# Patient Record
Sex: Female | Born: 1955
Health system: Southern US, Community
[De-identification: ages and names within clinical notes are randomized; demographics above are authoritative.]

## PROBLEM LIST (undated history)

## (undated) DIAGNOSIS — F32A Depression, unspecified: Secondary | ICD-10-CM

## (undated) DIAGNOSIS — I10 Essential (primary) hypertension: Secondary | ICD-10-CM

## (undated) DIAGNOSIS — E785 Hyperlipidemia, unspecified: Secondary | ICD-10-CM

## (undated) DIAGNOSIS — G8929 Other chronic pain: Secondary | ICD-10-CM

## (undated) DIAGNOSIS — M549 Dorsalgia, unspecified: Secondary | ICD-10-CM

## (undated) DIAGNOSIS — F329 Major depressive disorder, single episode, unspecified: Secondary | ICD-10-CM

## (undated) DIAGNOSIS — M542 Cervicalgia: Secondary | ICD-10-CM

## (undated) HISTORY — PX: BREAST CYST EXCISION: SHX579

## (undated) HISTORY — DX: Hyperlipidemia, unspecified: E78.5

## (undated) HISTORY — PX: TUBAL LIGATION: SHX77

## (undated) HISTORY — PX: COLONOSCOPY: SHX174

## (undated) HISTORY — DX: Major depressive disorder, single episode, unspecified: F32.9

## (undated) HISTORY — DX: Essential (primary) hypertension: I10

## (undated) HISTORY — DX: Depression, unspecified: F32.A

---

## 1998-12-04 ENCOUNTER — Emergency Department (HOSPITAL_COMMUNITY): Admission: EM | Admit: 1998-12-04 | Discharge: 1998-12-05 | Payer: Self-pay | Admitting: Emergency Medicine

## 2001-02-12 ENCOUNTER — Other Ambulatory Visit: Admission: RE | Admit: 2001-02-12 | Discharge: 2001-02-12 | Payer: Self-pay | Admitting: Family Medicine

## 2001-12-16 ENCOUNTER — Encounter: Payer: Self-pay | Admitting: Family Medicine

## 2001-12-16 ENCOUNTER — Ambulatory Visit (HOSPITAL_COMMUNITY): Admission: RE | Admit: 2001-12-16 | Discharge: 2001-12-16 | Payer: Self-pay | Admitting: Family Medicine

## 2002-02-04 ENCOUNTER — Emergency Department (HOSPITAL_COMMUNITY): Admission: EM | Admit: 2002-02-04 | Discharge: 2002-02-04 | Payer: Self-pay | Admitting: Emergency Medicine

## 2002-03-05 ENCOUNTER — Other Ambulatory Visit: Admission: RE | Admit: 2002-03-05 | Discharge: 2002-03-05 | Payer: Self-pay | Admitting: Obstetrics and Gynecology

## 2008-05-26 ENCOUNTER — Emergency Department (HOSPITAL_COMMUNITY): Admission: EM | Admit: 2008-05-26 | Discharge: 2008-05-26 | Payer: Self-pay | Admitting: Emergency Medicine

## 2008-07-26 ENCOUNTER — Other Ambulatory Visit: Admission: RE | Admit: 2008-07-26 | Discharge: 2008-07-26 | Payer: Self-pay | Admitting: Obstetrics and Gynecology

## 2008-08-08 ENCOUNTER — Ambulatory Visit (HOSPITAL_COMMUNITY): Admission: RE | Admit: 2008-08-08 | Discharge: 2008-08-08 | Payer: Self-pay | Admitting: Obstetrics and Gynecology

## 2009-07-11 ENCOUNTER — Emergency Department (HOSPITAL_COMMUNITY): Admission: EM | Admit: 2009-07-11 | Discharge: 2009-07-11 | Payer: Self-pay | Admitting: Emergency Medicine

## 2011-01-25 LAB — URINE MICROSCOPIC-ADD ON

## 2011-01-25 LAB — URINALYSIS, ROUTINE W REFLEX MICROSCOPIC
Bilirubin Urine: NEGATIVE
Glucose, UA: NEGATIVE mg/dL
Ketones, ur: NEGATIVE mg/dL
Nitrite: NEGATIVE
Protein, ur: NEGATIVE mg/dL
Specific Gravity, Urine: 1.01 (ref 1.005–1.030)
Urobilinogen, UA: 0.2 mg/dL (ref 0.0–1.0)
pH: 6 (ref 5.0–8.0)

## 2011-01-25 LAB — WET PREP, GENITAL: Trich, Wet Prep: NONE SEEN

## 2011-01-25 LAB — GC/CHLAMYDIA PROBE AMP, GENITAL: Chlamydia, DNA Probe: NEGATIVE

## 2011-01-25 LAB — URINE CULTURE

## 2011-03-05 NOTE — Op Note (Signed)
[name], [name]             ACCOUNT NO.:  [id number]   MEDICAL RECORD NO.:  [id number]          PATIENT TYPE:  AMB   LOCATION:  DAY                           FACILITY:  [facility]   PHYSICIAN:  Tilda Burrow, M.D. DATE OF BIRTH:  1956/05/08   DATE OF PROCEDURE:  DATE OF DISCHARGE:                               OPERATIVE REPORT   PREOPERATIVE DIAGNOSES:  1. Menorrhagia.  2. Anemia.   POSTOPERATIVE DIAGNOSES:  1. Menorrhagia.  2. Anemia.   PROCEDURES:  Hysteroscopy and endometrial ablation (No dilation and  curettage required)   SURGEON:  Tilda Burrow, MD   ASSISTANT:  None.   ANESTHESIA:  General with endotracheal intubation, Dillard Cannon.   COMPLICATIONS:  None.   FINDINGS:  Smooth atrophic endometrial cavity with no tissue  abnormalities suspected, prior endometrial biopsy benign through the  office details.   PROCEDURE IN DETAIL:  The patient was taken to the operating room,  prepped and draped for vaginal procedure.  Time-out was conducted in  standard fashion.  Cervix was grasped with single-tooth tenaculum.  Uterus sounded to 12 cm with the cervix dilated to 23-French allowing  introduction of the rigid 30-degree hysteroscope for endometrial  evaluation.  The patient had easy identification of both tubal ostia,  and the endometrial cavity was quite smooth.  The patient was on  progesterone to suppress the endometrium.   The Gynecare ThermaChoice III endometrial ablation device was inserted  and insufflated with 25 mL of D5W, and the 8-minute thermal the  coagulation sequence was activated with good satisfactory return of all  25 mL of fluid.  The 8-minute sequence was completed without difficulty  with good intrauterine pressures throughout.  We have  then placed paracervical block consisting of 17 mL of Marcaine with  epinephrine placed in the paracervical tissues and the patient then was  allowed to go to the recovery room in good condition.  Sponge  and needle  counts were correct.      Tilda Burrow, M.D.  Electronically Signed     JVF/MEDQ  D:  08/08/2008  T:  08/09/2008  Job:  [id number]   cc:   [facility]

## 2011-07-19 LAB — HEMOGLOBIN AND HEMATOCRIT, BLOOD: Hemoglobin: 11.2 — ABNORMAL LOW

## 2011-07-22 LAB — BASIC METABOLIC PANEL
BUN: 6
CO2: 23
Chloride: 110
Creatinine, Ser: 0.96
Glucose, Bld: 102 — ABNORMAL HIGH

## 2011-07-22 LAB — CBC
MCHC: 32.2
MCV: 78
Platelets: 355
RBC: 3.81 — ABNORMAL LOW
RDW: 16.7 — ABNORMAL HIGH

## 2011-07-22 LAB — HCG, QUANTITATIVE, PREGNANCY: hCG, Beta Chain, Quant, S: <2

## 2011-10-22 DIAGNOSIS — G8929 Other chronic pain: Secondary | ICD-10-CM

## 2011-10-22 HISTORY — DX: Other chronic pain: G89.29

## 2012-04-01 ENCOUNTER — Emergency Department (HOSPITAL_COMMUNITY): Payer: No Typology Code available for payment source

## 2012-04-01 ENCOUNTER — Encounter (HOSPITAL_COMMUNITY): Payer: Self-pay | Admitting: *Deleted

## 2012-04-01 ENCOUNTER — Emergency Department (HOSPITAL_COMMUNITY)
Admission: EM | Admit: 2012-04-01 | Discharge: 2012-04-01 | Disposition: A | Discharge status: Home / Self Care | Payer: No Typology Code available for payment source | Attending: Emergency Medicine | Admitting: Emergency Medicine

## 2012-04-01 DIAGNOSIS — M542 Cervicalgia: Secondary | ICD-10-CM | POA: Diagnosis not present

## 2012-04-01 DIAGNOSIS — Y92410 Unspecified street and highway as the place of occurrence of the external cause: Secondary | ICD-10-CM | POA: Insufficient documentation

## 2012-04-01 DIAGNOSIS — M549 Dorsalgia, unspecified: Secondary | ICD-10-CM | POA: Insufficient documentation

## 2012-04-01 DIAGNOSIS — Z043 Encounter for examination and observation following other accident: Secondary | ICD-10-CM | POA: Diagnosis not present

## 2012-04-01 MED ORDER — KETOROLAC TROMETHAMINE 60 MG/2ML IM SOLN
60.0000 mg | Freq: Once | INTRAMUSCULAR | Status: AC
  Administered 2012-04-01: 60 mg via INTRAMUSCULAR
  Filled 2012-04-01: qty 2

## 2012-04-01 MED ORDER — IBUPROFEN 800 MG PO TABS
800.0000 mg | ORAL_TABLET | Freq: Once | ORAL | Status: AC
  Administered 2012-04-01: 800 mg via ORAL
  Filled 2012-04-01: qty 1

## 2012-04-01 MED ORDER — NAPROXEN 375 MG PO TABS: 375.0000 mg | ORAL_TABLET | Freq: Two times a day (BID) | ORAL | Status: DC

## 2012-04-01 MED ORDER — ACETAMINOPHEN 325 MG PO TABS
650.0000 mg | ORAL_TABLET | Freq: Once | ORAL | Status: AC
  Administered 2012-04-01: 650 mg via ORAL
  Filled 2012-04-01: qty 2

## 2012-04-01 MED ORDER — CYCLOBENZAPRINE HCL 10 MG PO TABS: 10.0000 mg | ORAL_TABLET | Freq: Two times a day (BID) | ORAL | Status: AC | PRN
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
## 2012-04-01 NOTE — ED Notes (Signed)
C/o back of neck pain at this time, pt did not rate when asked

## 2012-04-01 NOTE — Discharge Instructions (Signed)
X-rays were normal.  You'll be sore for several days.  Medication for pain and muscle spasm. °
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
## 2012-04-16 ENCOUNTER — Ambulatory Visit (INDEPENDENT_AMBULATORY_CARE_PROVIDER_SITE_OTHER): Payer: Medicare Other | Admitting: Family Medicine

## 2012-04-16 ENCOUNTER — Encounter: Payer: Self-pay | Admitting: Family Medicine

## 2012-04-16 VITALS — BP 150/96 | HR 76 | Resp 16 | Ht 61.5 in | Wt 253.4 lb

## 2012-04-16 DIAGNOSIS — F329 Major depressive disorder, single episode, unspecified: Secondary | ICD-10-CM | POA: Insufficient documentation

## 2012-04-16 DIAGNOSIS — R030 Elevated blood-pressure reading, without diagnosis of hypertension: Secondary | ICD-10-CM | POA: Diagnosis not present

## 2012-04-16 DIAGNOSIS — E669 Obesity, unspecified: Secondary | ICD-10-CM

## 2012-04-16 DIAGNOSIS — F3289 Other specified depressive episodes: Secondary | ICD-10-CM

## 2012-04-16 DIAGNOSIS — S161XXA Strain of muscle, fascia and tendon at neck level, initial encounter: Secondary | ICD-10-CM | POA: Insufficient documentation

## 2012-04-16 DIAGNOSIS — M549 Dorsalgia, unspecified: Secondary | ICD-10-CM | POA: Insufficient documentation

## 2012-04-16 DIAGNOSIS — F32A Depression, unspecified: Secondary | ICD-10-CM

## 2012-04-16 DIAGNOSIS — I10 Essential (primary) hypertension: Secondary | ICD-10-CM | POA: Insufficient documentation

## 2012-04-16 MED ORDER — CYCLOBENZAPRINE HCL 10 MG PO TABS: 10.0000 mg | ORAL_TABLET | Freq: Two times a day (BID) | ORAL | Status: DC | PRN

## 2012-04-16 MED ORDER — CITALOPRAM HYDROBROMIDE 20 MG PO TABS: 20.0000 mg | ORAL_TABLET | Freq: Every day | ORAL | Status: DC

## 2012-04-16 MED ORDER — NAPROXEN 375 MG PO TABS: 375.0000 mg | ORAL_TABLET | Freq: Two times a day (BID) | ORAL | Status: DC
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
## 2012-04-16 NOTE — Assessment & Plan Note (Signed)
Cervical strain she has a lot of spasm and tightness in her neck. Per above with muscle relaxants. CT scan reviewed no acute findings. She may benefit from physical therapy

## 2012-04-16 NOTE — Assessment & Plan Note (Signed)
This is her first visit but it appears that her depression has deteriorated off her medication. We'll restart her on her Celexa 20 mg her records will be obtained.

## 2012-04-16 NOTE — Assessment & Plan Note (Signed)
Given handout on low calorie diet encouraged her to walk as much as possible.
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
## 2012-04-27 ENCOUNTER — Telehealth: Payer: Self-pay | Admitting: Family Medicine

## 2012-04-27 DIAGNOSIS — S161XXA Strain of muscle, fascia and tendon at neck level, initial encounter: Secondary | ICD-10-CM

## 2012-04-27 DIAGNOSIS — M549 Dorsalgia, unspecified: Secondary | ICD-10-CM

## 2012-04-27 NOTE — Telephone Encounter (Signed)
Referral placed.

## 2012-05-06 ENCOUNTER — Ambulatory Visit (HOSPITAL_COMMUNITY)
Admission: RE | Admit: 2012-05-06 | Discharge: 2012-05-06 | Disposition: A | Discharge status: Home / Self Care | Payer: Medicare Other | Source: Ambulatory Visit | Attending: Family Medicine | Admitting: Family Medicine

## 2012-05-06 DIAGNOSIS — R531 Weakness: Secondary | ICD-10-CM | POA: Insufficient documentation

## 2012-05-06 DIAGNOSIS — M545 Low back pain, unspecified: Secondary | ICD-10-CM | POA: Diagnosis not present

## 2012-05-06 DIAGNOSIS — R262 Difficulty in walking, not elsewhere classified: Secondary | ICD-10-CM | POA: Diagnosis not present

## 2012-05-06 DIAGNOSIS — M2560 Stiffness of unspecified joint, not elsewhere classified: Secondary | ICD-10-CM | POA: Insufficient documentation

## 2012-05-06 DIAGNOSIS — IMO0001 Reserved for inherently not codable concepts without codable children: Secondary | ICD-10-CM | POA: Insufficient documentation

## 2012-05-06 DIAGNOSIS — M25519 Pain in unspecified shoulder: Secondary | ICD-10-CM | POA: Insufficient documentation

## 2012-05-06 DIAGNOSIS — M542 Cervicalgia: Secondary | ICD-10-CM | POA: Insufficient documentation

## 2012-05-06 DIAGNOSIS — M6281 Muscle weakness (generalized): Secondary | ICD-10-CM | POA: Insufficient documentation
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
## 2012-05-12 ENCOUNTER — Ambulatory Visit (HOSPITAL_COMMUNITY)
Admission: RE | Admit: 2012-05-12 | Discharge: 2012-05-12 | Disposition: A | Discharge status: Home / Self Care | Payer: Medicare Other | Source: Ambulatory Visit | Attending: Family Medicine | Admitting: Family Medicine

## 2012-05-12 DIAGNOSIS — R531 Weakness: Secondary | ICD-10-CM

## 2012-05-12 DIAGNOSIS — M2560 Stiffness of unspecified joint, not elsewhere classified: Secondary | ICD-10-CM

## 2012-05-12 DIAGNOSIS — R262 Difficulty in walking, not elsewhere classified: Secondary | ICD-10-CM

## 2012-05-12 DIAGNOSIS — IMO0001 Reserved for inherently not codable concepts without codable children: Secondary | ICD-10-CM | POA: Diagnosis not present

## 2012-05-12 NOTE — Progress Notes (Signed)
Physical Therapy Treatment Patient Details  Name: Sheryl [name] MRN: [id number] Date of Birth: [birth year]  Today's Date: 05/12/2012 Time: 0800-0846 PT Time Calculation (min): 46 min  Visit#: 2  of 8   Re-eval: 06/05/12    Authorization: medicare  Authorization Time Period:    Authorization Visit#:   of     Subjective: Symptoms/Limitations Symptoms: Ms. [place] states she has been doing her exercises but not as often as she was suppose to. Pain Assessment Pain Score:   5 Pain Location: Neck Pain Orientation: Right;Left  Precautions/Restrictions     Exercise/Treatments     Aerobic Tread Mill: 1.0 x 5' UBE (Upper Arm Bike): 1.0x 3'    Standing Other Standing Lumbar Exercises: T-band posture green x 110for all postural ex  Seated Other Seated Lumbar Exercises: c-SB/Rot AROM Other Seated Lumbar Exercises: isometric sb/ext x 10; w-back x 10 Supine Ab Set: 10 reps Bent Knee Raise: 10 reps Bridge: 10 reps      Physical Therapy Assessment and Plan PT Assessment and Plan Clinical Impression Statement: Pt needed verbal and tactile cuing to properly stabilize both cervical and lumbar areas.  Added ex per doc flow sheet.  Rehab Potential: Good PT Plan: Begin heel raise, minisquat, hip isometric and x to v next treatment; 4th treatment add wall push up and floating SLR, 5th heelsqueeze and prone slr     Goals    Problem List Patient Active Problem List  Diagnosis  . Obesity, unspecified  . Depression  . Elevated blood pressure reading without diagnosis of hypertension  . Back pain  . Cervical strain, acute  . Difficulty in walking  . Stiffness of vertebral column  . Decreased strength    PT - End of Session Activity Tolerance: Patient tolerated treatment well General Behavior During Session: [facility] for tasks performed Cognition: [facility] for tasks performed PT Plan of Care PT Home Exercise Plan: given new exercises as well as ex to add for next  treatment Consulted and Agree with Plan of Care: Patient  GP    Rickardo Brinegar,CINDY 05/12/2012, 8:54 AM

## 2012-05-14 ENCOUNTER — Ambulatory Visit (HOSPITAL_COMMUNITY)
Admission: RE | Admit: 2012-05-14 | Discharge: 2012-05-14 | Disposition: A | Discharge status: Home / Self Care | Payer: Medicare Other | Source: Ambulatory Visit | Attending: Family Medicine | Admitting: Family Medicine

## 2012-05-14 DIAGNOSIS — IMO0001 Reserved for inherently not codable concepts without codable children: Secondary | ICD-10-CM | POA: Diagnosis not present

## 2012-05-14 NOTE — Progress Notes (Addendum)
Physical Therapy Treatment Patient Details  Name: Sheryl [name] MRN: [id number] Date of Birth: 10-04-1956  Today's Date: 05/14/2012 Time: 0805-0844 PT Time Calculation (min): 39 min  Visit#: 3  of 8   Re-eval: 06/05/12    Authorization:    Authorization Time Period:    Authorization Visit#: 2  of 8    Subjective: Symptoms/Limitations Symptoms: Ms. Casimir c/o of tingling at base of cervical area-superior to shoulders and LBP 6/10 pain "constant aching"  Precautions/Restrictions     Exercise/Treatments Mobility/Balance             Aerobic Tread Mill: 1.0 x 5' UBE (Upper Arm Bike): 1.0x 3' Machines for Strengthening   Standing Heel Raises: 10 reps Other Standing Lumbar Exercises: T-band posture green x 10 Other Standing Lumbar Exercises: MS x10 Seated Other Seated Lumbar Exercises: c-SB/Rot AROM Other Seated Lumbar Exercises: isometric sb/ext x 10; w-back x 10; X to V x10 Supine Ab Set: 10 reps Bent Knee Raise: 10 reps Bridge: 10 reps Isometric Hip Flexion: 10 reps     Physical Therapy Assessment and Plan PT Assessment and Plan Clinical Impression Statement: Pt cautious and wincing with pain frequently with LBP during treatment. Patient had reported she was having difficulty with bent knee raises at home; educated patient on importance of Ab set before  performing and this alleviated pain today with these exercises...  Added exercises tolerated well with verbal cueing needed to set Ab before all exercises. add wall push up and floating SLR, 5th heelsqueeze and prone slr    Goals    Problem List Patient Active Problem List  Diagnosis  . Obesity, unspecified  . Depression  . Elevated blood pressure reading without diagnosis of hypertension  . Back pain  . Cervical strain, acute  . Difficulty in walking  . Stiffness of vertebral column  . Decreased strength    PT - End of Session Activity Tolerance: Patient limited by pain General Behavior  During Session: [facility] for tasks performed Cognition: [facility] for tasks performed  GP    Martavia Tye ATKINSO 05/14/2012, 9:26 AM

## 2012-05-19 ENCOUNTER — Inpatient Hospital Stay (HOSPITAL_COMMUNITY): Admission: RE | Admit: 2012-05-19 | Payer: Medicare Other | Source: Ambulatory Visit | Admitting: Physical Therapy

## 2012-05-20 ENCOUNTER — Ambulatory Visit (HOSPITAL_COMMUNITY)
Admission: RE | Admit: 2012-05-20 | Discharge: 2012-05-20 | Disposition: A | Discharge status: Home / Self Care | Payer: Medicare Other | Source: Ambulatory Visit | Attending: Family Medicine | Admitting: Family Medicine

## 2012-05-20 DIAGNOSIS — IMO0001 Reserved for inherently not codable concepts without codable children: Secondary | ICD-10-CM | POA: Diagnosis not present

## 2012-05-20 NOTE — Progress Notes (Signed)
Physical Therapy Treatment Patient Details  Name: Sheryl [name] MRN: [id number] Date of Birth: 20-May-1956  Today's Date: 05/20/2012 Time: 7829-5621 PT Time Calculation (min): 42 min Charges:  therex 40' Visit#: 4  of 8   Re-eval: 06/05/12    Authorization: medicare  Authorization Time Period:    Authorization Visit#: 3  of 8    Subjective: Symptoms/Limitations Symptoms: Pt. states "it's not that bad", yet rated her cervical pain 8/10 today. Pain Assessment Currently in Pain?: Yes Pain Score:   8 Pain Location: Neck Pain Orientation: Right;Left Pain Radiating Towards: L shoulder   Exercise/Treatments Stretches Active Hamstring Stretch: 2 reps;30 seconds Aerobic Tread Mill: 6' @ 1.2 mph UBE (Upper Arm Bike): 4'@ 1.0 Standing Heel Raises: 15 reps;Limitations Heel Raises Limitations: Toeraises 15 reps Other Standing Lumbar Exercises: T-band postural 3;  green x 10 Seated Other Seated Lumbar Exercises: 10 reps Cervical-SB/Rot AROM Other Seated Lumbar Exercises: isometric sb/ext x 10; w-back x 10; X to V x10 Supine Ab Set: 10 reps Bent Knee Raise: 10 reps Bridge: 10 reps Straight Leg Raise: 5 reps Isometric Hip Flexion: 10 reps    Physical Therapy Assessment and Plan PT Assessment and Plan Clinical Impression Statement: Added hamstring stretch to POC with difficulty due to UE strength; Pt. requires cues to perform exercises slowly and controlled.  Pt. able to demonstrate logroll when transitioning supine to sit. PT Plan: Add wall push ups and  minisquats next visit.    Problem List Patient Active Problem List  Diagnosis  . Obesity, unspecified  . Depression  . Elevated blood pressure reading without diagnosis of hypertension  . Back pain  . Cervical strain, acute  . Difficulty in walking  . Stiffness of vertebral column  . Decreased strength    PT - End of Session Activity Tolerance: Patient tolerated treatment well General Behavior During Session:  [facility] for tasks performed Cognition: [facility] for tasks performed   [name], PTA/CLT 05/20/2012, 4:44 PM

## 2012-05-21 ENCOUNTER — Ambulatory Visit (HOSPITAL_COMMUNITY): Payer: Medicare Other | Admitting: *Deleted

## 2012-05-26 ENCOUNTER — Ambulatory Visit (HOSPITAL_COMMUNITY)
Admission: RE | Admit: 2012-05-26 | Discharge: 2012-05-26 | Disposition: A | Discharge status: Home / Self Care | Payer: Medicare Other | Source: Ambulatory Visit | Attending: Family Medicine | Admitting: Family Medicine

## 2012-05-26 DIAGNOSIS — IMO0001 Reserved for inherently not codable concepts without codable children: Secondary | ICD-10-CM | POA: Insufficient documentation

## 2012-05-26 DIAGNOSIS — M545 Low back pain, unspecified: Secondary | ICD-10-CM | POA: Insufficient documentation

## 2012-05-26 DIAGNOSIS — M542 Cervicalgia: Secondary | ICD-10-CM | POA: Diagnosis not present

## 2012-05-26 DIAGNOSIS — R262 Difficulty in walking, not elsewhere classified: Secondary | ICD-10-CM | POA: Diagnosis not present

## 2012-05-26 DIAGNOSIS — M25519 Pain in unspecified shoulder: Secondary | ICD-10-CM | POA: Insufficient documentation

## 2012-05-26 DIAGNOSIS — M6281 Muscle weakness (generalized): Secondary | ICD-10-CM | POA: Diagnosis not present

## 2012-05-26 NOTE — Progress Notes (Signed)
Physical Therapy Treatment Patient Details  Name: Sheryl [name] MRN: [id number] Date of Birth: [birth year]  Today's Date: 05/26/2012 Time: 0805-0845 PT Time Calculation (min): 40 min Visit#: 5  of 8   Re-eval: 06/05/12 Charges:  therex 38'    Authorization: medicare  Authorization Visit#: 5  of 8    Subjective: Symptoms/Limitations Symptoms: Pt. reports she is having no real pain today; neck and back doing great. Pain Assessment Currently in Pain?: No/denies   Exercise/Treatments Aerobic Tread Mill: 6' @ 1.2 mph UBE (Upper Arm Bike): 4'@ 1.0 Standing Heel Raises: 15 reps;Limitations Heel Raises Limitations: Toeraises 15 reps Wall Slides: Limitations Wall Slides Limitations: wall push ups 5 reps Other Standing Lumbar Exercises: T-band postural 3;  green x 12 Other Standing Lumbar Exercises: squats 10 reps Seated Other Seated Lumbar Exercises: 10 reps Cervical-SB/Rot AROM Other Seated Lumbar Exercises: isometric sb/ext x 10; w-back x 10; X to V x10 Supine Ab Set: 15 reps;Limitations AB Set Limitations: 5" holds Bent Knee Raise: 10 reps Bridge: 10 reps Straight Leg Raise: 5 reps Isometric Hip Flexion: 10 reps    Physical Therapy Assessment and Plan PT Assessment and Plan Clinical Impression Statement: Added wall push ups and minisquats; pt c/o of early fatigue at 5 reps with goal of 10.   continues to require verbal cues to perform exercises more slowly/controlled. PT Plan: Continue X 3 more visits; reassess end of next week.  Progress reps as able.     Problem List Patient Active Problem List  Diagnosis  . Obesity, unspecified  . Depression  . Elevated blood pressure reading without diagnosis of hypertension  . Back pain  . Cervical strain, acute  . Difficulty in walking  . Stiffness of vertebral column  . Decreased strength    PT - End of Session Activity Tolerance: Patient tolerated treatment well General Behavior During Session: [facility] for tasks  performed Cognition: [name] for tasks performed   [name], PTA/CLT 05/26/2012, 8:49 AM

## 2012-05-28 ENCOUNTER — Encounter: Payer: Self-pay | Admitting: Family Medicine

## 2012-05-28 ENCOUNTER — Ambulatory Visit (INDEPENDENT_AMBULATORY_CARE_PROVIDER_SITE_OTHER): Payer: Medicare Other | Admitting: Family Medicine

## 2012-05-28 VITALS — BP 140/96 | HR 69 | Resp 16 | Ht 61.5 in | Wt 252.4 lb

## 2012-05-28 DIAGNOSIS — F329 Major depressive disorder, single episode, unspecified: Secondary | ICD-10-CM | POA: Diagnosis not present

## 2012-05-28 DIAGNOSIS — S139XXA Sprain of joints and ligaments of unspecified parts of neck, initial encounter: Secondary | ICD-10-CM | POA: Diagnosis not present

## 2012-05-28 DIAGNOSIS — S161XXA Strain of muscle, fascia and tendon at neck level, initial encounter: Secondary | ICD-10-CM

## 2012-05-28 DIAGNOSIS — F3289 Other specified depressive episodes: Secondary | ICD-10-CM

## 2012-05-28 DIAGNOSIS — F32A Depression, unspecified: Secondary | ICD-10-CM

## 2012-05-28 DIAGNOSIS — I10 Essential (primary) hypertension: Secondary | ICD-10-CM | POA: Diagnosis not present

## 2012-05-28 LAB — COMPREHENSIVE METABOLIC PANEL
ALT: 17 U/L (ref 0–35)
Alkaline Phosphatase: 91 U/L (ref 39–117)
CO2: 29 mEq/L (ref 19–32)
Creat: 1.04 mg/dL (ref 0.50–1.10)
Total Bilirubin: 0.5 mg/dL (ref 0.3–1.2)

## 2012-05-28 LAB — CBC
HCT: 40.8 % (ref 36.0–46.0)
MCV: 84.6 fL (ref 78.0–100.0)
RDW: 13 % (ref 11.5–15.5)
WBC: 6 10*3/uL (ref 4.0–10.5)

## 2012-05-28 LAB — LIPID PANEL
HDL: 41 mg/dL (ref >39)
LDL Cholesterol: 125 mg/dL — ABNORMAL HIGH (ref 0–99)
Total CHOL/HDL Ratio: 4.4 Ratio

## 2012-05-28 LAB — TSH: TSH: 0.904 u[IU]/mL (ref 0.350–4.500)

## 2012-05-28 MED ORDER — HYDROCHLOROTHIAZIDE 12.5 MG PO TABS: 12.5000 mg | ORAL_TABLET | Freq: Every day | ORAL | Status: DC

## 2012-05-28 MED ORDER — NAPROXEN 375 MG PO TABS: 375.0000 mg | ORAL_TABLET | Freq: Two times a day (BID) | ORAL | Status: AC

## 2012-05-28 MED ORDER — CYCLOBENZAPRINE HCL 10 MG PO TABS: 10.0000 mg | ORAL_TABLET | Freq: Two times a day (BID) | ORAL | Status: DC | PRN

## 2012-05-28 NOTE — Patient Instructions (Signed)
Start blood pressure medication once a  Day in morning Continue other medications Take Celexa every day Labs to be done today F/U 4 weeks

## 2012-05-28 NOTE — Progress Notes (Signed)
  Subjective:    Patient ID: Sheryl [name], female    DOB: 23-Apr-1956, 56 y.o.   MRN: [id number]  HPI Pt here to f/u blood pressure and depression. She has not been taking celexa as prescribed, her mood is low and she is worried because son is leaving for college and there are forms to fill out and she does not understand them, she will be meeting with his college counselor to fill these out. No SI, no hallucinations.    Review of Systems   GEN- denies fatigue, fever, weight loss,weakness, recent illness HEENT- denies eye drainage, change in vision, nasal discharge, CVS- denies chest pain, palpitations RESP- denies SOB, cough, wheeze ABD- denies N/V, change in stools, abd pain GU- denies dysuria, hematuria, dribbling, incontinence MSK- + joint pain, muscle aches, injury Neuro- denies headache, dizziness, syncope, seizure activity      Objective:   Physical Exam GEN- NAD, alert and oriented x3 CVS- RRR, no murmur RESP-CTAB EXT- No edema Pulses- Radial, DP- 2+ Psych- depressed affect, crying during exam, not overly anxious, no hallucinations, no apparent SI       Assessment & Plan:

## 2012-05-29 ENCOUNTER — Ambulatory Visit (HOSPITAL_COMMUNITY): Payer: Medicare Other | Admitting: Physical Therapy

## 2012-05-31 NOTE — Assessment & Plan Note (Addendum)
Start HCTZ daily, recheck in 4 weeks, labs to be obtained today

## 2012-05-31 NOTE — Assessment & Plan Note (Signed)
Given reassurance about the school forms, she is to take celexa daily to help mood, f/u 4 weeks

## 2012-05-31 NOTE — Assessment & Plan Note (Signed)
Improved, continue PT

## 2012-06-02 ENCOUNTER — Inpatient Hospital Stay (HOSPITAL_COMMUNITY): Admission: RE | Admit: 2012-06-02 | Payer: Medicare Other | Source: Ambulatory Visit

## 2012-06-04 ENCOUNTER — Ambulatory Visit (HOSPITAL_COMMUNITY): Payer: Medicare Other | Admitting: *Deleted

## 2012-06-09 ENCOUNTER — Ambulatory Visit (HOSPITAL_COMMUNITY)
Admission: RE | Admit: 2012-06-09 | Payer: Medicare Other | Source: Ambulatory Visit | Attending: Family Medicine | Admitting: Family Medicine

## 2012-07-02 ENCOUNTER — Ambulatory Visit: Payer: Medicare Other | Admitting: Family Medicine

## 2012-07-02 ENCOUNTER — Encounter: Payer: Self-pay | Admitting: Family Medicine

## 2012-07-09 ENCOUNTER — Other Ambulatory Visit: Payer: Self-pay | Admitting: Family Medicine

## 2012-08-26 ENCOUNTER — Telehealth: Payer: Self-pay | Admitting: Family Medicine

## 2012-08-26 NOTE — Telephone Encounter (Signed)
Phone out of service.

## 2012-08-27 NOTE — Telephone Encounter (Signed)
Called number again today and its still out of service

## 2012-08-27 NOTE — Telephone Encounter (Signed)
Phone still out of service

## 2013-02-12 ENCOUNTER — Telehealth: Payer: Self-pay | Admitting: Family Medicine

## 2013-02-12 NOTE — Telephone Encounter (Signed)
Are you saying she wants her whole medical record. Has to sign release

## 2013-06-11 ENCOUNTER — Emergency Department (HOSPITAL_COMMUNITY)
Admission: EM | Admit: 2013-06-11 | Discharge: 2013-06-11 | Disposition: A | Discharge status: Home / Self Care | Payer: Medicare Other | Attending: Emergency Medicine | Admitting: Emergency Medicine

## 2013-06-11 ENCOUNTER — Encounter (HOSPITAL_COMMUNITY): Payer: Self-pay

## 2013-06-11 DIAGNOSIS — R300 Dysuria: Secondary | ICD-10-CM | POA: Diagnosis not present

## 2013-06-11 DIAGNOSIS — Y929 Unspecified place or not applicable: Secondary | ICD-10-CM | POA: Insufficient documentation

## 2013-06-11 DIAGNOSIS — R350 Frequency of micturition: Secondary | ICD-10-CM | POA: Insufficient documentation

## 2013-06-11 DIAGNOSIS — F3289 Other specified depressive episodes: Secondary | ICD-10-CM | POA: Insufficient documentation

## 2013-06-11 DIAGNOSIS — F329 Major depressive disorder, single episode, unspecified: Secondary | ICD-10-CM | POA: Insufficient documentation

## 2013-06-11 DIAGNOSIS — R197 Diarrhea, unspecified: Secondary | ICD-10-CM | POA: Insufficient documentation

## 2013-06-11 DIAGNOSIS — Y939 Activity, unspecified: Secondary | ICD-10-CM | POA: Insufficient documentation

## 2013-06-11 DIAGNOSIS — T6391XA Toxic effect of contact with unspecified venomous animal, accidental (unintentional), initial encounter: Secondary | ICD-10-CM | POA: Insufficient documentation

## 2013-06-11 DIAGNOSIS — N390 Urinary tract infection, site not specified: Secondary | ICD-10-CM | POA: Diagnosis not present

## 2013-06-11 DIAGNOSIS — T63461A Toxic effect of venom of wasps, accidental (unintentional), initial encounter: Secondary | ICD-10-CM | POA: Insufficient documentation

## 2013-06-11 LAB — URINALYSIS, ROUTINE W REFLEX MICROSCOPIC
Bilirubin Urine: NEGATIVE
Ketones, ur: NEGATIVE mg/dL
Nitrite: NEGATIVE
Specific Gravity, Urine: >1.03 — ABNORMAL HIGH (ref 1.005–1.030)
Urobilinogen, UA: 0.2 mg/dL (ref 0.0–1.0)

## 2013-06-11 MED ORDER — DIPHENHYDRAMINE HCL 25 MG PO CAPS
25.0000 mg | ORAL_CAPSULE | Freq: Once | ORAL | Status: AC
  Administered 2013-06-11: 25 mg via ORAL
  Filled 2013-06-11: qty 1

## 2013-06-11 MED ORDER — CEPHALEXIN 500 MG PO CAPS: 500.0000 mg | ORAL_CAPSULE | Freq: Four times a day (QID) | ORAL | Status: DC

## 2013-06-11 NOTE — ED Notes (Signed)
Pt states she was mowing last night and got stung by bees

## 2013-06-11 NOTE — ED Provider Notes (Signed)
CSN: [id number]     Arrival date & time 06/11/13  4098 History    This chart was scribed for Joya Gaskins, MD by Blanchard Kelch, ED Scribe. The patient was seen in room APA01/APA01. Patient's care was started at 9:39 AM.    Chief Complaint  Patient presents with  . Insect Bite    The history is provided by the patient. No language interpreter was used.    HPI Comments: Sheryl [name] is a 57 y.o. female who presents to the Emergency Department complaining of a bee sting that occurred yesterday on right knee, left eye, left upper abdomen. Patient applied vinegar to bee stings without relief. She denies associated lip or tongue swelling, syncope or vomiting. Patient denies taking any medication for stings. Patient also complains of dysuria, urinary frequency and diarrhea recently.   Past Medical History  Diagnosis Date  . Depression    Past Surgical History  Procedure Laterality Date  . Breast cyst excision      right  . Tubal ligation     Family History  Problem Relation Age of Onset  . Hypertension Mother   . Depression Mother   . Hypertension Sister   . Hypertension Sister    History  Substance Use Topics  . Smoking status: Never Smoker   . Smokeless tobacco: Not on file  . Alcohol Use: No   OB History   Grav Para Term Preterm Abortions TAB SAB Ect Mult Living                 Review of Systems  Constitutional: Negative for fever.  Respiratory: Negative for shortness of breath.     Allergies  Review of patient's allergies indicates no known allergies.  Home Medications   Current Outpatient Rx  Name  Route  Sig  Dispense  Refill  . citalopram (CELEXA) 20 MG tablet   Oral   Take 1 tablet (20 mg total) by mouth daily.   30 tablet   3   . cyclobenzaprine (FLEXERIL) 10 MG tablet   Oral   Take 1 tablet (10 mg total) by mouth 2 (two) times daily as needed.   45 tablet   1   . hydrochlorothiazide (HYDRODIURIL) 12.5 MG tablet      TAKE 1 TABLET BY  MOUTH EVERY DAY   30 tablet   6    Triage Vitals: BP 146/73  Pulse 86  Temp(Src) 97.8 F (36.6 C) (Oral)  Resp 20  Ht 5\' 2"  (1.575 m)  Wt 278 lb (126.1 kg)  BMI 50.83 kg/m2  SpO2 98%  Physical Exam  CONSTITUTIONAL: Well developed/well nourished HEAD: Normocephalic/atraumatic EYES: EOMI/PERRL ENMT: Mucous membranes moist. No angiodema NECK: supple no meningeal signs CV: S1/S2 noted, no murmurs/rubs/gallops noted LUNGS: Lungs are clear to auscultation bilaterally, no apparent distress ABDOMEN: soft, nontender, no rebound or guarding GU:no cva tenderness NEURO: Pt is awake/alert, moves all extremitiesx4 EXTREMITIES: pulses normal, full ROM SKIN: warm, color normal. Small area of erythema to abdomen.  PSYCH: no abnormalities of mood noted   ED Course   DIAGNOSTIC STUDIES:  Oxygen Saturation is 98% on room air, normal by my interpretation.    COORDINATION OF CARE:  9:42 AM - Patient verbalizes understanding and agrees with treatment plan. uti noted will start keflex No signs of systemic allergic reaction.  She requested a dose of benadryl here   Procedures     MDM  Nursing notes including past medical history and social history reviewed and  considered in documentation Labs/vital reviewed and considered    I personally performed the services described in this documentation, which was scribed in my presence. The recorded information has been reviewed and is accurate.      Joya Gaskins, MD 06/11/13 [phone]

## 2013-07-05 ENCOUNTER — Encounter (HOSPITAL_COMMUNITY): Payer: Self-pay

## 2013-07-05 ENCOUNTER — Emergency Department (HOSPITAL_COMMUNITY)
Admission: EM | Admit: 2013-07-05 | Discharge: 2013-07-05 | Disposition: A | Discharge status: Home / Self Care | Payer: Medicare Other | Attending: Emergency Medicine | Admitting: Emergency Medicine

## 2013-07-05 ENCOUNTER — Emergency Department (HOSPITAL_COMMUNITY): Payer: Medicare Other

## 2013-07-05 DIAGNOSIS — J069 Acute upper respiratory infection, unspecified: Secondary | ICD-10-CM | POA: Diagnosis not present

## 2013-07-05 DIAGNOSIS — J029 Acute pharyngitis, unspecified: Secondary | ICD-10-CM | POA: Insufficient documentation

## 2013-07-05 DIAGNOSIS — M549 Dorsalgia, unspecified: Secondary | ICD-10-CM | POA: Diagnosis not present

## 2013-07-05 DIAGNOSIS — Z8659 Personal history of other mental and behavioral disorders: Secondary | ICD-10-CM | POA: Diagnosis not present

## 2013-07-05 DIAGNOSIS — R509 Fever, unspecified: Secondary | ICD-10-CM | POA: Insufficient documentation

## 2013-07-05 DIAGNOSIS — J209 Acute bronchitis, unspecified: Secondary | ICD-10-CM | POA: Insufficient documentation

## 2013-07-05 DIAGNOSIS — Z792 Long term (current) use of antibiotics: Secondary | ICD-10-CM | POA: Insufficient documentation

## 2013-07-05 DIAGNOSIS — R05 Cough: Secondary | ICD-10-CM | POA: Diagnosis not present

## 2013-07-05 DIAGNOSIS — R062 Wheezing: Secondary | ICD-10-CM | POA: Diagnosis not present

## 2013-07-05 DIAGNOSIS — IMO0001 Reserved for inherently not codable concepts without codable children: Secondary | ICD-10-CM | POA: Insufficient documentation

## 2013-07-05 DIAGNOSIS — J40 Bronchitis, not specified as acute or chronic: Secondary | ICD-10-CM

## 2013-07-05 MED ORDER — IPRATROPIUM BROMIDE 0.02 % IN SOLN
0.5000 mg | Freq: Once | RESPIRATORY_TRACT | Status: AC
  Administered 2013-07-05: 0.5 mg via RESPIRATORY_TRACT
  Filled 2013-07-05: qty 2.5

## 2013-07-05 MED ORDER — HYDROCOD POLST-CHLORPHEN POLST 10-8 MG/5ML PO LQCR: 5.0000 mL | Freq: Two times a day (BID) | ORAL | Status: DC | PRN

## 2013-07-05 MED ORDER — ALBUTEROL SULFATE (5 MG/ML) 0.5% IN NEBU
5.0000 mg | INHALATION_SOLUTION | Freq: Once | RESPIRATORY_TRACT | Status: AC
  Administered 2013-07-05: 5 mg via RESPIRATORY_TRACT
  Filled 2013-07-05: qty 1

## 2013-07-05 MED ORDER — ALBUTEROL SULFATE HFA 108 (90 BASE) MCG/ACT IN AERS
2.0000 | INHALATION_SPRAY | Freq: Once | RESPIRATORY_TRACT | Status: AC
  Administered 2013-07-05: 2 via RESPIRATORY_TRACT
  Filled 2013-07-05: qty 6.7

## 2013-07-05 MED ORDER — HYDROCOD POLST-CHLORPHEN POLST 10-8 MG/5ML PO LQCR
5.0000 mL | Freq: Once | ORAL | Status: AC
  Administered 2013-07-05: 5 mL via ORAL
  Filled 2013-07-05: qty 5

## 2013-07-05 MED ORDER — AZITHROMYCIN 250 MG PO TABS: ORAL_TABLET | ORAL | Status: DC

## 2013-07-05 NOTE — ED Provider Notes (Signed)
CSN: [id number]     Arrival date & time 07/05/13  7829 History   First MD Initiated Contact with Patient 07/05/13 [phone]     Chief Complaint  Patient presents with  . URI   (Consider location/radiation/quality/duration/timing/severity/associated sxs/prior Treatment) Patient is a 57 y.o. female presenting with URI. The history is provided by the patient.  URI Presenting symptoms: congestion, cough, fever, rhinorrhea and sore throat   Presenting symptoms: no ear pain, no facial pain and no fatigue   Congestion:    Location:  Nasal and chest   Interferes with sleep: no     Interferes with eating/drinking: no   Cough:    Cough characteristics:  Productive   Sputum characteristics:  White   Severity:  Mild   Onset quality:  Gradual   Duration:  2 days   Timing:  Constant   Progression:  Waxing and waning   Chronicity:  New Fever:    Duration:  12 hours   Timing:  Intermittent   Temp source:  Subjective   Progression:  Improving Rhinorrhea:    Quality:  Clear   Severity:  Mild   Timing:  Intermittent   Progression:  Unchanged Severity:  Mild Onset quality:  Gradual Timing:  Constant Progression:  Unchanged Chronicity:  New Relieved by:  Nothing Worsened by:  Nothing tried Ineffective treatments:  OTC medications Associated symptoms: myalgias and wheezing   Associated symptoms: no arthralgias, no headaches, no neck pain, no sinus pain, no sneezing and no swollen glands   Associated symptoms comment:  Mid to lower back pain when coughing, resolves with rest Myalgias:    Location:  Back   Quality:  Aching   Severity:  Mild   Onset quality:  Gradual   Duration:  1 day Risk factors: no chronic cardiac disease, no chronic kidney disease, no chronic respiratory disease and no diabetes mellitus     Past Medical History  Diagnosis Date  . Depression    Past Surgical History  Procedure Laterality Date  . Breast cyst excision      right  . Tubal ligation     Family  History  Problem Relation Age of Onset  . Hypertension Mother   . Depression Mother   . Hypertension Sister   . Hypertension Sister    History  Substance Use Topics  . Smoking status: Never Smoker   . Smokeless tobacco: Not on file  . Alcohol Use: No   OB History   Grav Para Term Preterm Abortions TAB SAB Ect Mult Living                 Review of Systems  Constitutional: Positive for fever. Negative for chills, activity change, appetite change and fatigue.  HENT: Positive for congestion, sore throat and rhinorrhea. Negative for ear pain, facial swelling, sneezing, trouble swallowing, neck pain and neck stiffness.   Eyes: Negative for visual disturbance.  Respiratory: Positive for cough and wheezing. Negative for chest tightness, shortness of breath and stridor.   Cardiovascular: Negative for chest pain, palpitations and leg swelling.  Gastrointestinal: Negative for nausea and vomiting.  Genitourinary: Negative for dysuria, hematuria and flank pain.  Musculoskeletal: Positive for myalgias and back pain. Negative for arthralgias.  Skin: Negative.  Negative for rash.  Neurological: Negative for dizziness, weakness, numbness and headaches.  Hematological: Negative for adenopathy.  Psychiatric/Behavioral: Negative for confusion.  All other systems reviewed and are negative.    Allergies  Review of patient's allergies indicates no known allergies.  Home Medications   Current Outpatient Rx  Name  Route  Sig  Dispense  Refill  . Aspirin-Salicylamide-Caffeine (BC HEADACHE POWDER PO)   Oral   Take 1 packet by mouth daily as needed. Patient takes for headaches         . cephALEXin (KEFLEX) 500 MG capsule   Oral   Take 1 capsule (500 mg total) by mouth 4 (four) times daily.   28 capsule   0    BP 139/85  Pulse 95  Temp(Src) 98.7 F (37.1 C) (Oral)  Resp 22  Ht 5\' 2"  (1.575 m)  Wt 270 lb (122.471 kg)  BMI 49.37 kg/m2  SpO2 98%  LMP 12/20/2011 Physical Exam   Nursing note and vitals reviewed. Constitutional: She is oriented to person, place, and time. She appears well-developed and well-nourished. No distress.  HENT:  Head: Normocephalic and atraumatic.  Right Ear: Tympanic membrane and ear canal normal.  Left Ear: Tympanic membrane and ear canal normal.  Nose: Mucosal edema and rhinorrhea present.  Mouth/Throat: Uvula is midline and mucous membranes are normal. Posterior oropharyngeal erythema present. No oropharyngeal exudate, posterior oropharyngeal edema or tonsillar abscesses.  Eyes: EOM are normal. Pupils are equal, round, and reactive to light.  Neck: Normal range of motion, full passive range of motion without pain and phonation normal. Neck supple.  Cardiovascular: Normal rate, regular rhythm, normal heart sounds and intact distal pulses.   No murmur heard. Pulmonary/Chest: Effort normal. No stridor. No respiratory distress. She has no rales. She exhibits no tenderness.  Coarse lungs sounds bilaterally, with scattered inspiratory wheezes bilaterally. No rales or rhonchi  Abdominal: Soft. She exhibits no distension. There is no tenderness. There is no rebound.  Musculoskeletal: Normal range of motion. She exhibits no edema.  Lymphadenopathy:    She has no cervical adenopathy.  Neurological: She is alert and oriented to person, place, and time. She exhibits normal muscle tone. Coordination normal.  Skin: Skin is warm and dry.    ED Course  Procedures (including critical care time) Labs Review Labs Reviewed - No data to display Imaging Review Dg Chest 2 View  07/05/2013   *RADIOLOGY REPORT*  Clinical Data: Fever.  Cough.  Nonsmoker.  CHEST - 2 VIEW  Comparison: 01/21/2004.  Findings: Nonspecific small radiopaque structure peripheral aspect right mid to lower lung zone unchanged from prior exam.  No infiltrate, congestive heart failure or pneumothorax.  Heart slightly enlarged.  Mildly tortuous aorta.  IMPRESSION: Nonspecific small  radiopaque structure peripheral aspect right mid to lower lung zone unchanged from prior exam.  No infiltrate.  Heart slightly enlarged.  Mildly tortuous aorta.   Original Report Authenticated By: Lacy Duverney, M.D.    MDM   Patient is feeling better after albuterol/Atrovent neb.  No tachycardia, tachypnea, hypoxia , chest pain or dyspnea. Scattered inspiratory wheezing has resolved since neb .  Patient has cough with URI symptoms as well. Clinical suspicion for PE is low.  Patient agrees to close followup this week with her primary care physician and advised to return here if her symptoms worsen.  Vital signs are stable, she appears stable for discharge at this time.   Estera Ozier L. Trisha Mangle, PA-C 07/05/13 1610

## 2013-07-05 NOTE — ED Notes (Signed)
Pt calling her daughter to pick her up  

## 2013-07-05 NOTE — ED Notes (Signed)
Pt c/o productive cough and congestion x 2 days.  Reports fever yesterday.  Pt also c/o pain in back with coughing.  Has been taking robitussin, none today.

## 2013-07-06 NOTE — ED Provider Notes (Signed)
Medical screening examination/treatment/procedure(s) were performed by non-physician practitioner and as supervising physician I was immediately available for consultation/collaboration.    Vida Roller, MD 07/06/13 [phone]

## 2013-07-21 ENCOUNTER — Emergency Department (HOSPITAL_COMMUNITY)
Admission: EM | Admit: 2013-07-21 | Discharge: 2013-07-21 | Disposition: A | Discharge status: Home / Self Care | Payer: Medicare Other | Attending: Emergency Medicine | Admitting: Emergency Medicine

## 2013-07-21 ENCOUNTER — Emergency Department (HOSPITAL_COMMUNITY): Payer: Medicare Other

## 2013-07-21 ENCOUNTER — Encounter (HOSPITAL_COMMUNITY): Payer: Self-pay

## 2013-07-21 ENCOUNTER — Other Ambulatory Visit: Payer: Self-pay

## 2013-07-21 DIAGNOSIS — Z792 Long term (current) use of antibiotics: Secondary | ICD-10-CM | POA: Insufficient documentation

## 2013-07-21 DIAGNOSIS — Z0389 Encounter for observation for other suspected diseases and conditions ruled out: Secondary | ICD-10-CM | POA: Diagnosis not present

## 2013-07-21 DIAGNOSIS — R209 Unspecified disturbances of skin sensation: Secondary | ICD-10-CM | POA: Insufficient documentation

## 2013-07-21 DIAGNOSIS — M546 Pain in thoracic spine: Secondary | ICD-10-CM | POA: Insufficient documentation

## 2013-07-21 DIAGNOSIS — M549 Dorsalgia, unspecified: Secondary | ICD-10-CM

## 2013-07-21 DIAGNOSIS — R079 Chest pain, unspecified: Secondary | ICD-10-CM | POA: Diagnosis not present

## 2013-07-21 DIAGNOSIS — F329 Major depressive disorder, single episode, unspecified: Secondary | ICD-10-CM | POA: Insufficient documentation

## 2013-07-21 DIAGNOSIS — F3289 Other specified depressive episodes: Secondary | ICD-10-CM | POA: Insufficient documentation

## 2013-07-21 LAB — COMPREHENSIVE METABOLIC PANEL
Albumin: 3.5 g/dL (ref 3.5–5.2)
Alkaline Phosphatase: 108 U/L (ref 39–117)
BUN: 12 mg/dL (ref 6–23)
CO2: 27 mEq/L (ref 19–32)
Calcium: 9.7 mg/dL (ref 8.4–10.5)
Chloride: 101 mEq/L (ref 96–112)
Creatinine, Ser: 1.09 mg/dL (ref 0.50–1.10)
GFR calc Af Amer: 65 mL/min — ABNORMAL LOW (ref >90)
GFR calc non Af Amer: 56 mL/min — ABNORMAL LOW (ref >90)
Glucose, Bld: 131 mg/dL — ABNORMAL HIGH (ref 70–99)
Total Bilirubin: 0.2 mg/dL — ABNORMAL LOW (ref 0.3–1.2)

## 2013-07-21 LAB — CBC WITH DIFFERENTIAL/PLATELET
Basophils Absolute: 0 10*3/uL (ref 0.0–0.1)
Basophils Relative: 0 % (ref 0–1)
Eosinophils Relative: 1 % (ref 0–5)
HCT: 40.1 % (ref 36.0–46.0)
MCHC: 32.9 g/dL (ref 30.0–36.0)
MCV: 88.1 fL (ref 78.0–100.0)
Monocytes Absolute: 0.5 10*3/uL (ref 0.1–1.0)
RDW: 13.2 % (ref 11.5–15.5)

## 2013-07-21 LAB — TROPONIN I: Troponin I: <0.3 ng/mL (ref <0.30)

## 2013-07-21 MED ORDER — HYDROMORPHONE HCL PF 1 MG/ML IJ SOLN
1.0000 mg | Freq: Once | INTRAMUSCULAR | Status: AC
  Administered 2013-07-21: 1 mg via INTRAVENOUS
  Filled 2013-07-21: qty 1

## 2013-07-21 MED ORDER — IBUPROFEN 800 MG PO TABS: 800.0000 mg | ORAL_TABLET | Freq: Three times a day (TID) | ORAL | Status: DC

## 2013-07-21 MED ORDER — TRAMADOL HCL 50 MG PO TABS: 50.0000 mg | ORAL_TABLET | Freq: Four times a day (QID) | ORAL | Status: DC | PRN

## 2013-07-21 MED ORDER — ONDANSETRON HCL 4 MG/2ML IJ SOLN
4.0000 mg | Freq: Once | INTRAMUSCULAR | Status: AC
  Administered 2013-07-21: 4 mg via INTRAVENOUS
  Filled 2013-07-21: qty 2

## 2013-07-21 NOTE — ED Notes (Signed)
Pt reports back pain for over a week, tonight is having tingling in left arm and also in left leg.  Pain and tingling are worse with any movement.

## 2013-07-21 NOTE — ED Provider Notes (Signed)
CSN: [id number]     Arrival date & time 07/21/13  0138 History   First MD Initiated Contact with Patient 07/21/13 0202     Chief Complaint  Patient presents with  . Back Pain  . arm tingling    (Consider location/radiation/quality/duration/timing/severity/associated sxs/prior Treatment) Patient is a 57 y.o. female presenting with back pain. The history is provided by the patient (the pt complains of pain in the left upper back).  Back Pain Location:  Generalized Quality:  Aching Radiates to:  L shoulder Pain severity:  Moderate Pain is:  Same all the time Onset quality:  Gradual Timing:  Constant Associated symptoms: no abdominal pain, no chest pain and no headaches     Past Medical History  Diagnosis Date  . Depression    Past Surgical History  Procedure Laterality Date  . Breast cyst excision      right  . Tubal ligation     Family History  Problem Relation Age of Onset  . Hypertension Mother   . Depression Mother   . Hypertension Sister   . Hypertension Sister    History  Substance Use Topics  . Smoking status: Never Smoker   . Smokeless tobacco: Not on file  . Alcohol Use: No   OB History   Grav Para Term Preterm Abortions TAB SAB Ect Mult Living                 Review of Systems  Constitutional: Negative for appetite change and fatigue.  HENT: Negative for congestion, sinus pressure and ear discharge.   Eyes: Negative for discharge.  Respiratory: Negative for cough.   Cardiovascular: Negative for chest pain.  Gastrointestinal: Negative for abdominal pain and diarrhea.  Genitourinary: Negative for frequency and hematuria.  Musculoskeletal: Positive for back pain.  Skin: Negative for rash.  Neurological: Negative for seizures and headaches.  Psychiatric/Behavioral: Negative for hallucinations.    Allergies  Review of patient's allergies indicates no known allergies.  Home Medications   Current Outpatient Rx  Name  Route  Sig  Dispense  Refill   . azithromycin (ZITHROMAX Z-PAK) 250 MG tablet      Take two tablets on day one, then one tab qd days 2-5   6 tablet   0   . chlorpheniramine-HYDROcodone (TUSSIONEX PENNKINETIC ER) 10-8 MG/5ML LQCR   Oral   Take 5 mLs by mouth every 12 (twelve) hours as needed.   60 mL   0   . ibuprofen (ADVIL,MOTRIN) 800 MG tablet   Oral   Take 1 tablet (800 mg total) by mouth 3 (three) times daily.   21 tablet   0   . traMADol (ULTRAM) 50 MG tablet   Oral   Take 1 tablet (50 mg total) by mouth every 6 (six) hours as needed for pain.   30 tablet   0    BP 125/67  Resp 19  SpO2 98%  LMP 12/20/2011 Physical Exam  Constitutional: She is oriented to person, place, and time. She appears well-developed.  HENT:  Head: Normocephalic.  Eyes: Conjunctivae and EOM are normal. No scleral icterus.  Neck: Neck supple. No thyromegaly present.  Cardiovascular: Normal rate and regular rhythm.  Exam reveals no gallop and no friction rub.   No murmur heard. Pulmonary/Chest: No stridor. She has no wheezes. She has no rales. She exhibits no tenderness.  Abdominal: She exhibits no distension. There is no tenderness. There is no rebound.  Musculoskeletal: Normal range of motion. She exhibits no edema.  Tender left trapezius muscle  Lymphadenopathy:    She has no cervical adenopathy.  Neurological: She is oriented to person, place, and time. She exhibits normal muscle tone. Coordination normal.  Skin: No rash noted. No erythema.  Psychiatric: She has a normal mood and affect. Her behavior is normal.    ED Course  Procedures (including critical care time) Labs Review Labs Reviewed  COMPREHENSIVE METABOLIC PANEL - Abnormal; Notable for the following:    Glucose, Bld 131 (*)    Total Bilirubin 0.2 (*)    GFR calc non Af Amer 56 (*)    GFR calc Af Amer 65 (*)    All other components within normal limits  CBC WITH DIFFERENTIAL  TROPONIN I   Imaging Review Dg Chest 2 View  07/21/2013   *RADIOLOGY  REPORT*  Clinical Data: Back pain, chest pain.  CHEST - 2 VIEW  Comparison: Chest radiograph July 05, 2013.  Findings: The cardiac silhouette is upper limits of normal, the mediastinal silhouette is unremarkable.  The lungs are clear without pleural effusions or focal consolidations.  The pulmonary vasculature is unremarkable.   Trachea projects midline and there is no pneumothorax.  8 mm ovoid calcific density and right midlung zone periphery may reflect a bone island and is not well localized on the lateral radiograph.  The included soft tissue planes and osseous structures are non suspicious, large body habitus.  IMPRESSION: Borderline cardiomegaly, no acute pulmonary process.  Stable appearance of chest from July 05, 2013.   Original Report Authenticated By: Awilda Metro   Date: 07/21/2013  Rate: 89  Rhythm: normal sinus rhythm  QRS Axis: normal  Intervals: normal  ST/T Wave abnormalities: normal  Conduction Disutrbances:none  Narrative Interpretation:   Old EKG Reviewed: none available    MDM   1. Back pain        Benny Lennert, MD 07/21/13 [phone]

## 2013-07-21 NOTE — ED Notes (Signed)
Patient states that she started having back pain yesterday, denies any injury, states that tonight she started having tingling in her left arm and down into her left leg. Patient states that it hurts when she moves her left shoulder around, tender when you push on her back and shoulder. Denies hurting arm at this time. Family is at bedside. Patient medicated, will reassess to see if that helped the pain. Also started c/o some pressure to chest, ekg has already been completed. States that she was treated last week for bronchitis, was given a zpak which she has already completed.

## 2013-07-21 NOTE — ED Notes (Signed)
Patient denies pain and is resting comfortably.  

## 2014-03-02 ENCOUNTER — Emergency Department (HOSPITAL_COMMUNITY): Payer: Medicare Other

## 2014-03-02 ENCOUNTER — Emergency Department (HOSPITAL_COMMUNITY)
Admission: EM | Admit: 2014-03-02 | Discharge: 2014-03-02 | Disposition: A | Discharge status: Home / Self Care | Payer: Medicare Other | Attending: Emergency Medicine | Admitting: Emergency Medicine

## 2014-03-02 ENCOUNTER — Encounter (HOSPITAL_COMMUNITY): Payer: Self-pay | Admitting: Emergency Medicine

## 2014-03-02 DIAGNOSIS — R109 Unspecified abdominal pain: Secondary | ICD-10-CM | POA: Diagnosis not present

## 2014-03-02 DIAGNOSIS — G8929 Other chronic pain: Secondary | ICD-10-CM | POA: Insufficient documentation

## 2014-03-02 DIAGNOSIS — M542 Cervicalgia: Secondary | ICD-10-CM | POA: Diagnosis not present

## 2014-03-02 DIAGNOSIS — Z79899 Other long term (current) drug therapy: Secondary | ICD-10-CM | POA: Diagnosis not present

## 2014-03-02 DIAGNOSIS — Z8659 Personal history of other mental and behavioral disorders: Secondary | ICD-10-CM | POA: Insufficient documentation

## 2014-03-02 DIAGNOSIS — N390 Urinary tract infection, site not specified: Secondary | ICD-10-CM | POA: Insufficient documentation

## 2014-03-02 DIAGNOSIS — M545 Low back pain, unspecified: Secondary | ICD-10-CM | POA: Insufficient documentation

## 2014-03-02 DIAGNOSIS — R1013 Epigastric pain: Secondary | ICD-10-CM | POA: Diagnosis not present

## 2014-03-02 DIAGNOSIS — R197 Diarrhea, unspecified: Secondary | ICD-10-CM | POA: Insufficient documentation

## 2014-03-02 DIAGNOSIS — K7689 Other specified diseases of liver: Secondary | ICD-10-CM | POA: Diagnosis not present

## 2014-03-02 DIAGNOSIS — M549 Dorsalgia, unspecified: Secondary | ICD-10-CM

## 2014-03-02 HISTORY — DX: Cervicalgia: M54.2

## 2014-03-02 HISTORY — DX: Dorsalgia, unspecified: M54.9

## 2014-03-02 HISTORY — DX: Other chronic pain: G89.29

## 2014-03-02 LAB — URINE MICROSCOPIC-ADD ON

## 2014-03-02 LAB — COMPREHENSIVE METABOLIC PANEL
ALK PHOS: 102 U/L (ref 39–117)
ALT: 18 U/L (ref 0–35)
AST: 21 U/L (ref 0–37)
Albumin: 3.4 g/dL — ABNORMAL LOW (ref 3.5–5.2)
BUN: 10 mg/dL (ref 6–23)
CO2: 27 mEq/L (ref 19–32)
Calcium: 9.3 mg/dL (ref 8.4–10.5)
Chloride: 107 mEq/L (ref 96–112)
Creatinine, Ser: 0.95 mg/dL (ref 0.50–1.10)
GFR calc non Af Amer: 66 mL/min — ABNORMAL LOW (ref >90)
GFR, EST AFRICAN AMERICAN: 76 mL/min — AB (ref >90)
GLUCOSE: 108 mg/dL — AB (ref 70–99)
Potassium: 3.6 mEq/L — ABNORMAL LOW (ref 3.7–5.3)
Sodium: 143 mEq/L (ref 137–147)
TOTAL PROTEIN: 7 g/dL (ref 6.0–8.3)
Total Bilirubin: 0.3 mg/dL (ref 0.3–1.2)

## 2014-03-02 LAB — CBC WITH DIFFERENTIAL/PLATELET
Basophils Absolute: 0 10*3/uL (ref 0.0–0.1)
Basophils Relative: 0 % (ref 0–1)
EOS ABS: 0 10*3/uL (ref 0.0–0.7)
EOS PCT: 1 % (ref 0–5)
HCT: 39.9 % (ref 36.0–46.0)
HEMOGLOBIN: 13.1 g/dL (ref 12.0–15.0)
LYMPHS ABS: 1.7 10*3/uL (ref 0.7–4.0)
Lymphocytes Relative: 34 % (ref 12–46)
MCH: 28.7 pg (ref 26.0–34.0)
MCHC: 32.8 g/dL (ref 30.0–36.0)
MCV: 87.3 fL (ref 78.0–100.0)
MONO ABS: 0.3 10*3/uL (ref 0.1–1.0)
MONOS PCT: 5 % (ref 3–12)
NEUTROS PCT: 60 % (ref 43–77)
Neutro Abs: 3.1 10*3/uL (ref 1.7–7.7)
Platelets: 246 10*3/uL (ref 150–400)
RBC: 4.57 MIL/uL (ref 3.87–5.11)
RDW: 13.2 % (ref 11.5–15.5)
WBC: 5.1 10*3/uL (ref 4.0–10.5)

## 2014-03-02 LAB — URINALYSIS, ROUTINE W REFLEX MICROSCOPIC
BILIRUBIN URINE: NEGATIVE
Glucose, UA: NEGATIVE mg/dL
Ketones, ur: NEGATIVE mg/dL
Leukocytes, UA: NEGATIVE
Nitrite: NEGATIVE
Protein, ur: NEGATIVE mg/dL
Specific Gravity, Urine: >1.03 — ABNORMAL HIGH (ref 1.005–1.030)
UROBILINOGEN UA: 0.2 mg/dL (ref 0.0–1.0)
pH: 6 (ref 5.0–8.0)

## 2014-03-02 LAB — LIPASE, BLOOD: LIPASE: 22 U/L (ref 11–59)

## 2014-03-02 MED ORDER — CYCLOBENZAPRINE HCL 10 MG PO TABS: 10.0000 mg | ORAL_TABLET | Freq: Two times a day (BID) | ORAL | Status: DC | PRN

## 2014-03-02 MED ORDER — KETOROLAC TROMETHAMINE 30 MG/ML IJ SOLN
30.0000 mg | Freq: Once | INTRAMUSCULAR | Status: AC
  Administered 2014-03-02: 30 mg via INTRAVENOUS
  Filled 2014-03-02: qty 1

## 2014-03-02 MED ORDER — SULFAMETHOXAZOLE-TRIMETHOPRIM 800-160 MG PO TABS: 1.0000 | ORAL_TABLET | Freq: Two times a day (BID) | ORAL | Status: DC

## 2014-03-02 MED ORDER — HYDROCODONE-ACETAMINOPHEN 5-325 MG PO TABS
1.0000 | ORAL_TABLET | ORAL | Status: DC | PRN
Start: 2014-03-02 — End: 2014-08-03

## 2014-03-02 NOTE — ED Notes (Signed)
Multiple complaints. States she has been having difficulty urinating x 1-2 weeks. States abdominal pain, tingling and pain to legs, and states bowel movements aren't normal. States BMs have changed since having colonoscopy 2 years ago.

## 2014-03-02 NOTE — ED Provider Notes (Signed)
CSN: [id number]     Arrival date & time 03/02/14  1914 History   First MD Initiated Contact with Patient 03/02/14 0901     Chief Complaint  Patient presents with  . Abdominal Pain     (Consider location/radiation/quality/duration/timing/severity/associated sxs/prior Treatment) HPI Comments: Patient is a 17 morbidly obese female with PMHx significant for chronic neck and back pain who presents to the ED with complains of lower back pain with radiation around to her epigastric area and difficulty with urinating for the past several weeks.  She states the pain is worse with movement, is able to ambulate but reports tingling to bilateral thighs without numbness.  She denies loss of control of bowels or bladder but states she has some dribbling of urine.  She denies hematuria.  She has not seen her PCP for this.    Patient is a 58 y.o. female presenting with abdominal pain. The history is provided by the patient. No language interpreter was used.  Abdominal Pain Pain location:  L flank and R flank Pain quality: aching   Pain radiates to:  Epigastric region Pain severity:  Mild Onset quality:  Gradual Duration:  2 weeks Timing:  Constant Progression:  Worsening Chronicity:  New Context: not alcohol use, not diet changes, not eating, not recent illness, not sick contacts and not suspicious food intake   Relieved by:  Nothing Worsened by:  Movement Ineffective treatments:  None tried Associated symptoms: diarrhea and dysuria   Associated symptoms: no anorexia, no chest pain, no constipation, no fever, no hematuria, no nausea and no vomiting     Past Medical History  Diagnosis Date  . Depression   . Chronic neck pain 2013  . Chronic back pain 2013   Past Surgical History  Procedure Laterality Date  . Breast cyst excision      right  . Tubal ligation     Family History  Problem Relation Age of Onset  . Hypertension Mother   . Depression Mother   . Hypertension Sister   .  Hypertension Sister    History  Substance Use Topics  . Smoking status: Never Smoker   . Smokeless tobacco: Not on file  . Alcohol Use: No   OB History   Grav Para Term Preterm Abortions TAB SAB Ect Mult Living                 Review of Systems  Constitutional: Negative for fever.  Cardiovascular: Negative for chest pain.  Gastrointestinal: Positive for abdominal pain and diarrhea. Negative for nausea, vomiting, constipation and anorexia.  Genitourinary: Positive for dysuria. Negative for hematuria.  All other systems reviewed and are negative.     Allergies  Review of patient's allergies indicates no known allergies.  Home Medications   Prior to Admission medications   Medication Sig Start Date End Date Taking? Authorizing Provider  azithromycin (ZITHROMAX Z-PAK) 250 MG tablet Take two tablets on day one, then one tab qd days 2-5 07/05/13   Tammy L. Triplett, PA-C  chlorpheniramine-HYDROcodone (TUSSIONEX PENNKINETIC ER) 10-8 MG/5ML LQCR Take 5 mLs by mouth every 12 (twelve) hours as needed. 07/05/13   Tammy L. Triplett, PA-C  ibuprofen (ADVIL,MOTRIN) 800 MG tablet Take 1 tablet (800 mg total) by mouth 3 (three) times daily. 07/21/13   Maudry Diego, MD  traMADol (ULTRAM) 50 MG tablet Take 1 tablet (50 mg total) by mouth every 6 (six) hours as needed for pain. 07/21/13   Maudry Diego, MD   BP  151/81  Pulse 72  Temp(Src) 97.9 F (36.6 C) (Oral)  Resp 16  Ht 5\' 2"  (1.575 m)  Wt 270 lb (122.471 kg)  BMI 49.37 kg/m2  SpO2 100%  LMP 12/20/2011 Physical Exam  Nursing note and vitals reviewed. Constitutional: She is oriented to person, place, and time. She appears well-developed and well-nourished. No distress.  HENT:  Head: Normocephalic and atraumatic.  Right Ear: External ear normal.  Left Ear: External ear normal.  Nose: Nose normal.  Mouth/Throat: Oropharynx is clear and moist. No oropharyngeal exudate.  Eyes: Conjunctivae are normal. Pupils are equal, round, and  reactive to light. No scleral icterus.  Neck: Normal range of motion. Neck supple. No spinous process tenderness and no muscular tenderness present.  Cardiovascular: Normal rate, regular rhythm and normal heart sounds.  Exam reveals no gallop and no friction rub.   No murmur heard. Pulmonary/Chest: Effort normal and breath sounds normal. No respiratory distress. She has no wheezes. She has no rales. She exhibits no tenderness.  Abdominal: Soft. Bowel sounds are normal. She exhibits no distension. There is no tenderness. There is no rebound and no guarding.  Morbidly obese - no CVA tenderness  Musculoskeletal:       Lumbar back: She exhibits tenderness and bony tenderness. She exhibits normal range of motion.       Back:  Lymphadenopathy:    She has no cervical adenopathy.  Neurological: She is alert and oriented to person, place, and time. No sensory deficit. She exhibits normal muscle tone. Coordination normal.  Reflex Scores:      Patellar reflexes are 2+ on the right side and 2+ on the left side.      Achilles reflexes are 2+ on the right side and 2+ on the left side. 5/5 strength in bilateral hip flexors, quads, hamstrings, dorsiflexion and plantar flexion of foot.  Skin: Skin is warm and dry. No rash noted. No erythema. No pallor.  Psychiatric: She has a normal mood and affect. Her behavior is normal. Judgment and thought content normal.    ED Course  Procedures (including critical care time) Labs Review Labs Reviewed  CBC WITH DIFFERENTIAL  COMPREHENSIVE METABOLIC PANEL  LIPASE, BLOOD  URINALYSIS, ROUTINE W REFLEX MICROSCOPIC    Imaging Review No results found.   EKG Interpretation None      Results for orders placed during the hospital encounter of 03/02/14  CBC WITH DIFFERENTIAL      Result Value Ref Range   WBC 5.1  4.0 - 10.5 K/uL   RBC 4.57  3.87 - 5.11 MIL/uL   Hemoglobin 13.1  12.0 - 15.0 g/dL   HCT 39.9  36.0 - 46.0 %   MCV 87.3  78.0 - 100.0 fL   MCH 28.7   26.0 - 34.0 pg   MCHC 32.8  30.0 - 36.0 g/dL   RDW 13.2  11.5 - 15.5 %   Platelets 246  150 - 400 K/uL   Neutrophils Relative % 60  43 - 77 %   Neutro Abs 3.1  1.7 - 7.7 K/uL   Lymphocytes Relative 34  12 - 46 %   Lymphs Abs 1.7  0.7 - 4.0 K/uL   Monocytes Relative 5  3 - 12 %   Monocytes Absolute 0.3  0.1 - 1.0 K/uL   Eosinophils Relative 1  0 - 5 %   Eosinophils Absolute 0.0  0.0 - 0.7 K/uL   Basophils Relative 0  0 - 1 %   Basophils Absolute  0.0  0.0 - 0.1 K/uL  COMPREHENSIVE METABOLIC PANEL      Result Value Ref Range   Sodium 143  137 - 147 mEq/L   Potassium 3.6 (*) 3.7 - 5.3 mEq/L   Chloride 107  96 - 112 mEq/L   CO2 27  19 - 32 mEq/L   Glucose, Bld 108 (*) 70 - 99 mg/dL   BUN 10  6 - 23 mg/dL   Creatinine, Ser 1.61  0.50 - 1.10 mg/dL   Calcium 9.3  8.4 - 09.6 mg/dL   Total Protein 7.0  6.0 - 8.3 g/dL   Albumin 3.4 (*) 3.5 - 5.2 g/dL   AST 21  0 - 37 U/L   ALT 18  0 - 35 U/L   Alkaline Phosphatase 102  39 - 117 U/L   Total Bilirubin 0.3  0.3 - 1.2 mg/dL   GFR calc non Af Amer 66 (*) >90 mL/min   GFR calc Af Amer 76 (*) >90 mL/min  LIPASE, BLOOD      Result Value Ref Range   Lipase 22  11 - 59 U/L  URINALYSIS, ROUTINE W REFLEX MICROSCOPIC      Result Value Ref Range   Color, Urine YELLOW  YELLOW   APPearance CLEAR  CLEAR   Specific Gravity, Urine >1.030 (*) 1.005 - 1.030   pH 6.0  5.0 - 8.0   Glucose, UA NEGATIVE  NEGATIVE mg/dL   Hgb urine dipstick SMALL (*) NEGATIVE   Bilirubin Urine NEGATIVE  NEGATIVE   Ketones, ur NEGATIVE  NEGATIVE mg/dL   Protein, ur NEGATIVE  NEGATIVE mg/dL   Urobilinogen, UA 0.2  0.0 - 1.0 mg/dL   Nitrite NEGATIVE  NEGATIVE   Leukocytes, UA NEGATIVE  NEGATIVE  URINE MICROSCOPIC-ADD ON      Result Value Ref Range   Squamous Epithelial / LPF MANY (*) RARE   RBC / HPF 7-10  <3 RBC/hpf   Bacteria, UA MANY (*) RARE   Dg Abd 1 View  03/02/2014   CLINICAL DATA:  ABDOMINAL PAIN  EXAM: ABDOMEN - 1 VIEW  COMPARISON:  None.  FINDINGS: The  bowel gas pattern is normal. A small 4 mm calcific density just lateral to the transverse process of L3 on the left in the expected course of left ureter. Osseous structures unremarkable.  IMPRESSION: Nonobstructive bowel gas pattern.  Findings which may reflect a mid to distal left ureteral calculus versus density within stool or nonureteral structure.   Electronically Signed   By: Salome Holmes M.D.   On: 03/02/2014 10:11    Results for orders placed during the hospital encounter of 03/02/14  CBC WITH DIFFERENTIAL      Result Value Ref Range   WBC 5.1  4.0 - 10.5 K/uL   RBC 4.57  3.87 - 5.11 MIL/uL   Hemoglobin 13.1  12.0 - 15.0 g/dL   HCT 04.5  40.9 - 81.1 %   MCV 87.3  78.0 - 100.0 fL   MCH 28.7  26.0 - 34.0 pg   MCHC 32.8  30.0 - 36.0 g/dL   RDW 91.4  78.2 - 95.6 %   Platelets 246  150 - 400 K/uL   Neutrophils Relative % 60  43 - 77 %   Neutro Abs 3.1  1.7 - 7.7 K/uL   Lymphocytes Relative 34  12 - 46 %   Lymphs Abs 1.7  0.7 - 4.0 K/uL   Monocytes Relative 5  3 - 12 %  Monocytes Absolute 0.3  0.1 - 1.0 K/uL   Eosinophils Relative 1  0 - 5 %   Eosinophils Absolute 0.0  0.0 - 0.7 K/uL   Basophils Relative 0  0 - 1 %   Basophils Absolute 0.0  0.0 - 0.1 K/uL  COMPREHENSIVE METABOLIC PANEL      Result Value Ref Range   Sodium 143  137 - 147 mEq/L   Potassium 3.6 (*) 3.7 - 5.3 mEq/L   Chloride 107  96 - 112 mEq/L   CO2 27  19 - 32 mEq/L   Glucose, Bld 108 (*) 70 - 99 mg/dL   BUN 10  6 - 23 mg/dL   Creatinine, Ser 0.95  0.50 - 1.10 mg/dL   Calcium 9.3  8.4 - 10.5 mg/dL   Total Protein 7.0  6.0 - 8.3 g/dL   Albumin 3.4 (*) 3.5 - 5.2 g/dL   AST 21  0 - 37 U/L   ALT 18  0 - 35 U/L   Alkaline Phosphatase 102  39 - 117 U/L   Total Bilirubin 0.3  0.3 - 1.2 mg/dL   GFR calc non Af Amer 66 (*) >90 mL/min   GFR calc Af Amer 76 (*) >90 mL/min  LIPASE, BLOOD      Result Value Ref Range   Lipase 22  11 - 59 U/L  URINALYSIS, ROUTINE W REFLEX MICROSCOPIC      Result Value Ref Range     Color, Urine YELLOW  YELLOW   APPearance CLEAR  CLEAR   Specific Gravity, Urine >1.030 (*) 1.005 - 1.030   pH 6.0  5.0 - 8.0   Glucose, UA NEGATIVE  NEGATIVE mg/dL   Hgb urine dipstick SMALL (*) NEGATIVE   Bilirubin Urine NEGATIVE  NEGATIVE   Ketones, ur NEGATIVE  NEGATIVE mg/dL   Protein, ur NEGATIVE  NEGATIVE mg/dL   Urobilinogen, UA 0.2  0.0 - 1.0 mg/dL   Nitrite NEGATIVE  NEGATIVE   Leukocytes, UA NEGATIVE  NEGATIVE  URINE MICROSCOPIC-ADD ON      Result Value Ref Range   Squamous Epithelial / LPF MANY (*) RARE   RBC / HPF 7-10  <3 RBC/hpf   Bacteria, UA MANY (*) RARE   Ct Abdomen Pelvis Wo Contrast  03/02/2014   CLINICAL DATA:  Abdominal pain, difficulty urinating  EXAM: CT ABDOMEN AND PELVIS WITHOUT CONTRAST  TECHNIQUE: Multidetector CT imaging of the abdomen and pelvis was performed following the standard protocol without IV contrast.  COMPARISON:  None.  FINDINGS: Lung bases are clear.  Mild hepatic steatosis.  Spleen, pancreas, and adrenal glands are within normal limits.  Gallbladder is unremarkable. No intrahepatic or extrahepatic ductal dilatation.  Kidneys are within normal limits. No renal calculi or hydronephrosis.  No evidence of bowel obstruction.  Normal appendix.  No evidence of abdominal aortic aneurysm.  No abdominopelvic ascites.  No suspicious abdominopelvic lymphadenopathy.  Suspected degenerating/necrotic 5.3 x 5.2 x 6.3 cm intramural fundal fibroid. Bilateral ovaries are unremarkable.  No ureteral or bladder calculi.  Bladder is within normal limits.  Mild degenerative changes of the visualized thoracolumbar spine.  IMPRESSION: No renal, ureteral, or bladder calculi.  No hydronephrosis.  Mild hepatic steatosis.  Suspected 6.3 cm degenerating intramural fundal fibroid.   Electronically Signed   By: Julian Hy M.D.   On: 03/02/2014 11:57   Dg Abd 1 View  03/02/2014   CLINICAL DATA:  ABDOMINAL PAIN  EXAM: ABDOMEN - 1 VIEW  COMPARISON:  None.  FINDINGS:  The bowel  gas pattern is normal. A small 4 mm calcific density just lateral to the transverse process of L3 on the left in the expected course of left ureter. Osseous structures unremarkable.  IMPRESSION: Nonobstructive bowel gas pattern.  Findings which may reflect a mid to distal left ureteral calculus versus density within stool or nonureteral structure.   Electronically Signed   By: Margaree Mackintosh M.D.   On: 03/02/2014 10:11       MDM   UTI Chronic back pain  Patient presents with worsening of chronic lower back pain and radiation to bilateral flank with dysuria - no clinical suspicion for pyelonephritis - no kidney stone noted on CT scan - patient to follow up with PCP.    Idalia Needle Joelyn Oms, PA-C 03/02/14 1309

## 2014-03-02 NOTE — Discharge Instructions (Signed)
Back Pain, Adult Low back pain is very common. About 1 in 5 people have back pain.The cause of low back pain is rarely dangerous. The pain often gets better over time.About half of people with a sudden onset of back pain feel better in just 2 weeks. About 8 in 10 people feel better by 6 weeks.  CAUSES Some common causes of back pain include:  Strain of the muscles or ligaments supporting the spine.  Wear and tear (degeneration) of the spinal discs.  Arthritis.  Direct injury to the back. DIAGNOSIS Most of the time, the direct cause of low back pain is not known.However, back pain can be treated effectively even when the exact cause of the pain is unknown.Answering your caregiver's questions about your overall health and symptoms is one of the most accurate ways to make sure the cause of your pain is not dangerous. If your caregiver needs more information, he or she may order lab work or imaging tests (X-rays or MRIs).However, even if imaging tests show changes in your back, this usually does not require surgery. HOME CARE INSTRUCTIONS For many people, back pain returns.Since low back pain is rarely dangerous, it is often a condition that people can learn to [facility] their own.   Remain active. It is stressful on the back to sit or stand in one place. Do not sit, drive, or stand in one place for more than 30 minutes at a time. Take short walks on level surfaces as soon as pain allows.Try to increase the length of time you walk each day.  Do not stay in bed.Resting more than 1 or 2 days can delay your recovery.  Do not avoid exercise or work.Your body is made to move.It is not dangerous to be active, even though your back may hurt.Your back will likely heal faster if you return to being active before your pain is gone.  Pay attention to your body when you bend and lift. Many people have less discomfortwhen lifting if they bend their knees, keep the load close to their bodies,and  avoid twisting. Often, the most comfortable positions are those that put less stress on your recovering back.  Find a comfortable position to sleep. Use a firm mattress and lie on your side with your knees slightly bent. If you lie on your back, put a pillow under your knees.  Only take over-the-counter or prescription medicines as directed by your caregiver. Over-the-counter medicines to reduce pain and inflammation are often the most helpful.Your caregiver may prescribe muscle relaxant drugs.These medicines help dull your pain so you can more quickly return to your normal activities and healthy exercise.  Put ice on the injured area.  Put ice in a plastic bag.  Place a towel between your skin and the bag.  Leave the ice on for 15-20 minutes, 03-04 times a day for the first 2 to 3 days. After that, ice and heat may be alternated to reduce pain and spasms.  Ask your caregiver about trying back exercises and gentle massage. This may be of some benefit.  Avoid feeling anxious or stressed.Stress increases muscle tension and can worsen back pain.It is important to recognize when you are anxious or stressed and learn ways to manage it.Exercise is a great option. SEEK MEDICAL CARE IF:  You have pain that is not relieved with rest or medicine.  You have pain that does not improve in 1 week.  You have new symptoms.  You are generally not feeling well. SEEK  medicine.   You have pain that does not improve in 1 week.   You have new symptoms.   You are generally not feeling well.  SEEK IMMEDIATE MEDICAL CARE IF:    You have pain that radiates from your back into your legs.   You develop new bowel or bladder control problems.   You have unusual weakness or numbness in your arms or legs.   You develop nausea or vomiting.   You develop abdominal pain.   You feel faint.  Document Released: 10/07/2005 Document Revised: 04/07/2012 Document Reviewed: 02/25/2011  [employer] Patient Information 2014 [employer]  Urinary Tract Infection  Urinary tract infections (UTIs) can develop anywhere along your urinary tract. Your  urinary tract is your body's drainage system for removing wastes and extra water. Your urinary tract includes two kidneys, two ureters, a bladder, and a urethra. Your kidneys are a pair of bean-shaped organs. Each kidney is about the size of your fist. They are located below your ribs, one on each side of your spine.  CAUSES  Infections are caused by microbes, which are microscopic organisms, including fungi, viruses, and bacteria. These organisms are so small that they can only be seen through a microscope. Bacteria are the microbes that most commonly cause UTIs.  SYMPTOMS   Symptoms of UTIs may vary by age and gender of the patient and by the location of the infection. Symptoms in young women typically include a frequent and intense urge to urinate and a painful, burning feeling in the bladder or urethra during urination. Older women and men are more likely to be tired, shaky, and weak and have muscle aches and abdominal pain. A fever may mean the infection is in your kidneys. Other symptoms of a kidney infection include pain in your back or sides below the ribs, nausea, and vomiting.  DIAGNOSIS  To diagnose a UTI, your caregiver will ask you about your symptoms. Your caregiver also will ask to provide a urine sample. The urine sample will be tested for bacteria and white blood cells. White blood cells are made by your body to help fight infection.  TREATMENT   Typically, UTIs can be treated with medication. Because most UTIs are caused by a bacterial infection, they usually can be treated with the use of antibiotics. The choice of antibiotic and length of treatment depend on your symptoms and the type of bacteria causing your infection.  HOME CARE INSTRUCTIONS   If you were prescribed antibiotics, take them exactly as your caregiver instructs you. Finish the medication even if you feel better after you have only taken some of the medication.   Drink enough water and fluids to keep your urine clear or pale  yellow.   Avoid caffeine, tea, and carbonated beverages. They tend to irritate your bladder.   Empty your bladder often. Avoid holding urine for long periods of time.   Empty your bladder before and after sexual intercourse.   After a bowel movement, women should cleanse from front to back. Use each tissue only once.  SEEK MEDICAL CARE IF:    You have back pain.   You develop a fever.   Your symptoms do not begin to resolve within 3 days.  SEEK IMMEDIATE MEDICAL CARE IF:    You have severe back pain or lower abdominal pain.   You develop chills.   You have nausea or vomiting.   You have continued burning or discomfort with urination.  MAKE SURE YOU:   

## 2014-03-03 NOTE — ED Provider Notes (Signed)
Medical screening examination/treatment/procedure(s) were performed by non-physician practitioner and as supervising physician I was immediately available for consultation/collaboration.   EKG Interpretation None        Alfonzo Feller, DO 03/03/14 1022

## 2014-05-10 ENCOUNTER — Ambulatory Visit: Payer: Self-pay | Admitting: Family Medicine

## 2014-08-03 ENCOUNTER — Encounter (HOSPITAL_COMMUNITY): Payer: Self-pay | Admitting: Emergency Medicine

## 2014-08-03 ENCOUNTER — Emergency Department (HOSPITAL_COMMUNITY)
Admission: EM | Admit: 2014-08-03 | Discharge: 2014-08-04 | Disposition: A | Discharge status: Home / Self Care | Payer: Medicare Other | Attending: Emergency Medicine | Admitting: Emergency Medicine

## 2014-08-03 DIAGNOSIS — G8929 Other chronic pain: Secondary | ICD-10-CM | POA: Diagnosis not present

## 2014-08-03 DIAGNOSIS — R110 Nausea: Secondary | ICD-10-CM | POA: Diagnosis not present

## 2014-08-03 DIAGNOSIS — Z8659 Personal history of other mental and behavioral disorders: Secondary | ICD-10-CM | POA: Diagnosis not present

## 2014-08-03 DIAGNOSIS — E669 Obesity, unspecified: Secondary | ICD-10-CM | POA: Diagnosis not present

## 2014-08-03 DIAGNOSIS — N390 Urinary tract infection, site not specified: Secondary | ICD-10-CM | POA: Insufficient documentation

## 2014-08-03 DIAGNOSIS — M549 Dorsalgia, unspecified: Secondary | ICD-10-CM | POA: Diagnosis present

## 2014-08-03 DIAGNOSIS — R319 Hematuria, unspecified: Secondary | ICD-10-CM | POA: Diagnosis not present

## 2014-08-03 LAB — URINE MICROSCOPIC-ADD ON

## 2014-08-03 LAB — URINALYSIS, ROUTINE W REFLEX MICROSCOPIC
Bilirubin Urine: NEGATIVE
GLUCOSE, UA: NEGATIVE mg/dL
Ketones, ur: NEGATIVE mg/dL
Nitrite: NEGATIVE
Protein, ur: NEGATIVE mg/dL
SPECIFIC GRAVITY, URINE: 1.02 (ref 1.005–1.030)
Urobilinogen, UA: 0.2 mg/dL (ref 0.0–1.0)
pH: 6 (ref 5.0–8.0)

## 2014-08-03 NOTE — ED Notes (Signed)
Pt c/o lower back pain with tingling all over per pt. Pt denies any injury.

## 2014-08-03 NOTE — ED Notes (Signed)
Pt c/o difficulty with urination and states that she "thinks there might have been some blood in her urine." Pt c/o intermittent back pain for the last two days.

## 2014-08-03 NOTE — ED Provider Notes (Signed)
CSN: [id number]     Arrival date & time 08/03/14  2107   This chart was scribed for Janice Norrie, MD by Hilda Lias, ED Scribe. This patient was seen in room APA15/APA15 and the patient's care was started at 11:45 PM.    Chief Complaint  Patient presents with  . Back Pain     (Consider location/radiation/quality/duration/timing/severity/associated sxs/prior Treatment) The history is provided by the patient. No language interpreter was used.   HPI Comments: Sheryl [name] is a 58 y.o. female who presents to the Emergency Department complaining of lower back pain with associated tingling in legs and arms that has been constant since yesterday. She describes painful area as "around her kidneys". Pt notes that she has experienced similar symptoms in the past, but it has not happened in a long time. She notes for the past week  she has had dysuria, frequency, urgency, and a burning sensation upon voiding, and today saw blood when she wipes. She denies fever, vomiting, light-headedness, and vaginal discharge. She does have nausea.  Pt does  have history of UTI, and does not smoke or drink. She denies neck pain and states the back pain is different from her chronic back pain.    PCP: Dr. Buelah Manis   Past Medical History  Diagnosis Date  . Depression   . Chronic neck pain 2013  . Chronic back pain 2013   Past Surgical History  Procedure Laterality Date  . Breast cyst excision      right  . Tubal ligation     Family History  Problem Relation Age of Onset  . Hypertension Mother   . Depression Mother   . Hypertension Sister   . Hypertension Sister    History  Substance Use Topics  . Smoking status: Never Smoker   . Smokeless tobacco: Not on file  . Alcohol Use: No   On disability for depression  OB History   Grav Para Term Preterm Abortions TAB SAB Ect Mult Living                 Review of Systems  Constitutional: Negative for fever.  Gastrointestinal: Positive for  nausea. Negative for vomiting.  Genitourinary: Positive for dysuria, urgency and frequency. Negative for vaginal discharge.  Musculoskeletal: Positive for back pain.  Neurological: Negative for dizziness and light-headedness.  All other systems reviewed and are negative.     Allergies  Review of patient's allergies indicates no known allergies.  Home Medications   Prior to Admission medications   Medication Sig Start Date End Date Taking? Authorizing Provider   none  BP 164/83  Pulse 85  Temp(Src) 98.8 F (37.1 C) (Oral)  Resp 20  Ht 5\' 2"  (1.575 m)  Wt 264 lb 8 oz (119.976 kg)  BMI 48.37 kg/m2  SpO2 100%  LMP 12/20/2011  Vital signs normal    Physical Exam  Nursing note and vitals reviewed. Constitutional: She is oriented to person, place, and time. She appears well-developed and well-nourished.  Non-toxic appearance. She does not appear ill. No distress.  Obese   HENT:  Head: Normocephalic and atraumatic.  Right Ear: External ear normal.  Left Ear: External ear normal.  Nose: Nose normal. No mucosal edema or rhinorrhea.  Mouth/Throat: Oropharynx is clear and moist and mucous membranes are normal. No dental abscesses or uvula swelling.  Eyes: Conjunctivae and EOM are normal. Pupils are equal, round, and reactive to light.  Neck: Normal range of motion and full passive range of  motion without pain. Neck supple.  Cardiovascular: Normal rate, regular rhythm and normal heart sounds.  Exam reveals no gallop and no friction rub.   No murmur heard. Pulmonary/Chest: Effort normal and breath sounds normal. No respiratory distress. She has no wheezes. She has no rhonchi. She has no rales. She exhibits no tenderness and no crepitus.  Abdominal: Soft. Normal appearance and bowel sounds are normal. She exhibits no distension. There is no tenderness. There is no rebound and no guarding.  Genitourinary:  Bilateral CVA tenderness  Musculoskeletal: Normal range of motion. She  exhibits no edema and no tenderness.  No pain on ROM at waist, no pain with straight-leg raise bilaterally, Patellar reflexes 2 + and equal bilaterally  Neurological: She is alert and oriented to person, place, and time. She has normal strength. No cranial nerve deficit.  Skin: Skin is warm, dry and intact. No rash noted. No erythema. No pallor.  Psychiatric: She has a normal mood and affect. Her speech is normal and behavior is normal. Her mood appears not anxious.    ED Course  Procedures (including critical care time) Medications  cephALEXin (KEFLEX) capsule 500 mg (500 mg Oral Given 08/04/14 0023)     DIAGNOSTIC STUDIES: Oxygen Saturation is 100% on room air, normal by my interpretation.    COORDINATION OF CARE: 11:51 PM Discussed treatment plan with pt at bedside and pt agreed to plan.   Labs Review Results for orders placed during the hospital encounter of 08/03/14  URINALYSIS, ROUTINE W REFLEX MICROSCOPIC      Result Value Ref Range   Color, Urine YELLOW  YELLOW   APPearance CLEAR  CLEAR   Specific Gravity, Urine 1.020  1.005 - 1.030   pH 6.0  5.0 - 8.0   Glucose, UA NEGATIVE  NEGATIVE mg/dL   Hgb urine dipstick TRACE (*) NEGATIVE   Bilirubin Urine NEGATIVE  NEGATIVE   Ketones, ur NEGATIVE  NEGATIVE mg/dL   Protein, ur NEGATIVE  NEGATIVE mg/dL   Urobilinogen, UA 0.2  0.0 - 1.0 mg/dL   Nitrite NEGATIVE  NEGATIVE   Leukocytes, UA SMALL (*) NEGATIVE  URINE MICROSCOPIC-ADD ON      Result Value Ref Range   Squamous Epithelial / LPF MANY (*) RARE   WBC, UA 11-20  <3 WBC/hpf   RBC / HPF 3-6  <3 RBC/hpf   Bacteria, UA MANY (*) RARE   Laboratory interpretation all normal except contaminated urine but possible UTI   Imaging Review No results found.   EKG Interpretation None      MDM   Final diagnoses:  Urinary tract infection with hematuria, site unspecified     Discharge Medication List as of 08/04/2014 12:14 AM    START taking these medications    Details  cephALEXin (KEFLEX) 500 MG capsule Take 1 capsule (500 mg total) by mouth 3 (three) times daily., Starting 08/04/2014, Until Discontinued, Print    naproxen (NAPROSYN) 500 MG tablet Take 1 po BID with food prn pain, Print    ondansetron (ZOFRAN) 4 MG tablet Take 1 tablet (4 mg total) by mouth every 8 (eight) hours as needed., Starting 08/04/2014, Until Discontinued, Print        Plan discharge  Rolland Porter, MD, FACEP   I personally performed the services described in this documentation, which was scribed in my presence. The recorded information has been reviewed and considered.  Rolland Porter, MD, Abram Sander    Janice Norrie, MD 08/04/14 [phone]

## 2014-08-04 MED ORDER — CEPHALEXIN 500 MG PO CAPS: 500.0000 mg | ORAL_CAPSULE | Freq: Three times a day (TID) | ORAL | Status: DC

## 2014-08-04 MED ORDER — NAPROXEN 500 MG PO TABS: ORAL_TABLET | ORAL | Status: DC

## 2014-08-04 MED ORDER — CEPHALEXIN 500 MG PO CAPS
500.0000 mg | ORAL_CAPSULE | Freq: Once | ORAL | Status: AC
  Administered 2014-08-04: 500 mg via ORAL
  Filled 2014-08-04: qty 1

## 2014-08-04 MED ORDER — ONDANSETRON HCL 4 MG PO TABS: 4.0000 mg | ORAL_TABLET | Freq: Three times a day (TID) | ORAL | Status: DC | PRN

## 2014-08-04 NOTE — Discharge Instructions (Signed)
Drink plenty of fluids. Take the antibiotics until gone. Recheck if you get a fever, vomiting or worsening pain. Use the zofran for nausea or vomiting.

## 2014-12-05 ENCOUNTER — Ambulatory Visit: Payer: Medicare Other | Admitting: Family Medicine

## 2014-12-19 ENCOUNTER — Ambulatory Visit (INDEPENDENT_AMBULATORY_CARE_PROVIDER_SITE_OTHER): Payer: Medicare Other | Admitting: Family Medicine

## 2014-12-19 ENCOUNTER — Encounter: Payer: Self-pay | Admitting: Family Medicine

## 2014-12-19 VITALS — BP 160/82 | HR 78 | Temp 98.5°F | Resp 16 | Ht 62.0 in | Wt 263.0 lb

## 2014-12-19 DIAGNOSIS — R300 Dysuria: Secondary | ICD-10-CM

## 2014-12-19 DIAGNOSIS — N898 Other specified noninflammatory disorders of vagina: Secondary | ICD-10-CM

## 2014-12-19 DIAGNOSIS — I10 Essential (primary) hypertension: Secondary | ICD-10-CM | POA: Diagnosis not present

## 2014-12-19 DIAGNOSIS — F331 Major depressive disorder, recurrent, moderate: Secondary | ICD-10-CM | POA: Diagnosis not present

## 2014-12-19 DIAGNOSIS — M75102 Unspecified rotator cuff tear or rupture of left shoulder, not specified as traumatic: Secondary | ICD-10-CM

## 2014-12-19 LAB — CBC WITH DIFFERENTIAL/PLATELET
Basophils Absolute: 0 10*3/uL (ref 0.0–0.1)
Basophils Relative: 0 % (ref 0–1)
EOS ABS: 0 10*3/uL (ref 0.0–0.7)
Eosinophils Relative: 0 % (ref 0–5)
HCT: 41.4 % (ref 36.0–46.0)
HEMOGLOBIN: 14 g/dL (ref 12.0–15.0)
Lymphocytes Relative: 31 % (ref 12–46)
Lymphs Abs: 2.1 10*3/uL (ref 0.7–4.0)
MCH: 29 pg (ref 26.0–34.0)
MCHC: 33.8 g/dL (ref 30.0–36.0)
MCV: 85.9 fL (ref 78.0–100.0)
MPV: 9.2 fL (ref 8.6–12.4)
Monocytes Absolute: 0.3 10*3/uL (ref 0.1–1.0)
Monocytes Relative: 5 % (ref 3–12)
NEUTROS ABS: 4.3 10*3/uL (ref 1.7–7.7)
NEUTROS PCT: 64 % (ref 43–77)
Platelets: 252 10*3/uL (ref 150–400)
RBC: 4.82 MIL/uL (ref 3.87–5.11)
RDW: 14.1 % (ref 11.5–15.5)
WBC: 6.7 10*3/uL (ref 4.0–10.5)

## 2014-12-19 LAB — URINALYSIS, ROUTINE W REFLEX MICROSCOPIC
Bilirubin Urine: NEGATIVE
Glucose, UA: NEGATIVE mg/dL
Ketones, ur: NEGATIVE mg/dL
LEUKOCYTES UA: NEGATIVE
NITRITE: NEGATIVE
Protein, ur: NEGATIVE mg/dL
SPECIFIC GRAVITY, URINE: 1.015 (ref 1.005–1.030)
Urobilinogen, UA: 1 mg/dL (ref 0.0–1.0)
pH: 8.5 — ABNORMAL HIGH (ref 5.0–8.0)

## 2014-12-19 LAB — URINALYSIS, MICROSCOPIC ONLY: CRYSTALS: NONE SEEN

## 2014-12-19 LAB — COMPREHENSIVE METABOLIC PANEL
ALT: 20 U/L (ref 0–35)
AST: 24 U/L (ref 0–37)
Albumin: 4 g/dL (ref 3.5–5.2)
Alkaline Phosphatase: 109 U/L (ref 39–117)
BILIRUBIN TOTAL: 0.4 mg/dL (ref 0.2–1.2)
BUN: 11 mg/dL (ref 6–23)
CO2: 20 mEq/L (ref 19–32)
CREATININE: 1.1 mg/dL (ref 0.50–1.10)
Calcium: 9.7 mg/dL (ref 8.4–10.5)
Chloride: 104 mEq/L (ref 96–112)
Glucose, Bld: 79 mg/dL (ref 70–99)
Potassium: 4.1 mEq/L (ref 3.5–5.3)
Sodium: 139 mEq/L (ref 135–145)
Total Protein: 7.4 g/dL (ref 6.0–8.3)

## 2014-12-19 LAB — WET PREP FOR TRICH, YEAST, CLUE
CLUE CELLS WET PREP: NONE SEEN
Trich, Wet Prep: NONE SEEN
Yeast Wet Prep HPF POC: NONE SEEN

## 2014-12-19 MED ORDER — LISINOPRIL-HYDROCHLOROTHIAZIDE 10-12.5 MG PO TABS: 1.0000 | ORAL_TABLET | Freq: Every day | ORAL | Status: DC

## 2014-12-19 MED ORDER — ESCITALOPRAM OXALATE 10 MG PO TABS: 10.0000 mg | ORAL_TABLET | Freq: Every day | ORAL | Status: DC

## 2014-12-19 MED ORDER — MELOXICAM 7.5 MG PO TABS: 7.5000 mg | ORAL_TABLET | Freq: Every day | ORAL | Status: DC

## 2014-12-19 NOTE — Patient Instructions (Addendum)
Take the lexapro once a day for your mood Take the blood pressure pill once a day  Anti-inflammatory Mobic with food for your shoulder Take the antibiotics as prescribed for your bladder Referral to shoulder specialist- Dr. Aline Brochure F/U 4 weeks

## 2014-12-19 NOTE — Assessment & Plan Note (Signed)
Long standing problem for pt, difficult for her to give specifics about her stressors or examples Start lexapro 10mg  once a day

## 2014-12-19 NOTE — Assessment & Plan Note (Signed)
D/c BC powders, start Meloxicam once a day  Refer to orthopedics, some loss of ROM

## 2014-12-19 NOTE — Assessment & Plan Note (Signed)
Morbid obesity, no change in weight over past 2 years Will continue to work to provide info and resources for weight loss

## 2014-12-19 NOTE — Progress Notes (Signed)
Patient ID: Sheryl [name], female   DOB: [birth year], 59 y.o.   MRN: [id number]   Subjective:    Patient ID: Sheryl [name], female    DOB: 06-25-1956, 59 y.o.   MRN: [id number]  Patient presents for Dysuria; L Leg Pain; and L Arm Pain  patient here to reestablish care. She has multiple concerns. She has been seen in the ER twice over the past few months with urinary tract infection. She states that she has a bad odor to her urine and that she has burning sensation. She's also had some vaginal discharge. She denies any abdominal pain no fever or nausea or vomiting associated.  His history of hypertension she was on HCTZ a couple years ago she has not been on any medications for the past few years.  She also has history of major depression she states that things have been very stressful over the past 3 years she has to want disability greater than 10 years ago because of severe depression where she was almost in a catatonic state as she describes it. She finds herself crying randomly she also has little interest in doing anything and she was requesting something to help with her energy. Her son lives with her and she also has 2 daughters. She has a lot of joy out of taking care of her grandchild.  She complains of left arm pain which is been present for the past few months. She actually injured her arm last year. She feels like when she moves it is certain directions that he gets hung and gets stuck. Her pain is mostly in the upper arm on the left side she has pain when she tries to reach overhead at times. She is taking BC powders over-the-counter which have helped some. She also states that she had a knot on her left leg but that is now resolved.    Review Of Systems:  GEN- denies fatigue, fever, weight loss,weakness, recent illness HEENT- denies eye drainage, change in vision, nasal discharge, CVS- denies chest pain, palpitations RESP- denies SOB, cough, wheeze ABD- denies N/V, change in  stools, abd pain GU- denies dysuria, hematuria, dribbling, incontinence MSK- denies joint pain, muscle aches, injury Neuro- denies headache, dizziness, syncope, seizure activity       Objective:    BP 160/82 mmHg  Pulse 78  Temp(Src) 98.5 F (36.9 C) (Oral)  Resp 16  Ht 5\' 2"  (1.575 m)  Wt 263 lb (119.296 kg)  BMI 48.09 kg/m2  LMP 12/20/2011 GEN- NAD, alert and oriented x3 HEENT- PERRL, EOMI, non injected sclera, pink conjunctiva, MMM, oropharynx clear Neck- Supple, faiR ROM CVS- RRR, no murmur RESP-CTAB ABD-NABS,soft,NT,ND, no CVA tenderness MSK- Decreased ROM Left UE compared to right, + empty can on left side, biceps in tact,  GU- external, vaginal mucosa pink and moist, cervix visualized no growth, no blood form os, minimal thin clear discharge, no CMT, no ovarian masses, uterus normal size EXT- trace edema Pulses- Radial, DP- 2+        Assessment & Plan:      Problem List Items Addressed This Visit      Unprioritized   Rotator cuff syndrome of left shoulder    D/c BC powders, start Meloxicam once a day  Refer to orthopedics, some loss of ROM      Morbid obesity    Morbid obesity, no change in weight over past 2 years Will continue to work to provide info and resources for weight loss  MDD (major depressive disorder)    Long standing problem for pt, difficult for her to give specifics about her stressors or examples Start lexapro 10mg  once a day       Relevant Medications   escitalopram (LEXAPRO) tablet   Essential hypertension, benign    Uncontrolled BP, with obesity and family history will treat with lisinopril HCTZ Renal function to be checked today      Relevant Medications   LISINOPRIL-HCTZ 10-12.5 MG PO TABS   Other Relevant Orders   CBC with Differential/Platelet (Completed)   Comprehensive metabolic panel (Completed)    Other Visit Diagnoses    Dysuria        check urine culture and vaginal cultures before treatment    Relevant  Orders    Urinalysis, Routine w reflex microscopic (Completed)    Urine culture    Vaginal discharge        Relevant Orders    WET PREP FOR [place], YEAST, CLUE (Completed)       Note: This dictation was prepared with Dragon dictation along with smaller phrase technology. Any transcriptional errors that result from this process are unintentional.

## 2014-12-19 NOTE — Assessment & Plan Note (Signed)
Uncontrolled BP, with obesity and family history will treat with lisinopril HCTZ Renal function to be checked today

## 2014-12-21 LAB — URINE CULTURE

## 2014-12-26 ENCOUNTER — Other Ambulatory Visit: Payer: Self-pay | Admitting: *Deleted

## 2014-12-26 MED ORDER — CEPHALEXIN 500 MG PO CAPS: 500.0000 mg | ORAL_CAPSULE | Freq: Four times a day (QID) | ORAL | Status: DC

## 2014-12-27 ENCOUNTER — Ambulatory Visit (INDEPENDENT_AMBULATORY_CARE_PROVIDER_SITE_OTHER): Payer: Medicare Other | Admitting: Orthopedic Surgery

## 2014-12-27 ENCOUNTER — Ambulatory Visit (INDEPENDENT_AMBULATORY_CARE_PROVIDER_SITE_OTHER): Payer: Medicare Other

## 2014-12-27 ENCOUNTER — Encounter: Payer: Self-pay | Admitting: Orthopedic Surgery

## 2014-12-27 VITALS — Resp 18 | Ht 62.0 in | Wt 263.0 lb

## 2014-12-27 DIAGNOSIS — M25512 Pain in left shoulder: Secondary | ICD-10-CM

## 2014-12-27 DIAGNOSIS — M75102 Unspecified rotator cuff tear or rupture of left shoulder, not specified as traumatic: Secondary | ICD-10-CM

## 2014-12-27 NOTE — Progress Notes (Signed)
New patient new problem  Patient ID: Sheryl [name], female   DOB: 24-Dec-1955, 59 y.o.   MRN: [id number]  Chief Complaint  Patient presents with  . Shoulder Pain    left shoulder pain, no known injury, ref. [place]     Sheryl [name] is a 59 y.o. female.   HPI 59 year old female fell 3 times in the last few months complains of left shoulder pain pain with overhead activity locking and catching of the shoulder with 9 out of 10 pain. Previous treatment meloxicam 1 week can't tell if it's helped at this point. Review of Systems Her self-reported review of systems only complains of depression the remaining systems review which was complete was negative  Past Medical History  Diagnosis Date  . Depression   . Chronic neck pain 2013  . Chronic back pain 2013    Past Surgical History  Procedure Laterality Date  . Breast cyst excision      right  . Tubal ligation      Family History  Problem Relation Age of Onset  . Hypertension Mother   . Depression Mother   . Hypertension Sister   . Hypertension Sister     Social History History  Substance Use Topics  . Smoking status: Never Smoker   . Smokeless tobacco: Not on file  . Alcohol Use: No    No Known Allergies  Current Outpatient Prescriptions  Medication Sig Dispense Refill  . cephALEXin (KEFLEX) 500 MG capsule Take 1 capsule (500 mg total) by mouth 4 (four) times daily. 20 capsule 0  . escitalopram (LEXAPRO) 10 MG tablet Take 1 tablet (10 mg total) by mouth daily. 30 tablet 3  . lisinopril-hydrochlorothiazide (PRINZIDE,ZESTORETIC) 10-12.5 MG per tablet Take 1 tablet by mouth daily. 90 tablet 3  . meloxicam (MOBIC) 7.5 MG tablet Take 1 tablet (7.5 mg total) by mouth daily. 30 tablet 0   No current facility-administered medications for this visit.       Physical Exam Resp. rate 18, height 5\' 2"  (1.575 m), weight 263 lb (119.296 kg), last menstrual period 12/20/2011. Physical Exam The patient is well developed  well nourished and well groomed. Orientation to person place and time is normal  Mood is pleasant. Ambulatory status no assistive devices  The left shoulder has painful Forward elevation restricted range of motion. She is stable in abduction external rotation she has mild weakness with the empty kind resistance test for the supraspinatus the scans intact pulses are normal the sensations intact axillary lymph nodes are negative her impingement sign is positive drop test negative for cuff tear Right shoulder normal  Data Reviewed Independent interpretation of her plain films show no glenohumeral arthritis or fracture  Assessment Rotator cuff syndrome Plan Subacromial injection left shoulder. Physical therapy. Continue meloxicam. Follow-up 8 weeks.  Procedure note the subacromial injection shoulder left   Verbal consent was obtained to inject the  Left   Shoulder  Timeout was completed to confirm the injection site is a subacromial space of the  left  shoulder  Medication used Depo-Medrol 40 mg and lidocaine 1% 3 cc  Anesthesia was provided by ethyl chloride  The injection was performed in the left  posterior subacromial space. After pinning the skin with alcohol and anesthetized the skin with ethyl chloride the subacromial space was injected using a 20-gauge needle. There were no complications  Sterile dressing was applied.

## 2014-12-27 NOTE — Patient Instructions (Addendum)
Call to arrange therapy at [facility] THERAPY DEPT  Continue Meloxicam   Joint Injection Care After Refer to this sheet in the next few days. These instructions provide you with information on caring for yourself after you have had a joint injection. Your caregiver also may give you more specific instructions. Your treatment has been planned according to current medical practices, but problems sometimes occur. Call your caregiver if you have any problems or questions after your procedure. After any type of joint injection, it is not uncommon to experience:  Soreness, swelling, or bruising around the injection site.  Mild numbness, tingling, or weakness around the injection site caused by the numbing medicine used before or with the injection. It also is possible to experience the following effects associated with the specific agent after injection:  Iodine-based contrast agents:  Allergic reaction (itching, hives, widespread redness, and swelling beyond the injection site).  Corticosteroids (These effects are rare.):  Allergic reaction.  Increased blood sugar levels (If you have diabetes and you notice that your blood sugar levels have increased, notify your caregiver).  Increased blood pressure levels.  Mood swings.  Hyaluronic acid in the use of viscosupplementation.  Temporary heat or redness.  Temporary rash and itching.  Increased fluid accumulation in the injected joint. These effects all should resolve within a day after your procedure.  HOME CARE INSTRUCTIONS  Limit yourself to light activity the day of your procedure. Avoid lifting heavy objects, bending, stooping, or twisting.  Take prescription or over-the-counter pain medication as directed by your caregiver.  You may apply ice to your injection site to reduce pain and swelling the day of your procedure. Ice may be applied 03-04 times:  Put ice in a plastic bag.  Place a towel between your skin and the  bag.  Leave the ice on for no longer than 15-20 minutes each time. SEEK IMMEDIATE MEDICAL CARE IF:   Pain and swelling get worse rather than better or extend beyond the injection site.  Numbness does not go away.  Blood or fluid continues to leak from the injection site.  You have chest pain.  You have swelling of your face or tongue.  You have trouble breathing or you become dizzy.  You develop a fever, chills, or severe tenderness at the injection site that last longer than 1 day. MAKE SURE YOU:  Understand these instructions.  Watch your condition.  Get help right away if you are not doing well or if you get worse. Document Released: 06/20/2011 Document Revised: 12/30/2011 Document Reviewed: 06/20/2011 [facility] Patient Information 2015 [place], [place]. This information is not intended to replace advice given to you by your health care provider. Make sure you discuss any questions you have with your health care provider.

## 2015-01-03 ENCOUNTER — Ambulatory Visit (HOSPITAL_COMMUNITY): Payer: Medicare Other | Attending: Orthopedic Surgery

## 2015-01-03 ENCOUNTER — Encounter (HOSPITAL_COMMUNITY): Payer: Self-pay

## 2015-01-03 DIAGNOSIS — M629 Disorder of muscle, unspecified: Secondary | ICD-10-CM

## 2015-01-03 DIAGNOSIS — M25512 Pain in left shoulder: Secondary | ICD-10-CM | POA: Diagnosis not present

## 2015-01-03 DIAGNOSIS — M75102 Unspecified rotator cuff tear or rupture of left shoulder, not specified as traumatic: Secondary | ICD-10-CM

## 2015-01-03 DIAGNOSIS — M7582 Other shoulder lesions, left shoulder: Secondary | ICD-10-CM | POA: Diagnosis not present

## 2015-01-03 DIAGNOSIS — M25612 Stiffness of left shoulder, not elsewhere classified: Secondary | ICD-10-CM | POA: Diagnosis not present

## 2015-01-03 DIAGNOSIS — M6289 Other specified disorders of muscle: Secondary | ICD-10-CM

## 2015-01-03 NOTE — Therapy (Signed)
[place], [place], [address] Phone: [phone]   Fax:  [phone]  Occupational Therapy Evaluation  Patient Details  Name: Sheryl [name] MRN: [id number] Date of Birth: 1956-08-27 Referring Provider:  Carole Civil, MD  Encounter Date: 01/03/2015      OT End of Session - 01/03/15 1319    Visit Number 1   Number of Visits 12   Date for OT Re-Evaluation 02/28/15  mini reasses: 01/31/15   Authorization Type Medicare primary Medicaid secondary   Authorization Time Period before 10th visit   Authorization - Visit Number 1   Authorization - Number of Visits 10   OT Start Time 0930   OT Stop Time 1015   OT Time Calculation (min) 45 min   Activity Tolerance Patient tolerated treatment well   Behavior During Therapy [facility] for tasks assessed/performed      Past Medical History  Diagnosis Date  . Depression   . Chronic neck pain 2013  . Chronic back pain 2013    Past Surgical History  Procedure Laterality Date  . Breast cyst excision      right  . Tubal ligation      There were no vitals filed for this visit.  Visit Diagnosis:  Rotator cuff syndrome of left shoulder - Plan: Ot plan of care cert/re-cert  Pain in joint, shoulder region, left - Plan: Ot plan of care cert/re-cert  Decreased range of motion of left shoulder - Plan: Ot plan of care cert/re-cert  Tight fascia - Plan: Ot plan of care cert/re-cert      Subjective Assessment - 01/03/15 0935    Symptoms S: It catches a lot when I raise it up.   Pertinent History Patient is a 59 y/o female s/p left rotator cuff syndrome reporting onset more than 6 months ago. Pt reports that she has had several falls in the past landing on her face or her left shoulder. Pt received a cortizone shot on 12/27/14 from Dr. Aline Brochure bringing the pain level down from 9/10 to 5/10. Dr. Aline Brochure has referred patient to occupational therapy for evaluation and  treatment.    Special Tests FOTO score: 45/100   Patient Stated Goals To feel like she's 59 years old.   Currently in Pain? Yes   Pain Score 5    Pain Location Shoulder   Pain Orientation Left   Pain Descriptors / Indicators Burning;Constant   Pain Type Chronic pain           [facility] OT Assessment - 01/03/15 0937    Assessment   Diagnosis Left rotator cuff syndrome   Onset Date --  6 months   Assessment 02-21-15 Dr. Darden Palmer   Prior Therapy None    Precautions   Precautions None   Restrictions   Weight Bearing Restrictions No   Balance Screen   Has the patient fallen in the past 6 months No   Home  Environment   Family/patient expects to be discharged to: Private residence   Living Arrangements Children   Available Help at Discharge Available PRN/intermittently   Prior Function   Level of Independence Independent with basic ADLs;Independent with gait   ADL   ADL comments Difficulty racking leaves, housekeeping tasks, reaching up overhead.    Mobility   Mobility Status History of falls   Written Expression   Dominant Hand Right   Vision - History   Baseline Vision No visual deficits   Cognition  Overall Cognitive Status Within Functional Limits for tasks assessed   ROM / Strength   AROM / PROM / Strength AROM;PROM;Strength   Palpation   Palpation Max fascial restrictions and trigger points in left anterior deltoid, upper trapezius, and scapularis region.    AROM   Overall AROM Comments Assessed standing. IR/ER adducted   AROM Assessment Site Shoulder   Right/Left Shoulder Left   Left Shoulder Flexion 151 Degrees   Left Shoulder ABduction 143 Degrees   Left Shoulder Internal Rotation 80 Degrees   Left Shoulder External Rotation 75 Degrees   PROM   Overall PROM Comments Assessed supine. IR/ER abducted   PROM Assessment Site Shoulder   Right/Left Shoulder Left   Left Shoulder Flexion 165 Degrees   Left Shoulder ABduction 180 Degrees   Left Shoulder Internal  Rotation 77 Degrees   Left Shoulder External Rotation 65 Degrees   Strength   Overall Strength Comments Assessed standing. IR/ER adducted.   Strength Assessment Site Shoulder   Right/Left Shoulder Left   Left Shoulder Flexion 4/5   Left Shoulder ABduction 4+/5   Left Shoulder Internal Rotation 4/5   Left Shoulder External Rotation 4/5                         OT Education - 01/03/15 1031    Education provided Yes   Education Details table slides and shoulder stretches   Person(s) Educated Patient   Methods Explanation;Demonstration;Handout   Comprehension Verbalized understanding;Returned demonstration          OT Short Term Goals - 01/03/15 1323    OT SHORT TERM GOAL #1   Title Patient will be educated on HEP.   Time 3   Period Weeks   Status New   OT SHORT TERM GOAL #2   Title Patient will increase PROM to WNL to increase ability to reach into overhead cabinets with less difficulty.    Time 3   Period Weeks   Status New   OT SHORT TERM GOAL #3   Title Patient will decrease pain level to 3/10 during daily tasks.    Time 3   Period Weeks   Status New   OT SHORT TERM GOAL #4   Title Patient will decrease fascial restrictions in min amount.    Time 3   Period Weeks   Status New   OT SHORT TERM GOAL #5   Title Patient will increase LUE strength to 4+/5 to increase ability to complete household chores.    Time 3   Period Weeks   Status New           OT Long Term Goals - 01/03/15 1325    OT LONG TERM GOAL #1   Title Patient will return to highest level of independence with all daily and leisure tasks.    Time 6   Period Weeks   Status New   OT LONG TERM GOAL #2   Title Patient will increase AROM to WNL to increase ability to complete daily tasks with less difficulty.    Time 6   Period Weeks   Status New   OT LONG TERM GOAL #3   Title Patient will decrease pain level to 2/10 or less during daily tasks.    Time 6   Period Weeks   Status  New   OT LONG TERM GOAL #4   Title Patient will decrease fascial restrctions to min amount or less.    Time 6  Period Weeks   Status New   OT LONG TERM GOAL #5   Title Patient will increase LUE strength to 5/5 to increase ability to return to completing yardwork.    Time 6   Period Weeks   Status New               Plan - 01-31-2015 1320    Clinical Impression Statement A: Patient is a 59 y/o female s/p left rotator cuff syndrome causing increased fascial restrictions and pain and decreased strength and ROM resulting in difficulty completing daily tasks using LUE.   Pt will benefit from skilled therapeutic intervention in order to improve on the following deficits (Retired) Decreased range of motion;Increased fascial restricitons;Pain;Decreased strength   Rehab Potential Excellent   OT Frequency 2x / week   OT Duration 6 weeks   OT Treatment/Interventions Self-care/ADL training;Therapeutic exercise;Patient/family education;Manual Therapy;Neuromuscular education;Ultrasound;Iontophoresis;Therapeutic activities;Cryotherapy;DME and/or AE instruction;Electrical Stimulation;Passive range of motion;Moist Heat   Plan P: Patient will benefit from skilled OT services to increase functional use of LUE during daily tasks. Treatment Plan: Myofascial release, muscle energy technique, passive stretching, AAROM, AROM, general strengthening, modalities as needed.    Consulted and Agree with Plan of Care Patient          G-Codes - 31-Jan-2015 1327    Functional Assessment Tool Used FOTO score: 45/100 (55% impaired)   Functional Limitation Carrying, moving and handling objects   Carrying, Moving and Handling Objects Current Status (Z6109) At least 40 percent but less than 60 percent impaired, limited or restricted   Carrying, Moving and Handling Objects Goal Status (U0454) At least 1 percent but less than 20 percent impaired, limited or restricted      Problem List Patient Active Problem List    Diagnosis Date Noted  . Rotator cuff syndrome of left shoulder 12/19/2014  . Difficulty in walking 05/06/2012  . Stiffness of vertebral column 05/06/2012  . Decreased strength 05/06/2012  . Morbid obesity 04/16/2012  . MDD (major depressive disorder) 04/16/2012  . Essential hypertension, benign 04/16/2012  . Back pain 04/16/2012  . Cervical strain, acute 04/16/2012    Ailene Ravel, OTR/L,CBIS  [phone]  31-Jan-2015, 1:30 PM  [place] [address] [place], [place], [address] Phone: [phone]   Fax:  [phone]

## 2015-01-03 NOTE — Patient Instructions (Signed)
SHOULDER: Flexion On Table   Place hands on table, elbows straight. Move hips away from body. Press hands down into table. Hold __5_ seconds. __10_ reps per set, ___ sets per day, ___ days per week  Abduction (Passive)   With arm out to side, resting on table, lower head toward arm, keeping trunk away from table. Hold __5__ seconds. Repeat _10___ times. Do ____ sessions per day.  Copyright  VHI. All rights reserved.     Internal Rotation (Assistive)   Seated with elbow bent at right angle and held against side, slide arm on table surface in an inward arc. Repeat __5__ times. Do __10__ sessions per day. Activity: Use this motion to brush crumbs off the table.  Copyright  VHI. All rights reserved.    Flexibility: Corner Stretch   Standing in corner with hands just above shoulder level and feet ____ inches from corner, lean forward until a comfortable stretch is felt across chest. Hold __5__ seconds. Repeat _10___ times per set. Do ____ sets per session. Do ____ sessions per day.  http://orth.exer.us/342   Copyright  VHI. All rights reserved.   Scapular Retraction (Standing)   With arms at sides, pinch shoulder blades together. Repeat __10__ times per set. Do ____ sets per session. Do ____ sessions per day.  http://orth.exer.us/944   Copyright  VHI. All rights reserved.   ROM: Towel Stretch - with Interior Rotation   Pull left arm up behind back by pulling towel up with other arm. Hold __5__ seconds. Repeat _10___ times per set. Do ____ sets per session. Do ____ sessions per day.  http://orth.exer.us/888   Copyright  VHI. All rights reserved.   Posterior Capsule Stretch   Stand or sit, one arm across body so hand rests over opposite shoulder. Gently push on crossed elbow with other hand until stretch is felt in shoulder of crossed arm. Hold _5__ seconds.  Repeat 10___ times per session. Do ___ sessions per day.  Copyright  VHI. All rights reserved.    Flexors Stretch, Standing   Stand near wall and slide arm up, with palm facing away from wall, by leaning toward wall. Hold _5__ seconds.  Repeat 10___ times per session. Do ___ sessions per day.  Copyright  VHI. All rights reserved.

## 2015-01-04 ENCOUNTER — Encounter (HOSPITAL_COMMUNITY): Payer: Self-pay

## 2015-01-04 ENCOUNTER — Ambulatory Visit (HOSPITAL_COMMUNITY): Payer: Medicare Other

## 2015-01-04 DIAGNOSIS — M25612 Stiffness of left shoulder, not elsewhere classified: Secondary | ICD-10-CM | POA: Diagnosis not present

## 2015-01-04 DIAGNOSIS — M6289 Other specified disorders of muscle: Secondary | ICD-10-CM

## 2015-01-04 DIAGNOSIS — M629 Disorder of muscle, unspecified: Secondary | ICD-10-CM

## 2015-01-04 DIAGNOSIS — M25512 Pain in left shoulder: Secondary | ICD-10-CM

## 2015-01-04 DIAGNOSIS — M7582 Other shoulder lesions, left shoulder: Secondary | ICD-10-CM | POA: Diagnosis not present

## 2015-01-04 NOTE — Therapy (Signed)
[place], [place], [address] Phone: [phone]   Fax:  [phone]  Occupational Therapy Treatment  Patient Details  Name: Sheryl [name] MRN: [id number] Date of Birth: 12-Mar-1956 Referring Provider:  Carole Civil, MD  Encounter Date: 01/04/2015      OT End of Session - 01/04/15 1059    Visit Number 2   Number of Visits 12   Date for OT Re-Evaluation 02/28/15  mini reasses: 01/31/15   Authorization Type Medicare primary Medicaid secondary   Authorization Time Period before 10th visit   Authorization - Visit Number 2   Authorization - Number of Visits 10   OT Start Time 0800   OT Stop Time 0855   OT Time Calculation (min) 55 min   Activity Tolerance Patient tolerated treatment well   Behavior During Therapy [facility] for tasks assessed/performed      Past Medical History  Diagnosis Date  . Depression   . Chronic neck pain 2013  . Chronic back pain 2013    Past Surgical History  Procedure Laterality Date  . Breast cyst excision      right  . Tubal ligation      There were no vitals filed for this visit.  Visit Diagnosis:  Pain in joint, shoulder region, left  Decreased range of motion of left shoulder  Tight fascia      Subjective Assessment - 01/04/15 0823    Symptoms S: Pain varies and can get as high as a 10.   Currently in Pain? Yes   Pain Score 8    Pain Location Shoulder   Pain Orientation Left   Pain Type Chronic pain            [facility] OT Assessment - 01/04/15 0828    Assessment   Diagnosis Left rotator cuff syndrome   Precautions   Precautions None                OT Treatments/Exercises (OP) - 01/04/15 0830    Exercises   Exercises Shoulder   Shoulder Exercises: Supine   Protraction PROM;5 reps;AAROM;10 reps   Horizontal ABduction PROM;5 reps;AAROM;10 reps   External Rotation PROM;5 reps;AAROM;10 reps   Internal Rotation PROM;5 reps;AAROM;10 reps   Flexion PROM;5 reps;AAROM;10 reps   ABduction PROM;5 reps;AAROM;10 reps   Shoulder Exercises: Seated   Elevation AROM;10 reps   Extension AROM;10 reps   Row AROM;10 reps   Protraction AROM;10 reps   Shoulder Exercises: Pulleys   Flexion 1 minute   ABduction 1 minute   Modalities   Modalities Moist Heat   Moist Heat Therapy   Number Minutes Moist Heat 10 Minutes   Moist Heat Location Shoulder   Manual Therapy   Manual Therapy Myofascial release   Myofascial Release Myofascial release and manual stretching to left upper arm trapezius and scapularis region to decrease fascial restrictions and increase joint mobility in a pain free zone Muscle energy technique to anterior deltoid to relax tone and increase joint mobility.                OT Education - 01/03/15 1031    Education provided Yes   Education Details table slides and shoulder stretches   Person(s) Educated Patient   Methods Explanation;Demonstration;Handout   Comprehension Verbalized understanding;Returned demonstration          OT Short Term Goals - 01/04/15 1102    OT SHORT TERM GOAL #1   Title Patient will be  educated on HEP.   Status On-going   OT SHORT TERM GOAL #2   Title Patient will increase PROM to WNL to increase ability to reach into overhead cabinets with less difficulty.    Status On-going   OT SHORT TERM GOAL #3   Title Patient will decrease pain level to 3/10 during daily tasks.    Status On-going   OT SHORT TERM GOAL #4   Title Patient will decrease fascial restrictions in min amount.    Status On-going   OT SHORT TERM GOAL #5   Title Patient will increase LUE strength to 4+/5 to increase ability to complete household chores.    Status On-going           OT Long Term Goals - 01/04/15 1103    OT LONG TERM GOAL #1   Title Patient will return to highest level of independence with all daily and leisure tasks.    Status On-going   OT LONG TERM GOAL #2   Title Patient will increase  AROM to WNL to increase ability to complete daily tasks with less difficulty.    Status On-going   OT LONG TERM GOAL #3   Title Patient will decrease pain level to 2/10 or less during daily tasks.    Status On-going   OT LONG TERM GOAL #4   Title Patient will decrease fascial restrctions to min amount or less.    Status On-going   OT LONG TERM GOAL #5   Title Patient will increase LUE strength to 5/5 to increase ability to return to completing yardwork.    Status On-going               Plan - 01/04/15 1100    Clinical Impression Statement A: Initiated myofascial release, manual stretching, muscle energy technique, AAROM, and pulleys. Pt tolerated well.   Plan P: add AAROM seated and wall wash.          G-Codes - 01-27-2015 1327    Functional Assessment Tool Used FOTO score: 45/100 (55% impaired)   Functional Limitation Carrying, moving and handling objects   Carrying, Moving and Handling Objects Current Status (E9528) At least 40 percent but less than 60 percent impaired, limited or restricted   Carrying, Moving and Handling Objects Goal Status (U1324) At least 1 percent but less than 20 percent impaired, limited or restricted      Problem List Patient Active Problem List   Diagnosis Date Noted  . Rotator cuff syndrome of left shoulder 12/19/2014  . Difficulty in walking 05/06/2012  . Stiffness of vertebral column 05/06/2012  . Decreased strength 05/06/2012  . Morbid obesity 04/16/2012  . MDD (major depressive disorder) 04/16/2012  . Essential hypertension, benign 04/16/2012  . Back pain 04/16/2012  . Cervical strain, acute 04/16/2012    Ailene Ravel, OTR/L,CBIS  [phone]  01/04/2015, 11:06 AM  [place], [place], [address] Phone: [phone]   Fax:  [phone]

## 2015-01-10 ENCOUNTER — Encounter (HOSPITAL_COMMUNITY): Payer: Self-pay

## 2015-01-10 ENCOUNTER — Ambulatory Visit (HOSPITAL_COMMUNITY): Payer: Medicare Other

## 2015-01-10 DIAGNOSIS — M25512 Pain in left shoulder: Secondary | ICD-10-CM | POA: Diagnosis not present

## 2015-01-10 DIAGNOSIS — M25612 Stiffness of left shoulder, not elsewhere classified: Secondary | ICD-10-CM

## 2015-01-10 DIAGNOSIS — M6289 Other specified disorders of muscle: Secondary | ICD-10-CM

## 2015-01-10 DIAGNOSIS — M7582 Other shoulder lesions, left shoulder: Secondary | ICD-10-CM | POA: Diagnosis not present

## 2015-01-10 DIAGNOSIS — M629 Disorder of muscle, unspecified: Secondary | ICD-10-CM

## 2015-01-10 NOTE — Therapy (Signed)
[place], [place], [address] Phone: [phone]   Fax:  [phone]  Occupational Therapy Treatment  Patient Details  Name: Sheryl [name] MRN: [id number] Date of Birth: 1956-03-04 Referring Provider:  Carole Civil, MD  Encounter Date: 01/10/2015      OT End of Session - 01/10/15 1025    Visit Number 3   Number of Visits 12   Date for OT Re-Evaluation 02/28/15  mini reasses: 01/31/15   Authorization Type Medicare primary Medicaid secondary   Authorization Time Period before 10th visit   Authorization - Visit Number 3   Authorization - Number of Visits 10   OT Start Time 0803   OT Stop Time 0847   OT Time Calculation (min) 44 min   Activity Tolerance Patient tolerated treatment well   Behavior During Therapy [facility] for tasks assessed/performed      Past Medical History  Diagnosis Date  . Depression   . Chronic neck pain 2013  . Chronic back pain 2013    Past Surgical History  Procedure Laterality Date  . Breast cyst excision      right  . Tubal ligation      There were no vitals filed for this visit.  Visit Diagnosis:  Pain in joint, shoulder region, left  Decreased range of motion of left shoulder  Tight fascia      Subjective Assessment - 01/10/15 0817    Symptoms S: It's still catching a lot.    Currently in Pain? No/denies  only with moving it overhead. 10/10            [facility] OT Assessment - 01/10/15 0836    Assessment   Diagnosis Left rotator cuff syndrome   Precautions   Precautions None                OT Treatments/Exercises (OP) - 01/10/15 0836    Exercises   Exercises Shoulder   Shoulder Exercises: Supine   Protraction PROM;5 reps;AAROM;10 reps   Horizontal ABduction PROM;5 reps;AAROM;10 reps   External Rotation PROM;5 reps;AAROM;10 reps   Internal Rotation PROM;5 reps;AAROM;10 reps   Flexion PROM;5 reps;AAROM;10 reps   Flexion Limitations Only  going to shoulder height range due to increased pain with shoulder hooking.    ABduction PROM;5 reps;AAROM;10 reps   Shoulder Exercises: Seated   Elevation AROM;10 reps   Extension AROM;10 reps   Row AROM;10 reps   Protraction AAROM;10 reps   Manual Therapy   Manual Therapy Myofascial release   Myofascial Release Myofascial release and manual stretching to left upper arm trapezius and scapularis region to decrease fascial restrictions and increase joint mobility in a pain free zone Muscle energy technique to anterior deltoid to relax tone and increase joint mobility.                  OT Short Term Goals - 01/04/15 1102    OT SHORT TERM GOAL #1   Title Patient will be educated on HEP.   Status On-going   OT SHORT TERM GOAL #2   Title Patient will increase PROM to WNL to increase ability to reach into overhead cabinets with less difficulty.    Status On-going   OT SHORT TERM GOAL #3   Title Patient will decrease pain level to 3/10 during daily tasks.    Status On-going   OT SHORT TERM GOAL #4   Title Patient will decrease fascial restrictions in min amount.  Status On-going   OT SHORT TERM GOAL #5   Title Patient will increase LUE strength to 4+/5 to increase ability to complete household chores.    Status On-going           OT Long Term Goals - 01/04/15 1103    OT LONG TERM GOAL #1   Title Patient will return to highest level of independence with all daily and leisure tasks.    Status On-going   OT LONG TERM GOAL #2   Title Patient will increase AROM to WNL to increase ability to complete daily tasks with less difficulty.    Status On-going   OT LONG TERM GOAL #3   Title Patient will decrease pain level to 2/10 or less during daily tasks.    Status On-going   OT LONG TERM GOAL #4   Title Patient will decrease fascial restrctions to min amount or less.    Status On-going   OT LONG TERM GOAL #5   Title Patient will increase LUE strength to 5/5 to increase  ability to return to completing yardwork.    Status On-going               Plan - 01/10/15 5409    Clinical Impression Statement A: During passive stretching patient was able to achieve full ROM during flexion although when bringing arm down during extension she experienced a severe catch and was unable to continue through range without increased pain. Reviewed X-ray results from Dr. Aline Brochure with the impression statement: The acromion has a hook making it a type III and the supraspinatus outlet appears to be narrowed. There is a inferior sport spur of the ischial clavicle. Discussed results with patient and explained what was happening in her shoulder with movement. During passive stretching and AAROM patient did not complete full range during shoulder flexion.    Plan P: Avoid excessive overhead activities. Avoid full ROM during shoulder flexion. Attempt to complete remaining AAROM seated exercises.        Problem List Patient Active Problem List   Diagnosis Date Noted  . Rotator cuff syndrome of left shoulder 12/19/2014  . Difficulty in walking 05/06/2012  . Stiffness of vertebral column 05/06/2012  . Decreased strength 05/06/2012  . Morbid obesity 04/16/2012  . MDD (major depressive disorder) 04/16/2012  . Essential hypertension, benign 04/16/2012  . Back pain 04/16/2012  . Cervical strain, acute 04/16/2012    Ailene Ravel, OTR/L,CBIS  [phone]  01/10/2015, 10:27 AM  [place] [address] [place], [place], [address] Phone: [phone]   Fax:  [phone]

## 2015-01-13 ENCOUNTER — Ambulatory Visit (HOSPITAL_COMMUNITY): Payer: Medicare Other | Admitting: Specialist

## 2015-01-13 DIAGNOSIS — M7582 Other shoulder lesions, left shoulder: Secondary | ICD-10-CM | POA: Diagnosis not present

## 2015-01-13 DIAGNOSIS — M25612 Stiffness of left shoulder, not elsewhere classified: Secondary | ICD-10-CM

## 2015-01-13 DIAGNOSIS — M25512 Pain in left shoulder: Secondary | ICD-10-CM | POA: Diagnosis not present

## 2015-01-13 NOTE — Therapy (Signed)
[place], [place], [address] Phone: [phone]   Fax:  [phone]  Occupational Therapy Treatment  Patient Details  Name: Sheryl [name] MRN: [id number] Date of Birth: Jul 25, 1956 Referring Provider:  Carole Civil, MD  Encounter Date: 01/13/2015      OT End of Session - 01/13/15 1151    Visit Number 4   Date for OT Re-Evaluation 03/04/15 mini reassess 01/31/15   Authorization Type Medicare primary Medicaid secondary   Authorization Time Period before 10th visit   Authorization - Visit Number 4   Authorization - Number of Visits 10   OT Start Time 1112   OT Stop Time 1140   OT Time Calculation (min) 28 min   Activity Tolerance Patient tolerated treatment well   Behavior During Therapy [facility] for tasks assessed/performed      Past Medical History  Diagnosis Date  . Depression   . Chronic neck pain 2013  . Chronic back pain 2013    Past Surgical History  Procedure Laterality Date  . Breast cyst excision      right  . Tubal ligation      There were no vitals filed for this visit.  Visit Diagnosis:  Pain in joint, shoulder region, left  Decreased range of motion of left shoulder      Subjective Assessment - 01/13/15 1149    Symptoms S:  It feels better today            [facility] OT Assessment - 01/13/15 0001    Assessment   Diagnosis Left rotator cuff syndrome   Precautions   Precautions None                OT Treatments/Exercises (OP) - 01/13/15 0001    Exercises   Exercises Shoulder   Shoulder Exercises: Supine   Protraction PROM;10 reps;AAROM;12 reps   Horizontal ABduction PROM;10 reps;AAROM;12 reps   External Rotation PROM;10 reps;AAROM;12 reps   Internal Rotation PROM;10 reps;AAROM;12 reps   Flexion PROM;10 reps;AAROM;12 reps   ABduction PROM;10 reps;AAROM;5 reps  stopped AAROM due to increased pain   Shoulder Exercises: Seated   Elevation AROM;15 reps   Extension AROM;15 reps   Row AROM;15 reps   Protraction AAROM;10 reps   Horizontal ABduction AAROM;10 reps   External Rotation AAROM;10 reps   Internal Rotation AAROM;10 reps   Flexion AAROM;10 reps   Abduction AAROM;10 reps   Manual Therapy   Manual Therapy Myofascial release   Myofascial Release Myofascial release and manual stretching to left upper arm trapezius and scapularis region to decrease fascial restrictions and increase joint mobility in a pain free zone Muscle energy technique to anterior deltoid to relax tone and increase joint mobility.                OT Education - 01/13/15 1151    Education provided Yes   Education Details discussed starting a walking program, to start slow and build distance slowly, fine to swing arms while walking   Person(s) Educated Patient   Methods Explanation   Comprehension Verbalized understanding          OT Short Term Goals - 01/04/15 1102    OT SHORT TERM GOAL #1   Title Patient will be educated on HEP.   Status On-going   OT SHORT TERM GOAL #2   Title Patient will increase PROM to WNL to increase ability to reach into overhead cabinets with less difficulty.    Status  On-going   OT SHORT TERM GOAL #3   Title Patient will decrease pain level to 3/10 during daily tasks.    Status On-going   OT SHORT TERM GOAL #4   Title Patient will decrease fascial restrictions in min amount.    Status On-going   OT SHORT TERM GOAL #5   Title Patient will increase LUE strength to 4+/5 to increase ability to complete household chores.    Status On-going           OT Long Term Goals - 01/04/15 1103    OT LONG TERM GOAL #1   Title Patient will return to highest level of independence with all daily and leisure tasks.    Status On-going   OT LONG TERM GOAL #2   Title Patient will increase AROM to WNL to increase ability to complete daily tasks with less difficulty.    Status On-going   OT LONG TERM GOAL #3   Title Patient will  decrease pain level to 2/10 or less during daily tasks.    Status On-going   OT LONG TERM GOAL #4   Title Patient will decrease fascial restrctions to min amount or less.    Status On-going   OT LONG TERM GOAL #5   Title Patient will increase LUE strength to 5/5 to increase ability to return to completing yardwork.    Status On-going               Plan - 01/13/15 1152    Clinical Impression Statement A:  Patient requested to leave early this date.  Discussed benefits of walking program and able to complete AAROM in seated this date.    Plan P:  attempt AROM in supine.        Problem List Patient Active Problem List   Diagnosis Date Noted  . Rotator cuff syndrome of left shoulder 12/19/2014  . Difficulty in walking 05/06/2012  . Stiffness of vertebral column 05/06/2012  . Decreased strength 05/06/2012  . Morbid obesity 04/16/2012  . MDD (major depressive disorder) 04/16/2012  . Essential hypertension, benign 04/16/2012  . Back pain 04/16/2012  . Cervical strain, acute 04/16/2012    Vangie Bicker, OTR/L [phone] 11:55 am [facility] [address] [place], [place], [address] Phone: [phone]   Fax:  [phone]

## 2015-01-16 ENCOUNTER — Ambulatory Visit (INDEPENDENT_AMBULATORY_CARE_PROVIDER_SITE_OTHER): Payer: Medicare Other | Admitting: Family Medicine

## 2015-01-16 ENCOUNTER — Encounter: Payer: Self-pay | Admitting: Family Medicine

## 2015-01-16 VITALS — BP 144/88 | HR 76 | Temp 98.1°F | Resp 16 | Ht 62.0 in | Wt 259.0 lb

## 2015-01-16 DIAGNOSIS — F331 Major depressive disorder, recurrent, moderate: Secondary | ICD-10-CM | POA: Diagnosis not present

## 2015-01-16 DIAGNOSIS — M75102 Unspecified rotator cuff tear or rupture of left shoulder, not specified as traumatic: Secondary | ICD-10-CM

## 2015-01-16 DIAGNOSIS — I10 Essential (primary) hypertension: Secondary | ICD-10-CM

## 2015-01-16 MED ORDER — MELOXICAM 7.5 MG PO TABS: 7.5000 mg | ORAL_TABLET | Freq: Every day | ORAL | Status: DC

## 2015-01-16 MED ORDER — HYDROCODONE-ACETAMINOPHEN 5-325 MG PO TABS: 1.0000 | ORAL_TABLET | Freq: Four times a day (QID) | ORAL | Status: DC | PRN

## 2015-01-16 NOTE — Assessment & Plan Note (Signed)
Discussed the importance of taking medication on a daily basis to help with her mood. She will restart Lexapro 10 mg

## 2015-01-16 NOTE — Assessment & Plan Note (Signed)
She'll return for a physical examination in 3 months at that time we do her fasting labs.

## 2015-01-16 NOTE — Assessment & Plan Note (Signed)
He would continue the meloxicam I've given her hydrocodone No. 30 tablets to use after physical therapy. She will follow with orthopedics for further treatment

## 2015-01-16 NOTE — Progress Notes (Signed)
Patient ID: Sheryl [name], female   DOB: [birth year], 59 y.o.   MRN: [id number]   Subjective:    Patient ID: Sheryl [name], female    DOB: 1956/01/09, 59 y.o.   MRN: [id number]  Patient presents for 4 week F/U  Patient here for interim follow-up from her last visit. Her hypertension she was started on lisinopril HCTZ she states that she has been taken daily but at different times because it made her feel little sick on the stomach. She's not had any nausea or vomiting no cough. Her weight is actually down 3 pounds since her last visit.  Depression she's not been taking the Lexapro every day she started with as needed but does notice a difference when she does take it.  Rotator cuff syndrome she was seen by orthopedics she is currently in physical therapy she also had steroid injection done into the joint which has helped some. She is taking the meloxicam but asked for something stronger after her therapy sessions and she gets a lot of pain in her shoulder. She has follow-up with orthopedics in 4 weeks   Review Of Systems:  GEN- denies fatigue, fever, weight loss,weakness, recent illness HEENT- denies eye drainage, change in vision, nasal discharge, CVS- denies chest pain, palpitations RESP- denies SOB, cough, wheeze ABD- denies N/V, change in stools, abd pain GU- denies dysuria, hematuria, dribbling, incontinence MSK- +oint pain, muscle aches, injury Neuro- denies headache, dizziness, syncope, seizure activity       Objective:    BP 144/88 mmHg  Pulse 76  Temp(Src) 98.1 F (36.7 C) (Oral)  Resp 16  Ht 5\' 2"  (1.575 m)  Wt 259 lb (117.482 kg)  BMI 47.36 kg/m2  LMP 12/20/2011 GEN- NAD, alert and oriented x3 HEENT- PERRL, EOMI, non injected sclera, pink conjunctiva, MMM, oropharynx clear CVS- RRR, no murmur RESP-CTAB Psych- flat depressed affect, no SI, good eye contact, well groomed EXT- No edema Pulses- Radial, DP- 2+        Assessment & Plan:      Problem List  Items Addressed This Visit      Unprioritized   Rotator cuff syndrome of left shoulder   Morbid obesity   MDD (major depressive disorder) - Primary   Essential hypertension, benign      Note: This dictation was prepared with Dragon dictation along with smaller phrase technology. Any transcriptional errors that result from this process are unintentional.

## 2015-01-16 NOTE — Assessment & Plan Note (Signed)
Blood pressure is improved today though still elevated. I will have her take the blood pressure medication at bedtime I'm not going to increase the dose of concern that she will actually stopped taking the medication like she has done in the past.

## 2015-01-16 NOTE — Patient Instructions (Signed)
Take the blood pressure pill at bedtime Take the lexapro every day for your mood Hydrocodone for pain F/U 3 months

## 2015-01-17 ENCOUNTER — Encounter (HOSPITAL_COMMUNITY): Payer: Self-pay

## 2015-01-17 ENCOUNTER — Ambulatory Visit (HOSPITAL_COMMUNITY): Payer: Medicare Other

## 2015-01-17 DIAGNOSIS — M6289 Other specified disorders of muscle: Secondary | ICD-10-CM

## 2015-01-17 DIAGNOSIS — M25512 Pain in left shoulder: Secondary | ICD-10-CM

## 2015-01-17 DIAGNOSIS — M25612 Stiffness of left shoulder, not elsewhere classified: Secondary | ICD-10-CM | POA: Diagnosis not present

## 2015-01-17 DIAGNOSIS — M7582 Other shoulder lesions, left shoulder: Secondary | ICD-10-CM | POA: Diagnosis not present

## 2015-01-17 DIAGNOSIS — M629 Disorder of muscle, unspecified: Secondary | ICD-10-CM

## 2015-01-17 NOTE — Therapy (Signed)
[place], [place], [address] Phone: [phone]   Fax:  [phone]  Occupational Therapy Treatment  Patient Details  Name: Sheryl [name] MRN: [id number] Date of Birth: May 20, 1956 Referring Provider:  Carole Civil, MD  Encounter Date: 01/17/2015      OT End of Session - 01/17/15 0856    Visit Number 5   Number of Visits 12   Date for OT Re-Evaluation 03/04/15   Authorization Type Medicare primary Medicaid secondary   Authorization Time Period before 10th visit   Authorization - Visit Number 5   Authorization - Number of Visits 10   OT Start Time 0805   OT Stop Time 0852   OT Time Calculation (min) 47 min   Activity Tolerance Patient tolerated treatment well   Behavior During Therapy [facility] for tasks assessed/performed      Past Medical History  Diagnosis Date  . Depression   . Chronic neck pain 2013  . Chronic back pain 2013    Past Surgical History  Procedure Laterality Date  . Breast cyst excision      right  . Tubal ligation      There were no vitals filed for this visit.  Visit Diagnosis:  Pain in joint, shoulder region, left  Decreased range of motion of left shoulder  Tight fascia      Subjective Assessment - 01/17/15 0848    Symptoms S: Pt stated "My arm doesn't "catch" like it did before."   Currently in Pain? No/denies            [facility] OT Assessment - 01/17/15 0823    Assessment   Diagnosis Left rotator cuff syndrome   Precautions   Precautions None                OT Treatments/Exercises (OP) - 01/17/15 0823    Exercises   Exercises Shoulder   Shoulder Exercises: Supine   Protraction PROM;5 reps;AROM;10 reps   Horizontal ABduction PROM;5 reps;AROM;10 reps   External Rotation PROM;5 reps;AROM;10 reps   Internal Rotation PROM;5 reps;AROM;10 reps   Flexion PROM;5 reps;AROM;10 reps   ABduction PROM;5 reps;AROM;10 reps   Shoulder Exercises: Seated   Elevation AROM;15 reps   Extension AROM;15 reps   Row AROM;15 reps   Protraction AROM;10 reps   Horizontal ABduction AROM;10 reps   External Rotation AROM;10 reps   Internal Rotation AROM;10 reps   Flexion AROM;10 reps   Abduction AROM;10 reps   Shoulder Exercises: ROM/Strengthening   UBE (Upper Arm Bike) Level 1 3' forward 3' backward   Wall Wash 1'   Manual Therapy   Manual Therapy Myofascial release   Myofascial Release Myofascial release and manual stretching to left upper arm trapezius and scapularis region to decrease fascial restrictions and increase joint mobility in a pain free zone                   OT Short Term Goals - 01/04/15 1102    OT SHORT TERM GOAL #1   Title Patient will be educated on HEP.   Status On-going   OT SHORT TERM GOAL #2   Title Patient will increase PROM to WNL to increase ability to reach into overhead cabinets with less difficulty.    Status On-going   OT SHORT TERM GOAL #3   Title Patient will decrease pain level to 3/10 during daily tasks.    Status On-going   OT SHORT TERM GOAL #4  Title Patient will decrease fascial restrictions in min amount.    Status On-going   OT SHORT TERM GOAL #5   Title Patient will increase LUE strength to 4+/5 to increase ability to complete household chores.    Status On-going           OT Long Term Goals - 01/04/15 1103    OT LONG TERM GOAL #1   Title Patient will return to highest level of independence with all daily and leisure tasks.    Status On-going   OT LONG TERM GOAL #2   Title Patient will increase AROM to WNL to increase ability to complete daily tasks with less difficulty.    Status On-going   OT LONG TERM GOAL #3   Title Patient will decrease pain level to 2/10 or less during daily tasks.    Status On-going   OT LONG TERM GOAL #4   Title Patient will decrease fascial restrctions to min amount or less.    Status On-going   OT LONG TERM GOAL #5   Title Patient will increase LUE  strength to 5/5 to increase ability to return to completing yardwork.    Status On-going               Plan - 01/17/15 8250    Clinical Impression Statement A: Added AROM in supine and seated. Added wall wash exercise and UBE bike.  Pt tolerated these exercises well.  Pt's shoulder is not "catching" when performing shoulder flexion like it did in previous visits.  Pt received verbal cue to keep elbow straight when performing horizontal abduction AROM while seated.   Plan P: Increase AROM reps as tolerated.        Problem List Patient Active Problem List   Diagnosis Date Noted  . Rotator cuff syndrome of left shoulder 12/19/2014  . Difficulty in walking 05/06/2012  . Stiffness of vertebral column 05/06/2012  . Decreased strength 05/06/2012  . Morbid obesity 04/16/2012  . MDD (major depressive disorder) 04/16/2012  . Essential hypertension, benign 04/16/2012  . Back pain 04/16/2012  . Cervical strain, acute 04/16/2012    Elba Barman, OTA Student [phone] 01/17/2015, 9:34 AM  [facility] [address] [place], [place], [address] Phone: [phone]   Fax:  [phone]

## 2015-01-20 ENCOUNTER — Telehealth (HOSPITAL_COMMUNITY): Payer: Self-pay

## 2015-01-20 ENCOUNTER — Ambulatory Visit (HOSPITAL_COMMUNITY): Payer: Medicare Other

## 2015-01-20 NOTE — Telephone Encounter (Signed)
She can not come in today, she is not feeling well

## 2015-01-24 ENCOUNTER — Encounter (HOSPITAL_COMMUNITY): Payer: Self-pay

## 2015-01-24 ENCOUNTER — Ambulatory Visit (HOSPITAL_COMMUNITY): Payer: Medicare Other | Attending: Orthopedic Surgery

## 2015-01-24 DIAGNOSIS — M7582 Other shoulder lesions, left shoulder: Secondary | ICD-10-CM | POA: Insufficient documentation

## 2015-01-24 DIAGNOSIS — M25612 Stiffness of left shoulder, not elsewhere classified: Secondary | ICD-10-CM | POA: Insufficient documentation

## 2015-01-24 DIAGNOSIS — M25512 Pain in left shoulder: Secondary | ICD-10-CM | POA: Diagnosis not present

## 2015-01-24 DIAGNOSIS — M629 Disorder of muscle, unspecified: Secondary | ICD-10-CM

## 2015-01-24 DIAGNOSIS — M6289 Other specified disorders of muscle: Secondary | ICD-10-CM

## 2015-01-24 NOTE — Therapy (Signed)
[place], [place], [address] Phone: [phone]   Fax:  [phone]  Occupational Therapy Treatment  Patient Details  Name: Sheryl [name] MRN: [id number] Date of Birth: 1956-06-07 Referring Provider:  Carole Civil, MD  Encounter Date: 01/24/2015      OT End of Session - 01/24/15 0841    Visit Number 6   Number of Visits 12   Date for OT Re-Evaluation 03/04/15   Authorization Type Medicare primary Medicaid secondary   Authorization Time Period before 10th visit   Authorization - Visit Number 6   Authorization - Number of Visits 10   OT Start Time [phone]   OT Stop Time 0844   OT Time Calculation (min) 33 min   Activity Tolerance Patient tolerated treatment well   Behavior During Therapy [facility] for tasks assessed/performed      Past Medical History  Diagnosis Date  . Depression   . Chronic neck pain 2013  . Chronic back pain 2013    Past Surgical History  Procedure Laterality Date  . Breast cyst excision      right  . Tubal ligation      There were no vitals filed for this visit.  Visit Diagnosis:  Pain in joint, shoulder region, left  Decreased range of motion of left shoulder  Tight fascia      Subjective Assessment - 01/24/15 0840    Subjective  S: Pt stated "My arm feels like it's tingling just a tiny bit."   Currently in Pain? No/denies            [facility] OT Assessment - 01/24/15 0813    Assessment   Diagnosis Left rotator cuff syndrome   Precautions   Precautions None                OT Treatments/Exercises (OP) - 01/24/15 0813    Exercises   Exercises Shoulder   Shoulder Exercises: Supine   Protraction PROM;5 reps;AROM;12 reps   Horizontal ABduction PROM;5 reps;AROM;12 reps   External Rotation PROM;5 reps;AROM;12 reps   Internal Rotation PROM;5 reps;AROM;12 reps   Flexion PROM;5 reps;AROM;12 reps   ABduction PROM;5 reps;AROM;12 reps   Shoulder Exercises:  Seated   Elevation AROM;15 reps   Extension AROM;15 reps   Row AROM;15 reps   Protraction AROM;12 reps   Horizontal ABduction AROM;12 reps   External Rotation AROM;12 reps   Internal Rotation AROM;12 reps   Flexion AROM;12 reps   Abduction AROM;12 reps   Shoulder Exercises: ROM/Strengthening   UBE (Upper Arm Bike) Level 1 3' forward 3' backward   Manual Therapy   Manual Therapy Myofascial release   Myofascial Release Myofascial release and manual stretching to left upper arm trapezius and scapularis region to decrease fascial restrictions and increase joint mobility in a pain free zone                   OT Short Term Goals - 01/04/15 1102    OT SHORT TERM GOAL #1   Title Patient will be educated on HEP.   Status On-going   OT SHORT TERM GOAL #2   Title Patient will increase PROM to WNL to increase ability to reach into overhead cabinets with less difficulty.    Status On-going   OT SHORT TERM GOAL #3   Title Patient will decrease pain level to 3/10 during daily tasks.    Status On-going   OT SHORT TERM GOAL #4  Title Patient will decrease fascial restrictions in min amount.    Status On-going   OT SHORT TERM GOAL #5   Title Patient will increase LUE strength to 4+/5 to increase ability to complete household chores.    Status On-going           OT Long Term Goals - 01/04/15 1103    OT LONG TERM GOAL #1   Title Patient will return to highest level of independence with all daily and leisure tasks.    Status On-going   OT LONG TERM GOAL #2   Title Patient will increase AROM to WNL to increase ability to complete daily tasks with less difficulty.    Status On-going   OT LONG TERM GOAL #3   Title Patient will decrease pain level to 2/10 or less during daily tasks.    Status On-going   OT LONG TERM GOAL #4   Title Patient will decrease fascial restrctions to min amount or less.    Status On-going   OT LONG TERM GOAL #5   Title Patient will increase LUE  strength to 5/5 to increase ability to return to completing yardwork.    Status On-going               Plan - 01/24/15 3403    Clinical Impression Statement A: Increased AROM repititions for supine and seated.  Pt's arm did not show signs of "catching" this session.  Pt demonstrates good form during exercises.  Pt tolerated exercises well.   Plan P: Add W arms and X to V arms.        Problem List Patient Active Problem List   Diagnosis Date Noted  . Rotator cuff syndrome of left shoulder 12/19/2014  . Difficulty in walking 05/06/2012  . Stiffness of vertebral column 05/06/2012  . Decreased strength 05/06/2012  . Morbid obesity 04/16/2012  . MDD (major depressive disorder) 04/16/2012  . Essential hypertension, benign 04/16/2012  . Back pain 04/16/2012  . Cervical strain, acute 04/16/2012    Elba Barman, OTA Student [phone]  01/24/2015, 8:49 AM  [facility] [address] [place], [place], [address] Phone: [phone]   Fax:  [phone]

## 2015-01-27 ENCOUNTER — Encounter (HOSPITAL_COMMUNITY): Payer: Medicare Other

## 2015-01-31 ENCOUNTER — Ambulatory Visit (HOSPITAL_COMMUNITY): Payer: Medicare Other

## 2015-01-31 DIAGNOSIS — M25612 Stiffness of left shoulder, not elsewhere classified: Secondary | ICD-10-CM | POA: Diagnosis not present

## 2015-01-31 DIAGNOSIS — M6289 Other specified disorders of muscle: Secondary | ICD-10-CM

## 2015-01-31 DIAGNOSIS — M25512 Pain in left shoulder: Secondary | ICD-10-CM | POA: Diagnosis not present

## 2015-01-31 DIAGNOSIS — M629 Disorder of muscle, unspecified: Secondary | ICD-10-CM

## 2015-01-31 DIAGNOSIS — M7582 Other shoulder lesions, left shoulder: Secondary | ICD-10-CM | POA: Diagnosis not present

## 2015-01-31 NOTE — Patient Instructions (Signed)
Strengthening: Chest Pull - Resisted   Hold Theraband in front of body with hands about shoulder width a part. Pull band a part and back together slowly. Repeat _10___ times. Complete _1___ set(s) per session.. Repeat ____ session(s) per day.  http://orth.exer.us/926   Copyright  VHI. All rights reserved.   PNF Strengthening: Resisted   Standing with resistive band around each hand, bring right arm up and away, thumb back. Repeat _10___ times per set. Do __1__ sets per session. Do ____ sessions per day.  http://orth.exer.us/918   Copyright  VHI. All rights reserved.   PNF Strengthening: Resisted   Standing with resistive band around each hand, bring right arm up and across body. Repeat _10___ times per set. Do _1___ sets per session. Do ____ sessions per day.  http://orth.exer.us/920   Copyright  VHI. All rights reserved.    Resisted External Rotation: in Neutral - Bilateral   Sit or stand, tubing in both hands, elbows at sides, bent to 90, forearms forward. Pinch shoulder blades together and rotate forearms out. Keep elbows at sides. Repeat _10___ times per set. Do _1___ sets per session. Do ____ sessions per day.  http://orth.exer.us/966   Copyright  VHI. All rights reserved.   PNF Strengthening: Resisted   Standing, hold resistive band above head. Bring right arm down and out from side. Repeat _10___ times per set. Do __1__ sets per session. Do ____ sessions per day.  http://orth.exer.us/922   Copyright  VHI. All rights reserved.      ROM: Abduction (Standing)   Bring arms straight out from sides and raise as high as possible without pain. Repeat _10___ times per set. Do __1__ sets per session. Do ____ sessions per day.  http://orth.exer.us/910   Copyright  VHI. All rights reserved.   Extension (Active) ROM: Extension (Standing)   Bring arms straight back as far as possible without pain. Repeat 10____ times per set. Do _1___ sets per  session. Do ____ sessions per day.  http://orth.exer.us/916   Copyright  VHI. All rights reserved.   ROM: External / Internal Rotation - in Abduction (Standing)   With upper arms parallel to floor and elbows bent at right angles, gently rotate arms up then down as far as possible without pain. Repeat _10___ times per set. Do __1__ sets per session. Do ____ sessions per day.  http://orth.exer.us/912   Copyright  VHI. All rights reserved.    Flexors Stretch (Active)   Stand, arms straight at sides. Bring arms straight forward and upward as high as possible without pain.  Repeat _10__ times per session. Do ___ sessions per day.  Copyright  VHI. All rights reserved.   Scapular Retraction (Standing)   With arms at sides, pinch shoulder blades together. Repeat _10___ times per set. Do _1___ sets per session. Do ____ sessions per day.  http://orth.exer.us/944   Copyright  VHI. All rights reserved.

## 2015-01-31 NOTE — Therapy (Signed)
[facility], [place], [address] Phone: [phone]   Fax:  [phone]  Occupational Therapy Reassessment and Treatment  Patient Details  Name: Sheryl [name] MRN: [id number] Date of Birth: 11/23/1955 Referring Provider:  Carole Civil, MD  Encounter Date: 01/31/2015      OT End of Session - 01/31/15 0910    Visit Number 7   Number of Visits 12   Authorization Type Medicare primary Medicaid secondary   Authorization Time Period before 17th visit   Authorization - Visit Number 7   Authorization - Number of Visits 10   OT Start Time 0803   OT Stop Time 0900   OT Time Calculation (min) 57 min   Activity Tolerance Patient tolerated treatment well   Behavior During Therapy [facility] for tasks assessed/performed      Past Medical History  Diagnosis Date  . Depression   . Chronic neck pain 2013  . Chronic back pain 2013    Past Surgical History  Procedure Laterality Date  . Breast cyst excision      right  . Tubal ligation      There were no vitals filed for this visit.  Visit Diagnosis:  Decreased range of motion of left shoulder  Tight fascia      Subjective Assessment - 01/31/15 0846    Subjective  S: I'm so happy you got that catch out of my shoulder.   Special Tests FOTO score: 69/100   Currently in Pain? No/denies           [facility] OT Assessment - 01/31/15 0805    Assessment   Diagnosis Left rotator cuff syndrome   Precautions   Precautions None   Palpation   Palpation Min fascial restrictions and trigger point in left subscapularis muscle.   AROM   Overall AROM Comments Assessed standing. IR/ER adducted   AROM Assessment Site Shoulder   Right/Left Shoulder Left   Left Shoulder Flexion 180 Degrees  on eval: 151   Left Shoulder ABduction 172 Degrees  on eval: 143   Left Shoulder Internal Rotation 90 Degrees  on eval: 80   Left Shoulder External Rotation 90 Degrees  on eval: 75    Strength   Overall Strength Comments Assessed standing. IR/ER adducted.   Strength Assessment Site Shoulder   Right/Left Shoulder Left   Left Shoulder Flexion 5/5  on eval: 4/5   Left Shoulder ABduction 5/5  on eval: 4+/5   Left Shoulder Internal Rotation 5/5  on eval: 4/5   Left Shoulder External Rotation 5/5  on eval: 4/5                  OT Treatments/Exercises (OP) - 01/31/15 0812    Exercises   Exercises Shoulder   Shoulder Exercises: Supine   Protraction PROM;5 reps;Strengthening;10 reps   Protraction Weight (lbs) 1   Horizontal ABduction PROM;5 reps;Strengthening;10 reps   Horizontal ABduction Weight (lbs) 1   External Rotation PROM;5 reps;Strengthening;10 reps   External Rotation Weight (lbs) 1   Internal Rotation PROM;5 reps;Strengthening;10 reps   Internal Rotation Weight (lbs) 1   Flexion PROM;5 reps;Strengthening;10 reps   Shoulder Flexion Weight (lbs) 1   ABduction PROM;5 reps;Strengthening;10 reps   Shoulder ABduction Weight (lbs) 1   Shoulder Exercises: ROM/Strengthening   Proximal Shoulder Strengthening, Supine 12X with 1# no rest breaks   Manual Therapy   Manual Therapy Myofascial release   Myofascial Release Myofascial release and manual stretching  to left upper arm trapezius and scapularis region to decrease fascial restrictions and increase joint mobility in a pain free zone                OT Education - 02/08/2015 0844    Education provided Yes   Education Details Red theraband and strengthening exercises   Person(s) Educated Patient   Methods Explanation;Demonstration;Handout   Comprehension Verbalized understanding          OT Short Term Goals - 08-Feb-2015 0832    OT SHORT TERM GOAL #1   Title Patient will be educated on HEP.   Status Achieved   OT SHORT TERM GOAL #2   Title Patient will increase PROM to WNL to increase ability to reach into overhead cabinets with less difficulty.    Status Achieved   OT SHORT TERM GOAL  #3   Title Patient will decrease pain level to 3/10 during daily tasks.    Status Achieved   OT SHORT TERM GOAL #4   Title Patient will decrease fascial restrictions in min amount.    Status Achieved   OT SHORT TERM GOAL #5   Title Patient will increase LUE strength to 4+/5 to increase ability to complete household chores.    Status Achieved           OT Long Term Goals - Feb 08, 2015 0834    OT LONG TERM GOAL #1   Title Patient will return to highest level of independence with all daily and leisure tasks.    Status Achieved   OT LONG TERM GOAL #2   Title Patient will increase AROM to WNL to increase ability to complete daily tasks with less difficulty.    Status Achieved   OT LONG TERM GOAL #3   Title Patient will decrease pain level to 2/10 or less during daily tasks.    Status Achieved   OT LONG TERM GOAL #4   Title Patient will decrease fascial restrctions to min amount or less.    Status Achieved   OT LONG TERM GOAL #5   Title Patient will increase LUE strength to 5/5 to increase ability to return to completing yardwork.    Status Achieved               Plan - February 08, 2015 0911    Clinical Impression Statement A: Reassessment and discharge completed this date. Patient met all STGs and LTGs. Patient was given an upgraded HEP to focus on strengthening at home. Patient reports no pain in left shoulder and she does not experience any catching. Patient does report that she is more cautious when doing things and she hasn't attempted to complete any vigorous activities.    Plan P: D/C from therapy with HEP.          G-Codes - 2015/02/08 0912    Functional Assessment Tool Used FOTO score: 69/100 (31% impaired)   Functional Limitation Carrying, moving and handling objects   Carrying, Moving and Handling Objects Goal Status (W1191) At least 1 percent but less than 20 percent impaired, limited or restricted   Carrying, Moving and Handling Objects Discharge Status [phone]) At least  20 percent but less than 40 percent impaired, limited or restricted      Problem List Patient Active Problem List   Diagnosis Date Noted  . Rotator cuff syndrome of left shoulder 12/19/2014  . Difficulty in walking 05/06/2012  . Stiffness of vertebral column 05/06/2012  . Decreased strength 05/06/2012  . Morbid obesity 04/16/2012  .  MDD (major depressive disorder) 04/16/2012  . Essential hypertension, benign 04/16/2012  . Back pain 04/16/2012  . Cervical strain, acute 04/16/2012    OCCUPATIONAL THERAPY DISCHARGE SUMMARY  Visits from Start of Care: 7  Current functional level related to goals / functional outcomes: See goals above   Remaining deficits: See clinical impression statement above   Education / Equipment: Theraband and strengthening HEP, self myofascial release techniques Plan: Patient agrees to discharge.  Patient goals were met. Patient is being discharged due to meeting the stated rehab goals.  ?????        Ailene Ravel, OTR/L,CBIS  [phone]  01/31/2015, 9:14 AM  [facility] [address] [place], [place], [address] Phone: [phone]   Fax:  [phone]

## 2015-02-03 ENCOUNTER — Encounter (HOSPITAL_COMMUNITY): Payer: Medicare Other

## 2015-02-07 ENCOUNTER — Encounter (HOSPITAL_COMMUNITY): Payer: Medicare Other

## 2015-02-08 ENCOUNTER — Other Ambulatory Visit: Payer: Self-pay | Admitting: *Deleted

## 2015-02-08 MED ORDER — LISINOPRIL-HYDROCHLOROTHIAZIDE 10-12.5 MG PO TABS: 1.0000 | ORAL_TABLET | Freq: Every day | ORAL | Status: DC

## 2015-02-08 NOTE — Telephone Encounter (Signed)
Received fax requesting refill on Lisinopril/HCTZ.   Refill appropriate and filled per protocol.

## 2015-02-10 ENCOUNTER — Encounter (HOSPITAL_COMMUNITY): Payer: Medicare Other

## 2015-02-14 ENCOUNTER — Encounter (HOSPITAL_COMMUNITY): Payer: Medicare Other

## 2015-02-16 ENCOUNTER — Encounter (HOSPITAL_COMMUNITY): Payer: Self-pay

## 2015-02-16 ENCOUNTER — Emergency Department (HOSPITAL_COMMUNITY)
Admission: EM | Admit: 2015-02-16 | Discharge: 2015-02-16 | Disposition: A | Discharge status: Home / Self Care | Payer: Medicare Other | Attending: Emergency Medicine | Admitting: Emergency Medicine

## 2015-02-16 ENCOUNTER — Emergency Department (HOSPITAL_COMMUNITY): Payer: Medicare Other

## 2015-02-16 DIAGNOSIS — Z79899 Other long term (current) drug therapy: Secondary | ICD-10-CM | POA: Insufficient documentation

## 2015-02-16 DIAGNOSIS — J029 Acute pharyngitis, unspecified: Secondary | ICD-10-CM | POA: Diagnosis not present

## 2015-02-16 DIAGNOSIS — M791 Myalgia, unspecified site: Secondary | ICD-10-CM

## 2015-02-16 DIAGNOSIS — G8929 Other chronic pain: Secondary | ICD-10-CM | POA: Insufficient documentation

## 2015-02-16 DIAGNOSIS — R059 Cough, unspecified: Secondary | ICD-10-CM

## 2015-02-16 DIAGNOSIS — R110 Nausea: Secondary | ICD-10-CM

## 2015-02-16 DIAGNOSIS — R0602 Shortness of breath: Secondary | ICD-10-CM | POA: Diagnosis not present

## 2015-02-16 DIAGNOSIS — M545 Low back pain: Secondary | ICD-10-CM | POA: Insufficient documentation

## 2015-02-16 DIAGNOSIS — F329 Major depressive disorder, single episode, unspecified: Secondary | ICD-10-CM | POA: Diagnosis not present

## 2015-02-16 DIAGNOSIS — R05 Cough: Secondary | ICD-10-CM

## 2015-02-16 LAB — BASIC METABOLIC PANEL
Anion gap: 8 (ref 5–15)
BUN: 12 mg/dL (ref 6–23)
CHLORIDE: 102 mmol/L (ref 96–112)
CO2: 30 mmol/L (ref 19–32)
Calcium: 9.2 mg/dL (ref 8.4–10.5)
Creatinine, Ser: 1.14 mg/dL — ABNORMAL HIGH (ref 0.50–1.10)
GFR, EST AFRICAN AMERICAN: 61 mL/min — AB (ref >90)
GFR, EST NON AFRICAN AMERICAN: 52 mL/min — AB (ref >90)
GLUCOSE: 111 mg/dL — AB (ref 70–99)
POTASSIUM: 3.6 mmol/L (ref 3.5–5.1)
SODIUM: 140 mmol/L (ref 135–145)

## 2015-02-16 LAB — CBC WITH DIFFERENTIAL/PLATELET
BASOS ABS: 0 10*3/uL (ref 0.0–0.1)
Basophils Relative: 0 % (ref 0–1)
EOS PCT: 1 % (ref 0–5)
Eosinophils Absolute: 0.1 10*3/uL (ref 0.0–0.7)
HCT: 40.6 % (ref 36.0–46.0)
Hemoglobin: 13.1 g/dL (ref 12.0–15.0)
Lymphocytes Relative: 23 % (ref 12–46)
Lymphs Abs: 1.8 10*3/uL (ref 0.7–4.0)
MCH: 29 pg (ref 26.0–34.0)
MCHC: 32.3 g/dL (ref 30.0–36.0)
MCV: 89.8 fL (ref 78.0–100.0)
Monocytes Absolute: 0.2 10*3/uL (ref 0.1–1.0)
Monocytes Relative: 3 % (ref 3–12)
Neutro Abs: 5.7 10*3/uL (ref 1.7–7.7)
Neutrophils Relative %: 73 % (ref 43–77)
Platelets: 241 10*3/uL (ref 150–400)
RBC: 4.52 MIL/uL (ref 3.87–5.11)
RDW: 13.2 % (ref 11.5–15.5)
WBC: 7.9 10*3/uL (ref 4.0–10.5)

## 2015-02-16 MED ORDER — ONDANSETRON 8 MG PO TBDP
8.0000 mg | ORAL_TABLET | Freq: Once | ORAL | Status: AC
  Administered 2015-02-16: 8 mg via ORAL
  Filled 2015-02-16: qty 1

## 2015-02-16 MED ORDER — ONDANSETRON 8 MG PO TBDP
8.0000 mg | ORAL_TABLET | Freq: Three times a day (TID) | ORAL | Status: DC | PRN
Start: 2015-02-16 — End: 2015-04-18

## 2015-02-16 MED ORDER — ALBUTEROL SULFATE HFA 108 (90 BASE) MCG/ACT IN AERS
2.0000 | INHALATION_SPRAY | Freq: Once | RESPIRATORY_TRACT | Status: AC
  Administered 2015-02-16: 2 via RESPIRATORY_TRACT
  Filled 2015-02-16: qty 6.7

## 2015-02-16 MED ORDER — OXYCODONE-ACETAMINOPHEN 5-325 MG PO TABS
1.0000 | ORAL_TABLET | Freq: Once | ORAL | Status: AC
  Administered 2015-02-16: 1 via ORAL
  Filled 2015-02-16: qty 1

## 2015-02-16 NOTE — ED Provider Notes (Signed)
CSN: [id number]     Arrival date & time 02/16/15  2024 History  This chart was scribed for Sheryl Schmidt, MD by Eustaquio Maize, ED Scribe. This patient was seen in room APA08/APA08 and the patient's care was started at 9:30 PM.    Chief Complaint  Patient presents with  . Generalized Body Aches  . Cough   The history is provided by the patient. No language interpreter was used.     HPI Comments: Sheryl [name] is a 59 y.o. female who presents to the Emergency Department complaining of cough that began 2 weeks ago. She states that she feels phlegm running down her throat. She also complains of chills, nasal congestion, shortness of breath, nausea, stinging sensation in chest, and left lower back pain. Pt has been taking BC Powder without relief. She also tried home remedy of vinegar. Pt has been eating and drinking normally. Denies vomiting, diarrhea, or any other symptoms.     Past Medical History  Diagnosis Date  . Depression   . Chronic neck pain 2013  . Chronic back pain 2013   Past Surgical History  Procedure Laterality Date  . Breast cyst excision      right  . Tubal ligation     Family History  Problem Relation Age of Onset  . Hypertension Mother   . Depression Mother   . Hypertension Sister   . Hypertension Sister    History  Substance Use Topics  . Smoking status: Never Smoker   . Smokeless tobacco: Not on file  . Alcohol Use: No   OB History    No data available     Review of Systems  A complete 10 system review of systems was obtained and all systems are negative except as noted in the HPI and PMH.    Allergies  Review of patient's allergies indicates no known allergies.  Home Medications   Prior to Admission medications   Medication Sig Start Date End Date Taking? Authorizing Provider  escitalopram (LEXAPRO) 10 MG tablet Take 1 tablet (10 mg total) by mouth daily. 12/19/14  Yes Alycia Rossetti, MD  HYDROcodone-acetaminophen (NORCO) 5-325 MG per  tablet Take 1 tablet by mouth every 6 (six) hours as needed for moderate pain. 01/16/15  Yes Alycia Rossetti, MD  lisinopril-hydrochlorothiazide (PRINZIDE,ZESTORETIC) 10-12.5 MG per tablet Take 1 tablet by mouth daily. 02/08/15  Yes Alycia Rossetti, MD  meloxicam (MOBIC) 7.5 MG tablet Take 1 tablet (7.5 mg total) by mouth daily. Patient not taking: Reported on 02/16/2015 01/16/15   Alycia Rossetti, MD   Triage Vitals: BP 138/70 mmHg  Pulse 102  Temp(Src) 98.6 F (37 C) (Oral)  Resp 18  Ht 5' (1.524 m)  Wt 279 lb (126.554 kg)  BMI 54.49 kg/m2  SpO2 100%  LMP 12/20/2011   Physical Exam  Constitutional: She is oriented to person, place, and time. She appears well-developed and well-nourished. No distress.  HENT:  Head: Normocephalic and atraumatic.  Bilateral TM's normal.  Mild erythema to uvular midline. No exudate.   Eyes: EOM are normal.  Neck: Normal range of motion. No thyromegaly present.  Cardiovascular: Normal rate, regular rhythm and normal heart sounds.   Pulmonary/Chest: Effort normal and breath sounds normal.  Abdominal: Soft. She exhibits no distension. There is no tenderness.  Musculoskeletal: Normal range of motion.  Mild left para lumbar tenderness.   Lymphadenopathy:    She has no cervical adenopathy.  Neurological: She is alert and oriented to person,  place, and time.  Skin: Skin is warm and dry.  Psychiatric: She has a normal mood and affect. Judgment normal.  Nursing note and vitals reviewed.   ED Course  Procedures (including critical care time)  DIAGNOSTIC STUDIES: Oxygen Saturation is 100% on RA, normal by my interpretation.    COORDINATION OF CARE: 9:36 PM-Discussed treatment plan which includes CXR with pt at bedside and pt agreed to plan.   Labs Review Labs Reviewed  BASIC METABOLIC PANEL - Abnormal; Notable for the following:    Glucose, Bld 111 (*)    Creatinine, Ser 1.14 (*)    GFR calc non Af Amer 52 (*)    GFR calc Af Amer 61 (*)    All  other components within normal limits  CBC WITH DIFFERENTIAL/PLATELET    Imaging Review Dg Chest 2 View  02/16/2015   CLINICAL DATA:  Productive cough, congestion, shortness of breath, nausea, chills and body aches for 2 weeks. Initial encounter.  EXAM: CHEST  2 VIEW  COMPARISON:  Chest radiograph performed 07/21/2013  FINDINGS: The lungs are well-aerated. Mild vascular congestion is noted. There is no evidence of focal opacification, pleural effusion or pneumothorax.  The heart is normal in size; the mediastinal contour is within normal limits. No acute osseous abnormalities are seen. An apparent bone island is noted overlying the right midlung zone.  IMPRESSION: Mild vascular congestion noted; lungs remain grossly clear.   Electronically Signed   By: Garald Balding M.D.   On: 02/16/2015 22:30     EKG Interpretation   Date/Time:  Thursday February 16 2015 21:52:28 EDT Ventricular Rate:  76 PR Interval:  149 QRS Duration: 73 QT Interval:  377 QTC Calculation: 424 R Axis:   24 Text Interpretation:  Sinus rhythm Baseline wander in lead(s) V2 No  significant change was found Confirmed by Nonna Renninger  MD, Sofya Moustafa ([id number]) on  02/16/2015 10:53:35 PM      MDM   Final diagnoses:  Myalgia  Cough  Sorethroat  Nausea    Patient reports cough is significantly improved after albuterol MDI.  Patient's nausea is resolved.  She feels much better.  Her pain is improved.  Discharge home in good condition.  Primary care follow-up.  Suspect viral upper respiratory tract infection with associated bronchitis and bronchospasm.  Medications  albuterol (PROVENTIL HFA;VENTOLIN HFA) 108 (90 BASE) MCG/ACT inhaler 2 puff (2 puffs Inhalation Given 02/16/15 2157)  ondansetron (ZOFRAN-ODT) disintegrating tablet 8 mg (8 mg Oral Given 02/16/15 2146)  oxyCODONE-acetaminophen (PERCOCET/ROXICET) 5-325 MG per tablet 1 tablet (1 tablet Oral Given 02/16/15 2146)   I personally performed the services described in this  documentation, which was scribed in my presence. The recorded information has been reviewed and is accurate.       Sheryl Schmidt, MD 02/16/15 [phone]

## 2015-02-16 NOTE — ED Notes (Signed)
I have been having a cough, body aches, and feeling bad for two weeks. Sometimes I feel like I can't get the stuff in my chest coughed up.

## 2015-02-16 NOTE — ED Notes (Signed)
Phlebotomy at bedside.

## 2015-02-17 ENCOUNTER — Encounter (HOSPITAL_COMMUNITY): Payer: Medicare Other

## 2015-02-21 ENCOUNTER — Ambulatory Visit (INDEPENDENT_AMBULATORY_CARE_PROVIDER_SITE_OTHER): Payer: Medicare Other | Admitting: Orthopedic Surgery

## 2015-02-21 ENCOUNTER — Encounter (HOSPITAL_COMMUNITY): Payer: Medicare Other

## 2015-02-21 ENCOUNTER — Encounter: Payer: Self-pay | Admitting: Orthopedic Surgery

## 2015-02-21 VITALS — BP 126/76 | Ht 62.0 in | Wt 279.0 lb

## 2015-02-21 DIAGNOSIS — M75102 Unspecified rotator cuff tear or rupture of left shoulder, not specified as traumatic: Secondary | ICD-10-CM | POA: Diagnosis not present

## 2015-02-21 NOTE — Progress Notes (Signed)
Patient ID: Sheryl [name], female   DOB: 05/26/1956, 59 y.o.   MRN: [id number] Established patient follow-up shoulder pain improved  The patient was treated rotator cuff syndrome with physical therapy and injection. She came in is a 59 year old female who fell 3 times over 3 months prior to presentation and complained of painful forward elevation and catching and locking with previous treatment involving meloxicam without improvement  She reports no pain at this point she was released from therapy earlier she had significant improvement.  She exhibits full for elevation and normal rotator cuff strength  She is released with a maintenance program of home exercises with therapy and 2-3 times a week

## 2015-02-21 NOTE — Patient Instructions (Signed)
Do Home exercises 2 to 3 times a week.

## 2015-02-23 ENCOUNTER — Encounter (HOSPITAL_COMMUNITY): Payer: Medicare Other

## 2015-02-24 ENCOUNTER — Ambulatory Visit (INDEPENDENT_AMBULATORY_CARE_PROVIDER_SITE_OTHER): Payer: Medicare Other | Admitting: Family Medicine

## 2015-02-24 ENCOUNTER — Encounter: Payer: Self-pay | Admitting: Family Medicine

## 2015-02-24 VITALS — BP 128/70 | HR 96 | Temp 98.9°F | Resp 18 | Ht 62.0 in | Wt 279.0 lb

## 2015-02-24 DIAGNOSIS — J209 Acute bronchitis, unspecified: Secondary | ICD-10-CM

## 2015-02-24 MED ORDER — AZITHROMYCIN 250 MG PO TABS: ORAL_TABLET | ORAL | Status: DC

## 2015-02-24 MED ORDER — GUAIFENESIN-CODEINE 100-10 MG/5ML PO SOLN: 10.0000 mL | Freq: Four times a day (QID) | ORAL | Status: DC | PRN

## 2015-02-24 MED ORDER — PREDNISONE 20 MG PO TABS: 40.0000 mg | ORAL_TABLET | Freq: Every day | ORAL | Status: DC

## 2015-02-24 NOTE — Progress Notes (Signed)
Patient ID: Sheryl [name], female   DOB: 29-Apr-1956, 59 y.o.   MRN: [id number]   Subjective:    Patient ID: Sheryl [name], female    DOB: April 28, 1956, 59 y.o.   MRN: [id number]  Patient presents for ER F/U  patient follow-up ER visit. She was seen in the ER week ago secondary to cough and congestion she is still having progressive cough with production wheezing chest tightness. She uses the albuterol but does not have a lot of effect from this. She has not had any fever. She is not taking any other over-the-counter medicines. She is a nonsmoker.    Review Of Systems:  GEN- denies fatigue, fever, weight loss,weakness, recent illness HEENT- denies eye drainage, change in vision, nasal discharge, CVS- denies chest pain, palpitations RESP- + SOB,+ cough, +wheeze MSK- denies joint pain, muscle aches, injury Neuro- denies headache, dizziness, syncope, seizure activity       Objective:    BP 128/70 mmHg  Pulse 96  Temp(Src) 98.9 F (37.2 C) (Oral)  Resp 18  Ht 5\' 2"  (1.575 m)  Wt 279 lb (126.554 kg)  BMI 51.02 kg/m2  SpO2 98%  LMP 12/20/2011 GEN- NAD, alert and oriented x3 HEENT- PERRL, EOMI, non injected sclera, pink conjunctiva, MMM, oropharynxclear  TM clear bilat no effusion, no  maxillary sinus tenderness, + Nasal drainage  Neck- Supple, no LAD CVS- RRR, no murmur RESP-harsh cough, mild rhonchi, no wheeze, normal WOB EXT- No edema Pulses- Radial 2+         Assessment & Plan:      Problem List Items Addressed This Visit    None    Visit Diagnoses    Acute bronchitis, unspecified organism    -  Primary    worsening symptoms, treat for bronchitis, zpak, prednisone for wheeze, albuterol as needed, CXR no infiltrate 1 week ago       Note: This dictation was prepared with Dragon dictation along with smaller Company secretary. Any transcriptional errors that result from this process are unintentional.

## 2015-02-24 NOTE — Patient Instructions (Signed)
Take cough medicine as prescribed Antibiotics Prednisone for the chest tightness Use inhaler if wheezy or short of breath F/U as previous

## 2015-03-23 ENCOUNTER — Telehealth: Payer: Self-pay | Admitting: Family Medicine

## 2015-03-23 NOTE — Telephone Encounter (Signed)
Call placed to patient.   States that cough seemed to have resolved while she was taking prednisone, but then it returned when she completed therapy.   States that the Guaifenesin [facility] is not effective at all.   MD please advise.

## 2015-03-23 NOTE — Telephone Encounter (Signed)
Patient calling requesting stronger cough medicine, because she is having the cough [phone]

## 2015-03-24 MED ORDER — HYDROCOD POLST-CPM POLST ER 10-8 MG/5ML PO SUER: 5.0000 mL | Freq: Two times a day (BID) | ORAL | Status: DC

## 2015-03-24 NOTE — Telephone Encounter (Signed)
She can have tussionex  BID ( she has to pick this up), can not take with any other pain meds, with bronchitis, the cough can linger up to 6 weeks. Also use cough lozenges

## 2015-03-24 NOTE — Telephone Encounter (Signed)
Prescription printed and patient made aware to come to office to pick up after 2pm on 03/24/2015.

## 2015-04-18 ENCOUNTER — Encounter: Payer: Self-pay | Admitting: Family Medicine

## 2015-04-18 ENCOUNTER — Ambulatory Visit (INDEPENDENT_AMBULATORY_CARE_PROVIDER_SITE_OTHER): Payer: Medicare Other | Admitting: Family Medicine

## 2015-04-18 VITALS — BP 130/74 | HR 76 | Temp 98.3°F | Resp 16 | Ht 62.0 in | Wt 266.0 lb

## 2015-04-18 DIAGNOSIS — R7301 Impaired fasting glucose: Secondary | ICD-10-CM | POA: Diagnosis not present

## 2015-04-18 DIAGNOSIS — I10 Essential (primary) hypertension: Secondary | ICD-10-CM | POA: Diagnosis not present

## 2015-04-18 DIAGNOSIS — F331 Major depressive disorder, recurrent, moderate: Secondary | ICD-10-CM

## 2015-04-18 LAB — CBC WITH DIFFERENTIAL/PLATELET
Basophils Absolute: 0 10*3/uL (ref 0.0–0.1)
Basophils Relative: 0 % (ref 0–1)
EOS ABS: 0.1 10*3/uL (ref 0.0–0.7)
EOS PCT: 1 % (ref 0–5)
HCT: 39.2 % (ref 36.0–46.0)
HEMOGLOBIN: 13 g/dL (ref 12.0–15.0)
Lymphocytes Relative: 30 % (ref 12–46)
Lymphs Abs: 1.5 10*3/uL (ref 0.7–4.0)
MCH: 28.8 pg (ref 26.0–34.0)
MCHC: 33.2 g/dL (ref 30.0–36.0)
MCV: 86.9 fL (ref 78.0–100.0)
MPV: 9 fL (ref 8.6–12.4)
Monocytes Absolute: 0.2 10*3/uL (ref 0.1–1.0)
Monocytes Relative: 4 % (ref 3–12)
Neutro Abs: 3.3 10*3/uL (ref 1.7–7.7)
Neutrophils Relative %: 65 % (ref 43–77)
Platelets: 260 10*3/uL (ref 150–400)
RBC: 4.51 MIL/uL (ref 3.87–5.11)
RDW: 14.7 % (ref 11.5–15.5)
WBC: 5.1 10*3/uL (ref 4.0–10.5)

## 2015-04-18 LAB — LIPID PANEL
CHOL/HDL RATIO: 4 ratio
CHOLESTEROL: 174 mg/dL (ref 0–200)
HDL: 43 mg/dL — AB (ref >=46)
LDL Cholesterol: 117 mg/dL — ABNORMAL HIGH (ref 0–99)
TRIGLYCERIDES: 68 mg/dL (ref <150)
VLDL: 14 mg/dL (ref 0–40)

## 2015-04-18 LAB — COMPREHENSIVE METABOLIC PANEL
ALBUMIN: 3.7 g/dL (ref 3.5–5.2)
ALT: 15 U/L (ref 0–35)
AST: 18 U/L (ref 0–37)
Alkaline Phosphatase: 84 U/L (ref 39–117)
BUN: 9 mg/dL (ref 6–23)
CALCIUM: 8.9 mg/dL (ref 8.4–10.5)
CHLORIDE: 104 meq/L (ref 96–112)
CO2: 28 mEq/L (ref 19–32)
Creat: 1.12 mg/dL — ABNORMAL HIGH (ref 0.50–1.10)
GLUCOSE: 99 mg/dL (ref 70–99)
POTASSIUM: 4.3 meq/L (ref 3.5–5.3)
Sodium: 143 mEq/L (ref 135–145)
TOTAL PROTEIN: 6.5 g/dL (ref 6.0–8.3)
Total Bilirubin: 0.4 mg/dL (ref 0.2–1.2)

## 2015-04-18 LAB — TSH: TSH: 1.074 u[IU]/mL (ref 0.350–4.500)

## 2015-04-18 LAB — HEMOGLOBIN A1C
Hgb A1c MFr Bld: 6.2 % — ABNORMAL HIGH (ref <5.7)
Mean Plasma Glucose: 131 mg/dL — ABNORMAL HIGH (ref <117)

## 2015-04-18 MED ORDER — ESCITALOPRAM OXALATE 10 MG PO TABS: 10.0000 mg | ORAL_TABLET | Freq: Every day | ORAL | Status: DC

## 2015-04-18 MED ORDER — DIAZEPAM 5 MG PO TABS
ORAL_TABLET | ORAL | Status: DC
Start: 2015-04-18 — End: 2015-07-25

## 2015-04-18 MED ORDER — MELOXICAM 7.5 MG PO TABS: 7.5000 mg | ORAL_TABLET | Freq: Every day | ORAL | Status: DC

## 2015-04-18 MED ORDER — LISINOPRIL-HYDROCHLOROTHIAZIDE 10-12.5 MG PO TABS: 1.0000 | ORAL_TABLET | Freq: Every day | ORAL | Status: DC

## 2015-04-18 NOTE — Progress Notes (Signed)
Patient ID: Sheryl [name], female   DOB: 03-06-1956, 59 y.o.   MRN: [id number]   Subjective:    Patient ID: Sheryl [name], female    DOB: 03-27-1956, 59 y.o.   MRN: [id number]  Patient presents for 3 month F/U  patient here to follow-up chronic medical problems. Her weight is down 13 pounds over the past couple months. She states she is trying to walk some. She is taking her medicines as prescribed. She is due for fasting labs today. Her only concern is an upcoming flight to [place] she has never flown before it is highly anxious she had not note there was something she could use to calm her nerves From the previous bronchitis she still has some cough every now and then it is nonproductive. She denies any nasal drainage or postnasal drip she has not been using any cough medicine just cough drops as needed   Review Of Systems:  GEN- denies fatigue, fever, weight loss,weakness, recent illness HEENT- denies eye drainage, change in vision, nasal discharge, CVS- denies chest pain, palpitations RESP- denies SOB, +cough, wheeze ABD- denies N/V, change in stools, abd pain GU- denies dysuria, hematuria, dribbling, incontinence MSK- +joint pain, muscle aches, injury Neuro- denies headache, dizziness, syncope, seizure activity       Objective:    BP 130/74 mmHg  Pulse 76  Temp(Src) 98.3 F (36.8 C) (Oral)  Resp 16  Ht 5\' 2"  (1.575 m)  Wt 266 lb (120.657 kg)  BMI 48.64 kg/m2  LMP 12/20/2011 GEN- NAD, alert and oriented x3 HEENT- PERRL, EOMI, non injected sclera, pink conjunctiva, MMM, oropharynx clear Neck- Supple, no LAD CVS- RRR, no murmur RESP-CTAB Psych- normal affect and mood EXT- No edema Pulses- Radial, DP- 2+        Assessment & Plan:      Problem List Items Addressed This Visit    Morbid obesity - Primary    13 pounds down on her weight. Continue to encourage walking on a regular basis as well as healthy meal choices. She would benefit from about 50-60  pounds of weight loss      MDD (major depressive disorder)    Doing well on Lexapro continue current dose      Relevant Medications   escitalopram (LEXAPRO) 10 MG tablet   diazepam (VALIUM) 5 MG tablet   Essential hypertension, benign    Blood pressure well controlled. Of note she has fasting labs today. I did give her 4 tablets of Valium to take on her flight due to fear of flying. She also has some residual cough from her recent bronchitis she can just take Coricidin or Delsym      Relevant Medications   lisinopril-hydrochlorothiazide (PRINZIDE,ZESTORETIC) 10-12.5 MG per tablet   Other Relevant Orders   CBC with Differential/Platelet   Comprehensive metabolic panel   Lipid panel   TSH    Other Visit Diagnoses    Elevated fasting blood sugar        Relevant Orders    Hemoglobin A1c       Note: This dictation was prepared with Dragon dictation along with smaller phrase technology. Any transcriptional errors that result from this process are unintentional.

## 2015-04-18 NOTE — Assessment & Plan Note (Signed)
13 pounds down on her weight. Continue to encourage walking on a regular basis as well as healthy meal choices. She would benefit from about 50-60 pounds of weight loss

## 2015-04-18 NOTE — Patient Instructions (Signed)
Take the valium before the flight- 1 hour before  We will call with lab results Continue current medications Get Delsym for the cough  Or the Coricidan F/U 3 months -PHYSICAL

## 2015-04-18 NOTE — Assessment & Plan Note (Signed)
Doing well on Lexapro continue current dose

## 2015-04-18 NOTE — Assessment & Plan Note (Signed)
Blood pressure well controlled. Of note she has fasting labs today. I did give her 4 tablets of Valium to take on her flight due to fear of flying. She also has some residual cough from her recent bronchitis she can just take Coricidin or Delsym

## 2015-04-19 ENCOUNTER — Encounter: Payer: Self-pay | Admitting: *Deleted

## 2015-06-22 ENCOUNTER — Emergency Department (HOSPITAL_COMMUNITY)
Admission: EM | Admit: 2015-06-22 | Discharge: 2015-06-22 | Disposition: A | Discharge status: Home / Self Care | Payer: Medicare Other | Attending: Emergency Medicine | Admitting: Emergency Medicine

## 2015-06-22 ENCOUNTER — Encounter (HOSPITAL_COMMUNITY): Payer: Self-pay | Admitting: Emergency Medicine

## 2015-06-22 DIAGNOSIS — Z791 Long term (current) use of non-steroidal anti-inflammatories (NSAID): Secondary | ICD-10-CM | POA: Insufficient documentation

## 2015-06-22 DIAGNOSIS — J40 Bronchitis, not specified as acute or chronic: Secondary | ICD-10-CM | POA: Diagnosis not present

## 2015-06-22 DIAGNOSIS — G8929 Other chronic pain: Secondary | ICD-10-CM | POA: Insufficient documentation

## 2015-06-22 DIAGNOSIS — Y9389 Activity, other specified: Secondary | ICD-10-CM | POA: Diagnosis not present

## 2015-06-22 DIAGNOSIS — F329 Major depressive disorder, single episode, unspecified: Secondary | ICD-10-CM | POA: Diagnosis not present

## 2015-06-22 DIAGNOSIS — T65891A Toxic effect of other specified substances, accidental (unintentional), initial encounter: Secondary | ICD-10-CM | POA: Diagnosis not present

## 2015-06-22 DIAGNOSIS — T50991A Poisoning by other drugs, medicaments and biological substances, accidental (unintentional), initial encounter: Secondary | ICD-10-CM | POA: Diagnosis not present

## 2015-06-22 DIAGNOSIS — Y92009 Unspecified place in unspecified non-institutional (private) residence as the place of occurrence of the external cause: Secondary | ICD-10-CM | POA: Diagnosis not present

## 2015-06-22 DIAGNOSIS — Z79899 Other long term (current) drug therapy: Secondary | ICD-10-CM | POA: Diagnosis not present

## 2015-06-22 DIAGNOSIS — T5791XA Toxic effect of unspecified inorganic substance, accidental (unintentional), initial encounter: Secondary | ICD-10-CM

## 2015-06-22 DIAGNOSIS — Y998 Other external cause status: Secondary | ICD-10-CM | POA: Insufficient documentation

## 2015-06-22 MED ORDER — BENZONATATE 100 MG PO CAPS: 100.0000 mg | ORAL_CAPSULE | Freq: Three times a day (TID) | ORAL | Status: DC

## 2015-06-22 MED ORDER — ALBUTEROL SULFATE HFA 108 (90 BASE) MCG/ACT IN AERS: 2.0000 | INHALATION_SPRAY | RESPIRATORY_TRACT | Status: DC | PRN

## 2015-06-22 MED ORDER — ALBUTEROL SULFATE (2.5 MG/3ML) 0.083% IN NEBU
5.0000 mg | INHALATION_SOLUTION | Freq: Once | RESPIRATORY_TRACT | Status: AC
  Administered 2015-06-22: 5 mg via RESPIRATORY_TRACT
  Filled 2015-06-22: qty 6
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
## 2015-06-22 NOTE — ED Notes (Signed)
Patient verbalizes understanding of discharge instructions, prescription medications, home care and follow up care. Patient ambulatory out of department at this time. 

## 2015-06-22 NOTE — ED Notes (Signed)
Patient lying in bed, eyes closed. Even rise and fall of chest. NAD noted
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
## 2015-06-22 NOTE — ED Notes (Signed)
Pt ambulatory to and from restroom  

## 2015-06-22 NOTE — ED Provider Notes (Signed)
CSN: [id number]     Arrival date & time 06/22/15  0149 History   First MD Initiated Contact with Patient 06/22/15 0245   Chief Complaint  Patient presents with  . Chemical Exposure     (Consider location/radiation/quality/duration/timing/severity/associated sxs/prior Treatment) HPI patient reports she's had a cough for about 2 weeks with some white sputum production. No fever. She called her PCP when it and was advised to take Delsym over-the-counter. She states it helps a little bit. However tonight she was in bed and coughing a lot and her mother gave her what she thought was vinegar. Patient states she took a small sip about 1:30 AM and noted it did not taste like vinegar normally taste. When I looked at the bottle it was cleaning vinegar. She denies any burning in her throat. She denies difficulty swallowing. She denies any pain on swallowing or any soreness in her mouth. She states she feels like she's had some wheezing off and on the last couple weeks and has had wheezing and used an inhaler in the past.  PCP Dr Buelah Manis  Past Medical History  Diagnosis Date  . Depression   . Chronic neck pain 2013  . Chronic back pain 2013   Past Surgical History  Procedure Laterality Date  . Breast cyst excision      right  . Tubal ligation     Family History  Problem Relation Age of Onset  . Hypertension Mother   . Depression Mother   . Hypertension Sister   . Hypertension Sister    Social History  Substance Use Topics  . Smoking status: Never Smoker   . Smokeless tobacco: Never Used  . Alcohol Use: No   On disability for depression + second hand smoke  OB History    No data available     Review of Systems  All other systems reviewed and are negative.     Allergies  Review of patient's allergies indicates no known allergies.  Home Medications   Prior to Admission medications   Medication Sig Start Date End Date Taking? Authorizing Provider  diazepam (VALIUM) 5 MG  tablet Take 1 tablet 1 hour before flight as needed for anxiety 04/18/15  Yes Alycia Rossetti, MD  escitalopram (LEXAPRO) 10 MG tablet Take 1 tablet (10 mg total) by mouth daily. 04/18/15  Yes Alycia Rossetti, MD  lisinopril-hydrochlorothiazide (PRINZIDE,ZESTORETIC) 10-12.5 MG per tablet Take 1 tablet by mouth daily. 04/18/15  Yes Alycia Rossetti, MD  meloxicam (MOBIC) 7.5 MG tablet Take 1 tablet (7.5 mg total) by mouth daily. 04/18/15  Yes Alycia Rossetti, MD  albuterol (PROVENTIL HFA;VENTOLIN HFA) 108 (90 BASE) MCG/ACT inhaler Inhale 2 puffs into the lungs every 4 (four) hours as needed. 06/22/15   Rolland Porter, MD  benzonatate (TESSALON) 100 MG capsule Take 1 capsule (100 mg total) by mouth every 8 (eight) hours. 06/22/15   Rolland Porter, MD   BP 136/74 mmHg  Pulse 84  Temp(Src) 98.7 F (37.1 C) (Oral)  Resp 18  Ht 5\' 1"  (1.549 m)  Wt 265 lb (120.203 kg)  BMI 50.10 kg/m2  SpO2 100%  LMP 12/20/2011  Vital signs normal   Physical Exam  Constitutional: She is oriented to person, place, and time. She appears well-developed and well-nourished.  Non-toxic appearance. She does not appear ill. No distress.  HENT:  Head: Normocephalic and atraumatic.  Right Ear: External ear normal.  Left Ear: External ear normal.  Nose: Nose normal. No mucosal edema or  rhinorrhea.  Mouth/Throat: Oropharynx is clear and moist and mucous membranes are normal. No dental abscesses or uvula swelling.  Eyes: Conjunctivae and EOM are normal. Pupils are equal, round, and reactive to light.  Neck: Normal range of motion and full passive range of motion without pain. Neck supple.  Cardiovascular: Normal rate, regular rhythm and normal heart sounds.  Exam reveals no gallop and no friction rub.   No murmur heard. Pulmonary/Chest: Effort normal and breath sounds normal. No accessory muscle usage. No respiratory distress. She has no wheezes. She has no rhonchi. She has no rales. She exhibits no tenderness and no crepitus.  Breast  sounds are mildly diminished but I do not hear any wheezing. She is in no respiratory distress. I do not hear any coughing  Abdominal: Soft. Normal appearance and bowel sounds are normal. She exhibits no distension. There is no tenderness. There is no rebound and no guarding.  Musculoskeletal: Normal range of motion. She exhibits no edema or tenderness.  Moves all extremities well.   Neurological: She is alert and oriented to person, place, and time. She has normal strength. No cranial nerve deficit.  Skin: Skin is warm, dry and intact. No rash noted. No erythema. No pallor.  Psychiatric: She has a normal mood and affect. Her speech is normal and behavior is normal. Her mood appears not anxious.  Nursing note and vitals reviewed.   ED Course  Procedures (including critical care time)  Medications  albuterol (PROVENTIL) (2.5 MG/3ML) 0.083% nebulizer solution 5 mg (5 mg Nebulization Given 06/22/15 0344)    Poison control had been contacted. They state observed for a couple of hours for burns developing in her throat or vomiting. She was given a albuterol nebulizer to see if that will make her feel like her breathing is improved.  Patient was rechecked at 5:40 AM. She has been observed in the emergency department at least 3 hours. She denies having any discomfort in her mouth or throat. She is able to swallow normally. She states she felt like the nebulizer improved her breathing. Her lungs were rechecked and she did have some mild increased airflow. She still does not have any wheezing or rhonchi. There was no coughing noted again during my exam.       Labs Review Labs Reviewed - No data to display  Imaging Review No results found. I have personally reviewed and evaluated these images and lab results as part of my medical decision-making.   EKG Interpretation None      MDM   Final diagnoses:  Ingestion of substance, initial encounter  Bronchitis   New Prescriptions    ALBUTEROL (PROVENTIL HFA;VENTOLIN HFA) 108 (90 BASE) MCG/ACT INHALER    Inhale 2 puffs into the lungs every 4 (four) hours as needed.   BENZONATATE (TESSALON) 100 MG CAPSULE    Take 1 capsule (100 mg total) by mouth every 8 (eight) hours.    Plan discharge  Rolland Porter, MD, Barbette Or, MD 06/22/15 [phone]

## 2015-06-22 NOTE — ED Notes (Signed)
Patient able to tolerate PO fluids

## 2015-07-22 ENCOUNTER — Encounter (HOSPITAL_COMMUNITY): Payer: Self-pay | Admitting: *Deleted

## 2015-07-22 ENCOUNTER — Emergency Department (HOSPITAL_COMMUNITY): Payer: Medicare Other

## 2015-07-22 ENCOUNTER — Emergency Department (HOSPITAL_COMMUNITY)
Admission: EM | Admit: 2015-07-22 | Discharge: 2015-07-22 | Disposition: A | Discharge status: Home / Self Care | Payer: Medicare Other | Attending: Emergency Medicine | Admitting: Emergency Medicine

## 2015-07-22 DIAGNOSIS — G8929 Other chronic pain: Secondary | ICD-10-CM | POA: Insufficient documentation

## 2015-07-22 DIAGNOSIS — R05 Cough: Secondary | ICD-10-CM | POA: Diagnosis present

## 2015-07-22 DIAGNOSIS — Z791 Long term (current) use of non-steroidal anti-inflammatories (NSAID): Secondary | ICD-10-CM | POA: Insufficient documentation

## 2015-07-22 DIAGNOSIS — Z79899 Other long term (current) drug therapy: Secondary | ICD-10-CM | POA: Diagnosis not present

## 2015-07-22 DIAGNOSIS — F329 Major depressive disorder, single episode, unspecified: Secondary | ICD-10-CM | POA: Diagnosis not present

## 2015-07-22 DIAGNOSIS — B029 Zoster without complications: Secondary | ICD-10-CM | POA: Diagnosis not present

## 2015-07-22 DIAGNOSIS — R053 Chronic cough: Secondary | ICD-10-CM

## 2015-07-22 MED ORDER — CEPHALEXIN 500 MG PO CAPS: 500.0000 mg | ORAL_CAPSULE | Freq: Four times a day (QID) | ORAL | Status: DC

## 2015-07-22 MED ORDER — VALACYCLOVIR HCL 1 G PO TABS: 1000.0000 mg | ORAL_TABLET | Freq: Three times a day (TID) | ORAL | Status: DC

## 2015-07-22 MED ORDER — PROMETHAZINE-CODEINE 6.25-10 MG/5ML PO SYRP: 5.0000 mL | ORAL_SOLUTION | ORAL | Status: DC | PRN

## 2015-07-22 MED ORDER — PROMETHAZINE-CODEINE 6.25-10 MG/5ML PO SYRP
5.0000 mL | ORAL_SOLUTION | Freq: Once | ORAL | Status: AC
  Administered 2015-07-22: 5 mL via ORAL
  Filled 2015-07-22: qty 5

## 2015-07-22 NOTE — ED Notes (Signed)
Pt noticed blisters on her right stomach. Pt states she just noticed her blisters this morning. Blisters are closed and aren't draining. Pt also states she has had a cough for a month. NAD noted.

## 2015-07-22 NOTE — Discharge Instructions (Signed)
Cough, Adult  A cough is a reflex that helps clear your throat and airways. It can help heal the body or may be a reaction to an irritated airway. A cough may only last 2 or 3 weeks (acute) or may last more than 8 weeks (chronic).  CAUSES Acute cough:  Viral or bacterial infections. Chronic cough:  Infections.  Allergies.  Asthma.  Post-nasal drip.  Smoking.  Heartburn or acid reflux.  Some medicines.  Chronic lung problems (COPD).  Cancer. SYMPTOMS   Cough.  Fever.  Chest pain.  Increased breathing rate.  High-pitched whistling sound when breathing (wheezing).  Colored mucus that you cough up (sputum). TREATMENT   A bacterial cough may be treated with antibiotic medicine.  A viral cough must run its course and will not respond to antibiotics.  Your caregiver may recommend other treatments if you have a chronic cough. HOME CARE INSTRUCTIONS   Only take over-the-counter or prescription medicines for pain, discomfort, or fever as directed by your caregiver. Use cough suppressants only as directed by your caregiver.  Use a cold steam vaporizer or humidifier in your bedroom or home to help loosen secretions.  Sleep in a semi-upright position if your cough is worse at night.  Rest as needed.  Stop smoking if you smoke. SEEK IMMEDIATE MEDICAL CARE IF:   You have pus in your sputum.  Your cough starts to worsen.  You cannot control your cough with suppressants and are losing sleep.  You begin coughing up blood.  You have difficulty breathing.  You develop pain which is getting worse or is uncontrolled with medicine.  You have a fever. MAKE SURE YOU:   Understand these instructions.  Will watch your condition.  Will get help right away if you are not doing well or get worse. Document Released: 04/05/2011 Document Revised: 12/30/2011 Document Reviewed: 04/05/2011 [facility] Patient Information 2015 [place], [place]. This information is not intended  to replace advice given to you by your health care provider. Make sure you discuss any questions you have with your health care provider.  Shingles Shingles (herpes zoster) is an infection that is caused by the same virus that causes chickenpox (varicella). The infection causes a painful skin rash and fluid-filled blisters, which eventually break open, crust over, and heal. It may occur in any area of the body, but it usually affects only one side of the body or face. The pain of shingles usually lasts about 1 month. However, some people with shingles may develop long-term (chronic) pain in the affected area of the body. Shingles often occurs many years after the person had chickenpox. It is more common:  In people older than 50 years.  In people with weakened immune systems, such as those with HIV, AIDS, or cancer.  In people taking medicines that weaken the immune system, such as transplant medicines.  In people under great stress. CAUSES  Shingles is caused by the varicella zoster virus (VZV), which also causes chickenpox. After a person is infected with the virus, it can remain in the person's body for years in an inactive state (dormant). To cause shingles, the virus reactivates and breaks out as an infection in a nerve root. The virus can be spread from person to person (contagious) through contact with open blisters of the shingles rash. It will only spread to people who have not had chickenpox. When these people are exposed to the virus, they may develop chickenpox. They will not develop shingles. Once the blisters scab over,  the person is no longer contagious and cannot spread the virus to others. SIGNS AND SYMPTOMS  Shingles shows up in stages. The initial symptoms may be pain, itching, and tingling in an area of the skin. This pain is usually described as burning, stabbing, or throbbing.In a few days or weeks, a painful red rash will appear in the area where the pain, itching, and  tingling were felt. The rash is usually on one side of the body in a band or belt-like pattern. Then, the rash usually turns into fluid-filled blisters. They will scab over and dry up in approximately 2-3 weeks. Flu-like symptoms may also occur with the initial symptoms, the rash, or the blisters. These may include:  Fever.  Chills.  Headache.  Upset stomach. DIAGNOSIS  Your health care provider will perform a skin exam to diagnose shingles. Skin scrapings or fluid samples may also be taken from the blisters. This sample will be examined under a microscope or sent to a lab for further testing. TREATMENT  There is no specific cure for shingles. Your health care provider will likely prescribe medicines to help you manage the pain, recover faster, and avoid long-term problems. This may include antiviral drugs, anti-inflammatory drugs, and pain medicines. HOME CARE INSTRUCTIONS   Take a cool bath or apply cool compresses to the area of the rash or blisters as directed. This may help with the pain and itching.   Take medicines only as directed by your health care provider.   Rest as directed by your health care provider.  Keep your rash and blisters clean with mild soap and cool water or as directed by your health care provider.  Do not pick your blisters or scratch your rash. Apply an anti-itch cream or numbing creams to the affected area as directed by your health care provider.  Keep your shingles rash covered with a loose bandage (dressing).  Avoid skin contact with:  Babies.   Pregnant women.   Children with eczema.   Elderly people with transplants.   People with chronic illnesses, such as leukemia or AIDS.   Wear loose-fitting clothing to help ease the pain of material rubbing against the rash.  Keep all follow-up visits as directed by your health care provider.If the area involved is on your face, you may receive a referral for a specialist, such as an eye doctor  (ophthalmologist) or an ear, nose, and throat (ENT) doctor. Keeping all follow-up visits will help you avoid eye problems, chronic pain, or disability.  SEEK IMMEDIATE MEDICAL CARE IF:   You have facial pain, pain around the eye area, or loss of feeling on one side of your face.  You have ear pain or ringing in your ear.  You have loss of taste.  Your pain is not relieved with prescribed medicines.   Your redness or swelling spreads.   You have more pain and swelling.  Your condition is worsening or has changed.   You have a fever. MAKE SURE YOU:  Understand these instructions.  Will watch your condition.  Will get help right away if you are not doing well or get worse. Document Released: 10/07/2005 Document Revised: 02/21/2014 Document Reviewed: 05/21/2012 [facility] Patient Information 2015 [place], [place]. This information is not intended to replace advice given to you by your health care provider. Make sure you discuss any questions you have with your health care provider.  You are being treated for shingles as discussed,  take your entire course of the Valtrex prescribed.  You also are receiving an antibiotic in the event this rash is a bacterial source.  Use the cough syrup prescribed as needed, use caution as it will make you drowsy, do not drive within 4 hours of taking this medication.  Follow-up with your primary doctor for recheck this week if symptoms are not improving.  You should avoid contact with your grandchildren in tell your rash has scabbed over at which point you should no longer be contagious.

## 2015-07-22 NOTE — ED Provider Notes (Signed)
CSN: [id number]     Arrival date & time 07/22/15  0716 History   First MD Initiated Contact with Patient 07/22/15 [phone]     Chief Complaint  Patient presents with  . blisters   . Cough     (Consider location/radiation/quality/duration/timing/severity/associated sxs/prior Treatment) The history is provided by the patient.   Sheryl [name] is a 59 y.o. female with 2 complaints, the first being a chronic cough with occasional white sputum production going on for the past month.  She has used Delsym prior to her last visit here for this without relief.  She was accidentally treated for her cough with vinegar, later to find out it was "cleaning vinegar" at her last visit here on 9/1.  She reports her cough has not improved.  She denies fevers, chills, throat pain, sob and denies orthopnea, hemoptysis or chest pain or wheezing.  She has not seen her pcp for this complaint.  She denies smoking history.  She also reports developing a tender rash on her right abdomen which she first noticed this morning when she woke.  The area is tender, but also slightly itchy.  She denies any known chemical contacts and has found no alleviators.      Past Medical History  Diagnosis Date  . Depression   . Chronic neck pain 2013  . Chronic back pain 2013   Past Surgical History  Procedure Laterality Date  . Breast cyst excision      right  . Tubal ligation     Family History  Problem Relation Age of Onset  . Hypertension Mother   . Depression Mother   . Hypertension Sister   . Hypertension Sister    Social History  Substance Use Topics  . Smoking status: Never Smoker   . Smokeless tobacco: Never Used  . Alcohol Use: No   OB History    No data available     Review of Systems  Constitutional: Negative for fever.  HENT: Negative for congestion and sore throat.   Eyes: Negative.   Respiratory: Positive for cough. Negative for chest tightness, shortness of breath, wheezing and stridor.    Cardiovascular: Negative for chest pain.  Gastrointestinal: Negative for nausea and abdominal pain.  Genitourinary: Negative.   Musculoskeletal: Negative for joint swelling, arthralgias and neck pain.  Skin: Positive for rash. Negative for wound.  Neurological: Negative for dizziness, weakness, light-headedness, numbness and headaches.  Psychiatric/Behavioral: Negative.       Allergies  Review of patient's allergies indicates no known allergies.  Home Medications   Prior to Admission medications   Medication Sig Start Date End Date Taking? Authorizing Provider  albuterol (PROVENTIL HFA;VENTOLIN HFA) 108 (90 BASE) MCG/ACT inhaler Inhale 2 puffs into the lungs every 4 (four) hours as needed. 06/22/15  Yes Rolland Porter, MD  Aspirin-Salicylamide-Caffeine (BC HEADACHE PO) Take 1 packet by mouth daily.   Yes Historical Provider, MD  benzonatate (TESSALON) 100 MG capsule Take 1 capsule (100 mg total) by mouth every 8 (eight) hours. 06/22/15  Yes Rolland Porter, MD  dextromethorphan (DELSYM) 30 MG/5ML liquid Take 60 mg by mouth as needed for cough.   Yes Historical Provider, MD  diazepam (VALIUM) 5 MG tablet Take 1 tablet 1 hour before flight as needed for anxiety 04/18/15  Yes Alycia Rossetti, MD  escitalopram (LEXAPRO) 10 MG tablet Take 1 tablet (10 mg total) by mouth daily. 04/18/15  Yes Alycia Rossetti, MD  lisinopril-hydrochlorothiazide (PRINZIDE,ZESTORETIC) 10-12.5 MG per tablet Take 1 tablet  by mouth daily. 04/18/15  Yes Alycia Rossetti, MD  meloxicam (MOBIC) 7.5 MG tablet Take 1 tablet (7.5 mg total) by mouth daily. 04/18/15  Yes Alycia Rossetti, MD  cephALEXin (KEFLEX) 500 MG capsule Take 1 capsule (500 mg total) by mouth 4 (four) times daily. 07/22/15   Evalee Jefferson, PA-C  promethazine-codeine (PHENERGAN WITH CODEINE) 6.25-10 MG/5ML syrup Take 5 mLs by mouth every 4 (four) hours as needed for cough. 07/22/15   Evalee Jefferson, PA-C  valACYclovir (VALTREX) 1000 MG tablet Take 1 tablet (1,000 mg total) by  mouth 3 (three) times daily. 07/22/15   Evalee Jefferson, PA-C   BP 128/55 mmHg  Pulse 72  Temp(Src) 97.7 F (36.5 C) (Oral)  Resp 20  Ht 5' (1.524 m)  Wt 260 lb (117.935 kg)  BMI 50.78 kg/m2  SpO2 96%  LMP 12/20/2011 Physical Exam  Constitutional: She appears well-developed and well-nourished.  HENT:  Head: Normocephalic and atraumatic.  Eyes: Conjunctivae are normal.  Neck: Normal range of motion.  Cardiovascular: Normal rate, regular rhythm, normal heart sounds and intact distal pulses.   Pulmonary/Chest: Effort normal and breath sounds normal. No respiratory distress. She has no wheezes. She has no rales. She exhibits no tenderness.  Abdominal: Soft. Bowel sounds are normal. There is no tenderness.  Musculoskeletal: Normal range of motion.  Neurological: She is alert.  Skin: Skin is warm and dry.  Patchy of erythema right periumbilical area, approx 6 cm with scattered moderately sized clear filled intact vesicles.  No tracking of rash or redness around flank or on back.  Psychiatric: She has a normal mood and affect.  Nursing note and vitals reviewed.   ED Course  Procedures (including critical care time) Labs Review Labs Reviewed - No data to display  Imaging Review No results found. I have personally reviewed and evaluated these images and lab results as part of my medical decision-making.   EKG Interpretation None      MDM   Final diagnoses:  Chronic cough  Shingles rash    Patient was also seen by Dr Roderic Palau during this visit.  She was placed on Valtrex as her right abdominal rash may be shingles given its tender nature, although its distribution is not classic for shingles outbreak.  She has tenderness to palpation around the site with mild skin erythema without edema, so will also be covered for possible early cellulitis with Keflex.  She was prescribed Phenergan with codeine for her chronic cough.  Asked to follow-up with her PCP next week if symptoms persist or  not improving.  She was advised to avoid contact with her grand children until her rash has scabbed completely.  Also discussed avoiding any pregnant women or people with reduced immune system or on chemotherapy.     Evalee Jefferson, PA-C 07/24/15 2056  Milton Ferguson, MD 07/25/15 [phone]

## 2015-07-25 ENCOUNTER — Encounter: Payer: Self-pay | Admitting: Family Medicine

## 2015-07-25 ENCOUNTER — Ambulatory Visit (INDEPENDENT_AMBULATORY_CARE_PROVIDER_SITE_OTHER): Payer: Medicare Other | Admitting: Family Medicine

## 2015-07-25 VITALS — BP 154/94 | HR 76 | Temp 98.3°F | Resp 20 | Ht 60.0 in | Wt 265.0 lb

## 2015-07-25 DIAGNOSIS — Z1231 Encounter for screening mammogram for malignant neoplasm of breast: Secondary | ICD-10-CM | POA: Diagnosis not present

## 2015-07-25 DIAGNOSIS — N3946 Mixed incontinence: Secondary | ICD-10-CM | POA: Insufficient documentation

## 2015-07-25 DIAGNOSIS — R7302 Impaired glucose tolerance (oral): Secondary | ICD-10-CM

## 2015-07-25 DIAGNOSIS — I10 Essential (primary) hypertension: Secondary | ICD-10-CM | POA: Diagnosis not present

## 2015-07-25 DIAGNOSIS — L0103 Bullous impetigo: Secondary | ICD-10-CM | POA: Diagnosis not present

## 2015-07-25 DIAGNOSIS — Z0000 Encounter for general adult medical examination without abnormal findings: Secondary | ICD-10-CM

## 2015-07-25 DIAGNOSIS — B372 Candidiasis of skin and nail: Secondary | ICD-10-CM | POA: Diagnosis not present

## 2015-07-25 DIAGNOSIS — N3281 Overactive bladder: Secondary | ICD-10-CM

## 2015-07-25 DIAGNOSIS — N393 Stress incontinence (female) (male): Secondary | ICD-10-CM

## 2015-07-25 LAB — CBC WITH DIFFERENTIAL/PLATELET
BASOS ABS: 0 10*3/uL (ref 0.0–0.1)
Basophils Relative: 0 % (ref 0–1)
Eosinophils Absolute: 0.1 10*3/uL (ref 0.0–0.7)
Eosinophils Relative: 1 % (ref 0–5)
HEMATOCRIT: 40.2 % (ref 36.0–46.0)
HEMOGLOBIN: 13.4 g/dL (ref 12.0–15.0)
LYMPHS ABS: 1.5 10*3/uL (ref 0.7–4.0)
LYMPHS PCT: 23 % (ref 12–46)
MCH: 28.2 pg (ref 26.0–34.0)
MCHC: 33.3 g/dL (ref 30.0–36.0)
MCV: 84.5 fL (ref 78.0–100.0)
MPV: 9.6 fL (ref 8.6–12.4)
Monocytes Absolute: 0.3 10*3/uL (ref 0.1–1.0)
Monocytes Relative: 5 % (ref 3–12)
NEUTROS ABS: 4.8 10*3/uL (ref 1.7–7.7)
Neutrophils Relative %: 71 % (ref 43–77)
Platelets: 268 10*3/uL (ref 150–400)
RBC: 4.76 MIL/uL (ref 3.87–5.11)
RDW: 13.8 % (ref 11.5–15.5)
WBC: 6.7 10*3/uL (ref 4.0–10.5)

## 2015-07-25 LAB — WET PREP FOR TRICH, YEAST, CLUE
Clue Cells Wet Prep HPF POC: NONE SEEN
Trich, Wet Prep: NONE SEEN
YEAST WET PREP: NONE SEEN

## 2015-07-25 LAB — COMPREHENSIVE METABOLIC PANEL
ALBUMIN: 3.8 g/dL (ref 3.6–5.1)
ALK PHOS: 93 U/L (ref 33–130)
ALT: 18 U/L (ref 6–29)
AST: 20 U/L (ref 10–35)
BILIRUBIN TOTAL: 0.3 mg/dL (ref 0.2–1.2)
BUN: 11 mg/dL (ref 7–25)
CO2: 27 mmol/L (ref 20–31)
Calcium: 9.3 mg/dL (ref 8.6–10.4)
Chloride: 106 mmol/L (ref 98–110)
Creat: 1.09 mg/dL — ABNORMAL HIGH (ref 0.50–1.05)
GLUCOSE: 81 mg/dL (ref 70–99)
Potassium: 4.3 mmol/L (ref 3.5–5.3)
Sodium: 141 mmol/L (ref 135–146)
Total Protein: 6.8 g/dL (ref 6.1–8.1)

## 2015-07-25 MED ORDER — NYSTATIN 100000 UNIT/GM EX POWD
CUTANEOUS | Status: DC
Start: 2015-07-25 — End: 2015-11-27

## 2015-07-25 MED ORDER — MIRABEGRON ER 25 MG PO TB24: 25.0000 mg | ORAL_TABLET | Freq: Every day | ORAL | Status: DC

## 2015-07-25 MED ORDER — LOSARTAN POTASSIUM-HCTZ 50-12.5 MG PO TABS: 1.0000 | ORAL_TABLET | Freq: Every day | ORAL | Status: DC

## 2015-07-25 NOTE — Patient Instructions (Addendum)
Stop the lisinopril HCTZ Start LOSARTAN HCTZ- this should not make you cough Try the Myrbetiq- 30 day free supply for your bladder Take your lexapro every day  Schedule your mammogram Powder for skin We will call with lab results F/U 3 weeks for recheck on blood pressure and rash

## 2015-07-25 NOTE — Progress Notes (Signed)
Subjective:   Patient presents for Medicare Annual/Subsequent preventive examination.  Patient here for wellness exam. She is due for Pap smear mammogram. She was seen in the emergency room recently secondary to a blistering rash on her abdomen there was concern it may have been shingles but did not fit the typical characteristics therefore she was also given Keflex along with Valtrex to cover for bacterial infection. She's not had any change in the blistering lesions but no new ones. She's not had any fever no significant abdominal pain.  She's had a chronic cough which she's also been seen in the emergency room twice for chest x-ray was negative. There is more of a dry hacking cough. She is on lisinopril and this is been going on for the past couple months.  Note she is not taking her antidepression on a regular basis.  Having problems with OAB for some time, would like to try medications Review Past Medical/Family/Social: Per EMR   Risk Factors  Current exercise habits: None Dietary issues discussed: Yes  Cardiac risk factors: Obesity (BMI >= 30 kg/m2). HTN  Depression Screen  - TREATING FOR DEPRESSION (Note: if answer to either of the following is "Yes", a more complete depression screening is indicated)  Over the past two weeks, have you felt down, depressed or hopeless? YES Over the past two weeks, have you felt little interest or pleasure in doing things? No Have you lost interest or pleasure in daily life? No Do you often feel hopeless? No Do you cry easily over simple problems? No   Activities of Daily Living  In your present state of health, do you have any difficulty performing the following activities?:  Driving? No  Managing money? No  Feeding yourself? No  Getting from bed to chair? No  Climbing a flight of stairs? No  Preparing food and eating?: No  Bathing or showering? No  Getting dressed: No  Getting to the toilet? No  Using the toilet:No  Moving around from  place to place: No  In the past year have you fallen or had a near fall?:No  Are you sexually active? No  Do you have more than one partner? No   Hearing Difficulties: No  Do you often ask people to speak up or repeat themselves? No  Do you experience ringing or noises in your ears? No Do you have difficulty understanding soft or whispered voices? No  Do you feel that you have a problem with memory? No Do you often misplace items? No  Do you feel safe at home? Yes  Cognitive Testing  Alert? Yes Normal Appearance?Yes  Oriented to person? Yes Place? Yes  Time? Yes  Recall of three objects? Yes  Can perform simple calculations? Yes  Displays appropriate judgment?Yes  Can read the correct time from a watch face?Yes   Screening Tests / Date Colonoscopy    - Per pt she thinks she had already                  Zostavax - age  59 Mammogram -Due  Influenza Vaccine - Declines  Tetanus/tdap- unable to afford  ROS:  GEN- denies fatigue, fever, weight loss,weakness, recent illness HEENT- denies eye drainage, change in vision, nasal discharge, CVS- denies chest pain, palpitations RESP- denies SOB, cough, wheeze ABD- denies N/V, change in stools, abd pain GU- denies dysuria, hematuria, dribbling, incontinence MSK- denies joint pain, muscle aches, injury Neuro- denies headache, dizziness, syncope, seizure activity   PHYSICAL GEN- NAD, alert  and oriented x3 HEENT- PERRL, EOMI, non injected sclera, pink conjunctiva, MMM, oropharynx clear Neck- Supple, no thryomegaly CVS- RRR, no murmur RESP-CTAB Breast- normal symmetry, no nipple inversion,no nipple drainage, no nodules or lumps felt Nodes- no axillary nodes ABD-NABS,soft,NT,ND Skin-  5 flesh toned bullae to right of umilicus mild erythema surrounding, no ulceration, maceration in bilat inguinal creases  GU- normal external genitalia, vaginal mucosa with atrophy, lichen sclerosis, cervix visualized no growth, no blood form os,+  discharge, no CMT, no ovarian masses, uterus normal size, mild bladder prolapse ,  EXT- No edema Pulses- Radial, DP- 2+     Assessment:    Annual wellness medicare exam   Plan:    During the course of the visit the patient was educated and counseled about appropriate screening and preventive services including:  Screening mammography  Colorectal cancer screening - pt states she had will try to find results of this     Declines flu shot   - Nystatin for intertrigo Patient Instructions (the written plan) was given to the patient.  Medicare Attestation  I have personally reviewed:  The patient's medical and social history  Their use of alcohol, tobacco or illicit drugs  Their current medications and supplements  The patient's functional ability including ADLs,fall risks, home safety risks, cognitive, and hearing and visual impairment  Diet and physical activities  Evidence for depression or mood disorders  The patient's weight, height, BMI, and visual acuity have been recorded in the chart. I have made referrals, counseling, and provided education to the patient based on review of the above and I have provided the patient with a written personalized care plan for preventive services.

## 2015-07-26 ENCOUNTER — Encounter: Payer: Self-pay | Admitting: Family Medicine

## 2015-07-26 DIAGNOSIS — L0103 Bullous impetigo: Secondary | ICD-10-CM | POA: Insufficient documentation

## 2015-07-26 LAB — PAP, THIN PREP, IMAGING, MEDICARE

## 2015-07-26 LAB — HEMOGLOBIN A1C
HEMOGLOBIN A1C: 6.5 % — AB (ref <5.7)
Mean Plasma Glucose: 140 mg/dL — ABNORMAL HIGH (ref <117)

## 2015-07-26 NOTE — Assessment & Plan Note (Signed)
Trial of Mirabetriq

## 2015-07-26 NOTE — Assessment & Plan Note (Addendum)
Well controlled, however concerned that she may have ACE inhibitor cough. I will stop the lisinopril and try her on losartan

## 2015-07-26 NOTE — Assessment & Plan Note (Signed)
Based on exam I do not think this is shingles I think this is more of a bullous impetigo recommended that she complete the Keflex

## 2015-07-27 ENCOUNTER — Encounter: Payer: Self-pay | Admitting: *Deleted

## 2015-08-04 ENCOUNTER — Ambulatory Visit (HOSPITAL_COMMUNITY): Payer: Medicare Other

## 2015-08-11 ENCOUNTER — Telehealth: Payer: Self-pay | Admitting: Family Medicine

## 2015-08-11 MED ORDER — PROMETHAZINE-CODEINE 6.25-10 MG/5ML PO SYRP: 5.0000 mL | ORAL_SOLUTION | ORAL | Status: DC | PRN

## 2015-08-11 NOTE — Telephone Encounter (Signed)
PHARMACY: SELF PICK UP  MEDICATION: COUGH SYRUP WITH CODENE   QTY:   SIG:    PHYSICIAN: Chandler   PT. PHONE #: [phone]

## 2015-08-11 NOTE — Telephone Encounter (Signed)
Medication called to pharmacy. 

## 2015-08-11 NOTE — Telephone Encounter (Signed)
Ok to refill promethazine with codeine? Patient has dx of chronic cough.

## 2015-08-11 NOTE — Telephone Encounter (Signed)
Okay to refill? 

## 2015-08-16 ENCOUNTER — Ambulatory Visit: Payer: Medicare Other | Admitting: Family Medicine

## 2015-08-22 ENCOUNTER — Other Ambulatory Visit: Payer: Self-pay | Admitting: Family Medicine

## 2015-08-22 DIAGNOSIS — Z1231 Encounter for screening mammogram for malignant neoplasm of breast: Secondary | ICD-10-CM

## 2015-08-30 ENCOUNTER — Ambulatory Visit (HOSPITAL_COMMUNITY)
Admission: RE | Admit: 2015-08-30 | Discharge: 2015-08-30 | Disposition: A | Discharge status: Home / Self Care | Payer: Medicare Other | Source: Ambulatory Visit | Attending: Family Medicine | Admitting: Family Medicine

## 2015-08-30 DIAGNOSIS — Z1231 Encounter for screening mammogram for malignant neoplasm of breast: Secondary | ICD-10-CM | POA: Insufficient documentation

## 2015-11-20 ENCOUNTER — Emergency Department (HOSPITAL_COMMUNITY)
Admission: EM | Admit: 2015-11-20 | Discharge: 2015-11-21 | Disposition: A | Discharge status: Home / Self Care | Payer: Medicare Other | Attending: Emergency Medicine | Admitting: Emergency Medicine

## 2015-11-20 ENCOUNTER — Emergency Department (HOSPITAL_COMMUNITY): Payer: Medicare Other

## 2015-11-20 ENCOUNTER — Encounter (HOSPITAL_COMMUNITY): Payer: Self-pay | Admitting: *Deleted

## 2015-11-20 DIAGNOSIS — G8929 Other chronic pain: Secondary | ICD-10-CM | POA: Diagnosis not present

## 2015-11-20 DIAGNOSIS — M791 Myalgia: Secondary | ICD-10-CM | POA: Insufficient documentation

## 2015-11-20 DIAGNOSIS — F329 Major depressive disorder, single episode, unspecified: Secondary | ICD-10-CM | POA: Insufficient documentation

## 2015-11-20 DIAGNOSIS — J209 Acute bronchitis, unspecified: Secondary | ICD-10-CM | POA: Insufficient documentation

## 2015-11-20 DIAGNOSIS — R05 Cough: Secondary | ICD-10-CM | POA: Diagnosis not present

## 2015-11-20 DIAGNOSIS — J40 Bronchitis, not specified as acute or chronic: Secondary | ICD-10-CM

## 2015-11-20 DIAGNOSIS — R000 Tachycardia, unspecified: Secondary | ICD-10-CM | POA: Diagnosis not present

## 2015-11-20 DIAGNOSIS — Z79899 Other long term (current) drug therapy: Secondary | ICD-10-CM | POA: Diagnosis not present

## 2015-11-20 DIAGNOSIS — R52 Pain, unspecified: Secondary | ICD-10-CM | POA: Diagnosis present

## 2015-11-20 DIAGNOSIS — J069 Acute upper respiratory infection, unspecified: Secondary | ICD-10-CM

## 2015-11-20 LAB — CBC WITH DIFFERENTIAL/PLATELET
Basophils Absolute: 0 10*3/uL (ref 0.0–0.1)
Basophils Relative: 0 %
EOS ABS: 0 10*3/uL (ref 0.0–0.7)
EOS PCT: 0 %
HCT: 42.5 % (ref 36.0–46.0)
Hemoglobin: 13.8 g/dL (ref 12.0–15.0)
LYMPHS ABS: 0.6 10*3/uL — AB (ref 0.7–4.0)
LYMPHS PCT: 8 %
MCH: 28.7 pg (ref 26.0–34.0)
MCHC: 32.5 g/dL (ref 30.0–36.0)
MCV: 88.4 fL (ref 78.0–100.0)
MONO ABS: 0.5 10*3/uL (ref 0.1–1.0)
MONOS PCT: 6 %
Neutro Abs: 6.7 10*3/uL (ref 1.7–7.7)
Neutrophils Relative %: 86 %
PLATELETS: 181 10*3/uL (ref 150–400)
RBC: 4.81 MIL/uL (ref 3.87–5.11)
RDW: 13.3 % (ref 11.5–15.5)
WBC: 7.8 10*3/uL (ref 4.0–10.5)

## 2015-11-20 LAB — BASIC METABOLIC PANEL
Anion gap: 9 (ref 5–15)
BUN: 9 mg/dL (ref 6–20)
CALCIUM: 9 mg/dL (ref 8.9–10.3)
CO2: 26 mmol/L (ref 22–32)
CREATININE: 1.21 mg/dL — AB (ref 0.44–1.00)
Chloride: 101 mmol/L (ref 101–111)
GFR calc Af Amer: 56 mL/min — ABNORMAL LOW (ref >60)
GFR, EST NON AFRICAN AMERICAN: 49 mL/min — AB (ref >60)
GLUCOSE: 140 mg/dL — AB (ref 65–99)
POTASSIUM: 3.8 mmol/L (ref 3.5–5.1)
SODIUM: 136 mmol/L (ref 135–145)

## 2015-11-20 LAB — LACTIC ACID, PLASMA: Lactic Acid, Venous: 1 mmol/L (ref 0.5–2.0)

## 2015-11-20 MED ORDER — SODIUM CHLORIDE 0.9 % IV BOLUS (SEPSIS)
1000.0000 mL | Freq: Once | INTRAVENOUS | Status: AC
  Administered 2015-11-20: 1000 mL via INTRAVENOUS

## 2015-11-20 MED ORDER — PROCHLORPERAZINE EDISYLATE 5 MG/ML IJ SOLN
5.0000 mg | Freq: Once | INTRAMUSCULAR | Status: AC
  Administered 2015-11-20: 5 mg via INTRAVENOUS
  Filled 2015-11-20: qty 2

## 2015-11-20 MED ORDER — OXYMETAZOLINE HCL 0.05 % NA SOLN
1.0000 | Freq: Once | NASAL | Status: AC
  Administered 2015-11-20: 1 via NASAL
  Filled 2015-11-20: qty 15

## 2015-11-20 MED ORDER — ACETAMINOPHEN 500 MG PO TABS
1000.0000 mg | ORAL_TABLET | Freq: Once | ORAL | Status: AC
  Administered 2015-11-20: 1000 mg via ORAL
  Filled 2015-11-20: qty 2

## 2015-11-20 MED ORDER — METHYLPREDNISOLONE SODIUM SUCC 125 MG IJ SOLR
125.0000 mg | Freq: Once | INTRAMUSCULAR | Status: AC
  Administered 2015-11-20: 125 mg via INTRAVENOUS
  Filled 2015-11-20: qty 2

## 2015-11-20 MED ORDER — MORPHINE SULFATE (PF) 4 MG/ML IV SOLN
4.0000 mg | Freq: Once | INTRAVENOUS | Status: AC
  Administered 2015-11-20: 4 mg via INTRAVENOUS
  Filled 2015-11-20: qty 1

## 2015-11-20 NOTE — ED Notes (Signed)
Pt states her granddaughter was recently diagnosed with the flu and pt states now she is having the same symptoms x 3 days

## 2015-11-21 MED ORDER — AZITHROMYCIN 250 MG PO TABS: ORAL_TABLET | ORAL | Status: DC

## 2015-11-21 MED ORDER — DEXAMETHASONE 4 MG PO TABS: 4.0000 mg | ORAL_TABLET | Freq: Two times a day (BID) | ORAL | Status: DC

## 2015-11-21 MED ORDER — AMOXICILLIN 250 MG PO CAPS
500.0000 mg | ORAL_CAPSULE | Freq: Once | ORAL | Status: AC
  Administered 2015-11-21: 500 mg via ORAL
  Filled 2015-11-21: qty 2

## 2015-11-21 MED ORDER — AZITHROMYCIN 250 MG PO TABS
500.0000 mg | ORAL_TABLET | Freq: Once | ORAL | Status: AC
  Administered 2015-11-21: 500 mg via ORAL
  Filled 2015-11-21: qty 2

## 2015-11-21 MED ORDER — ALBUTEROL SULFATE HFA 108 (90 BASE) MCG/ACT IN AERS
2.0000 | INHALATION_SPRAY | Freq: Once | RESPIRATORY_TRACT | Status: AC
  Administered 2015-11-21: 2 via RESPIRATORY_TRACT
  Filled 2015-11-21: qty 6.7

## 2015-11-21 NOTE — Discharge Instructions (Signed)
Your exam suggest URI aggravating your asthma. Please discuss symptoms and possible sleep apnea with Dr Buelah Manis. Your kidney function is elevated compared to previous visits.  Use albuterol every 4 hours. Use zithromax daily with food, and use decadron two time daily. Please increase fluids, and wash hands frequently. Upper Respiratory Infection, Adult Most upper respiratory infections (URIs) are caused by a virus. A URI affects the nose, throat, and upper air passages. The most common type of URI is often called "the common cold." HOME CARE   Take medicines only as told by your doctor.  Gargle warm saltwater or take cough drops to comfort your throat as told by your doctor.  Use a warm mist humidifier or inhale steam from a shower to increase air moisture. This may make it easier to breathe.  Drink enough fluid to keep your pee (urine) clear or pale yellow.  Eat soups and other clear broths.  Have a healthy diet.  Rest as needed.  Go back to work when your fever is gone or your doctor says it is okay.  You may need to stay home longer to avoid giving your URI to others.  You can also wear a face mask and wash your hands often to prevent spread of the virus.  Use your inhaler more if you have asthma.  Do not use any tobacco products, including cigarettes, chewing tobacco, or electronic cigarettes. If you need help quitting, ask your doctor. GET HELP IF:  You are getting worse, not better.  Your symptoms are not helped by medicine.  You have chills.  You are getting more short of breath.  You have brown or red mucus.  You have yellow or brown discharge from your nose.  You have pain in your face, especially when you bend forward.  You have a fever.  You have puffy (swollen) neck glands.  You have pain while swallowing.  You have white areas in the back of your throat. GET HELP RIGHT AWAY IF:   You have very bad or constant:  Headache.  Ear pain.  Pain in your  forehead, behind your eyes, and over your cheekbones (sinus pain).  Chest pain.  You have long-lasting (chronic) lung disease and any of the following:  Wheezing.  Long-lasting cough.  Coughing up blood.  A change in your usual mucus.  You have a stiff neck.  You have changes in your:  Vision.  Hearing.  Thinking.  Mood. MAKE SURE YOU:   Understand these instructions.  Will watch your condition.  Will get help right away if you are not doing well or get worse.   This information is not intended to replace advice given to you by your health care provider. Make sure you discuss any questions you have with your health care provider.   Document Released: 03/25/2008 Document Revised: 02/21/2015 Document Reviewed: 01/12/2014 Elsevier Interactive Patient Education [employer].

## 2015-11-21 NOTE — ED Provider Notes (Signed)
CSN: [id number]     Arrival date & time 11/20/15  1939 History   First MD Initiated Contact with Patient 11/20/15 2152     Chief Complaint  Patient presents with  . Generalized Body Aches     (Consider location/radiation/quality/duration/timing/severity/associated sxs/prior Treatment) Patient is a 60 y.o. female presenting with URI. The history is provided by the patient and a relative.  URI Presenting symptoms: congestion, cough, fever, rhinorrhea and sore throat   Severity:  Moderate Onset quality:  Gradual Duration:  3 days Timing:  Intermittent Progression:  Worsening Chronicity:  New Relieved by:  Nothing Ineffective treatments:  None tried Associated symptoms: headaches, myalgias, sneezing and wheezing   Risk factors: sick contacts   Risk factors: no immunosuppression and no recent travel   Risk factors comment:  Hx of asthma   Past Medical History  Diagnosis Date  . Depression   . Chronic neck pain 2013  . Chronic back pain 2013  . Hypertension    Past Surgical History  Procedure Laterality Date  . Breast cyst excision      right  . Tubal ligation     Family History  Problem Relation Age of Onset  . Hypertension Mother   . Depression Mother   . Hypertension Sister   . Hypertension Sister    Social History  Substance Use Topics  . Smoking status: Never Smoker   . Smokeless tobacco: Never Used  . Alcohol Use: No   OB History    No data available     Review of Systems  Constitutional: Positive for fever, chills and appetite change.  HENT: Positive for congestion, rhinorrhea, sneezing and sore throat.   Respiratory: Positive for cough and wheezing.   Musculoskeletal: Positive for myalgias.  Neurological: Positive for headaches.  All other systems reviewed and are negative.     Allergies  Review of patient's allergies indicates no known allergies.  Home Medications   Prior to Admission medications   Medication Sig Start Date End Date Taking?  Authorizing Provider  albuterol (PROVENTIL HFA;VENTOLIN HFA) 108 (90 BASE) MCG/ACT inhaler Inhale 2 puffs into the lungs every 4 (four) hours as needed. 06/22/15  Yes Rolland Porter, MD  Aspirin-Salicylamide-Caffeine (BC HEADACHE PO) Take 1 packet by mouth daily as needed (for pain).    Yes Historical Provider, MD  escitalopram (LEXAPRO) 10 MG tablet Take 1 tablet (10 mg total) by mouth daily. Patient taking differently: Take 10 mg by mouth daily as needed (for nervousness).  04/18/15  Yes Alycia Rossetti, MD  losartan-hydrochlorothiazide (HYZAAR) 50-12.5 MG tablet Take 1 tablet by mouth daily. 07/25/15  Yes Alycia Rossetti, MD  nystatin (MYCOSTATIN/NYSTOP) 100000 UNIT/GM POWD Apply three times a day as needed to groin and breast Patient taking differently: Apply topically daily as needed (as needed to groin and breast).  07/25/15  Yes Alycia Rossetti, MD  cephALEXin (KEFLEX) 500 MG capsule Take 1 capsule (500 mg total) by mouth 4 (four) times daily. Patient not taking: Reported on 11/20/2015 07/22/15   Evalee Jefferson, PA-C   BP 148/63 mmHg  Pulse 104  Temp(Src) 102.1 F (38.9 C) (Oral)  Resp 19  Ht 5\' 2"  (1.575 m)  Wt 117.935 kg  BMI 47.54 kg/m2  SpO2 97%  LMP 12/20/2011 Physical Exam  Constitutional: She is oriented to person, place, and time. She appears well-developed and well-nourished.  Non-toxic appearance.  HENT:  Head: Normocephalic.  Right Ear: Tympanic membrane and external ear normal.  Left Ear: Tympanic membrane and  external ear normal.  Nasal congestion. Airway patent. Uvula midline Pt has snoring respirations with supine, this stops when sitting up.  Eyes: EOM and lids are normal. Pupils are equal, round, and reactive to light.  Neck: Normal range of motion. Neck supple. Carotid bruit is not present.  Cardiovascular: Regular rhythm, normal heart sounds, intact distal pulses and normal pulses.  Tachycardia present.   Pulmonary/Chest: Breath sounds normal. No respiratory distress. She  has no rhonchi.  Course breath sounds. Symmetrical rise and fall of lthe chest. Pt speaks in complete sentences.  Abdominal: Soft. Bowel sounds are normal. There is no tenderness. There is no guarding.  Musculoskeletal: Normal range of motion.  Lymphadenopathy:       Head (right side): No submandibular adenopathy present.       Head (left side): No submandibular adenopathy present.    She has cervical adenopathy.  Neurological: She is alert and oriented to person, place, and time. She has normal strength. No cranial nerve deficit or sensory deficit.  Skin: Skin is warm and dry.  Psychiatric: She has a normal mood and affect. Her speech is normal.  Nursing note and vitals reviewed.   ED Course  Procedures (including critical care time) Labs Review Labs Reviewed  CBC WITH DIFFERENTIAL/PLATELET - Abnormal; Notable for the following:    Lymphs Abs 0.6 (*)    All other components within normal limits  BASIC METABOLIC PANEL - Abnormal; Notable for the following:    Glucose, Bld 140 (*)    Creatinine, Ser 1.21 (*)    GFR calc non Af Amer 49 (*)    GFR calc Af Amer 56 (*)    All other components within normal limits  LACTIC ACID, PLASMA    Imaging Review Dg Chest 2 View  11/20/2015  CLINICAL DATA:  Productive cough and chest congestion. EXAM: CHEST  2 VIEW COMPARISON:  07/22/2015 FINDINGS: The heart size and mediastinal contours are within normal limits. Both lungs are clear. The visualized skeletal structures are unremarkable. IMPRESSION: Normal chest. Electronically Signed   By: Lorriane Shire M.D.   On: 11/20/2015 20:23   I have personally reviewed and evaluated these images and lab results as part of my medical decision-making.   EKG Interpretation None      MDM  Pt has temp elevations. Tylenol given with some improvement. Vital signs stable. Lactic acid wnl at 1.0. Bmet reveals Creat 1.21 and GFR low at 56. Chest xray is negative for acute problem. Pt much more comfortable  after IV fluids and pain meds. Recheck mostly unchanged from initial exam. Pt ambulatory without problem. Pt will use afrin for nasal congestion, and albuterol for wheezing or difficulty breathing. Rx for zithromax and decadron given to the patient. She will see her MD for recheck of chemistries. She will return immediately if any changes or problem..   Final diagnoses:  Bronchitis  URI (upper respiratory infection)    *I have reviewed nursing notes, vital signs, and all appropriate lab and imaging results for this patient.[address] PA-C 11/21/15 [id number]  Ezequiel Essex, MD 11/21/15 [phone]

## 2015-11-27 ENCOUNTER — Ambulatory Visit (INDEPENDENT_AMBULATORY_CARE_PROVIDER_SITE_OTHER): Payer: Medicare Other | Admitting: Family Medicine

## 2015-11-27 VITALS — BP 148/72 | HR 80 | Temp 98.4°F | Resp 22 | Ht 62.0 in | Wt 262.0 lb

## 2015-11-27 DIAGNOSIS — J189 Pneumonia, unspecified organism: Secondary | ICD-10-CM | POA: Diagnosis not present

## 2015-11-27 DIAGNOSIS — J209 Acute bronchitis, unspecified: Secondary | ICD-10-CM

## 2015-11-27 DIAGNOSIS — N289 Disorder of kidney and ureter, unspecified: Secondary | ICD-10-CM | POA: Diagnosis not present

## 2015-11-27 MED ORDER — HYDROCOD POLST-CPM POLST ER 10-8 MG/5ML PO SUER: 5.0000 mL | Freq: Two times a day (BID) | ORAL | Status: DC | PRN

## 2015-11-27 MED ORDER — LEVOFLOXACIN 500 MG PO TABS: 500.0000 mg | ORAL_TABLET | Freq: Every day | ORAL | Status: DC

## 2015-11-27 NOTE — Patient Instructions (Addendum)
Take cough medicine as prescribed Take antibiotics  Albuterol  Tussionex and antibiotics as prescribed F/u 4 WEEKS

## 2015-11-27 NOTE — Progress Notes (Signed)
Patient ID: KADEESHA REIMER, female   DOB: Nov 14, 1955, 60 y.o.   MRN: [id number]   Subjective:    Patient ID: ASATA MIGLIORE, female    DOB: 01-02-1956, 60 y.o.   MRN: [id number]  Patient presents for ER F/U  Seen in ER 1 week ago, after a few days of cough, SOB, sputum production worse at night. CXR neg , labs showed mild RI Cr 1.2. Given albuterol Steroids, Zpak, taking OTC cough medicine. Continues to have cough, has had fever for past 4 days. Feels she is worsening. Using inhaler which helps. Denies N/V, diarrhea, no rash, no body aches  Denies leg swelling     Review Of Systems:  GEN- denies fatigue, +fever, weight loss,weakness, recent illness HEENT- denies eye drainage, change in vision, nasal discharge, CVS- denies chest pain, palpitations RESP- denies SOB,+ cough, wheeze ABD- denies N/V, change in stools, abd pain Neuro- denies headache, dizziness, syncope, seizure activity       Objective:    BP 148/72 mmHg  Pulse 80  Temp(Src) 98.4 F (36.9 C) (Oral)  Resp 22  Ht 5\' 2"  (1.575 m)  Wt 262 lb (118.842 kg)  BMI 47.91 kg/m2  SpO2 98%  LMP 12/20/2011 GEN- NAD, alert and oriented x3 HEENT- PERRL, EOMI, non injected sclera, pink conjunctiva, MMM, oropharynx clear Neck- Supple, no LAD CVS- RRR, no murmur RESP-rales right base, upper airway congestion, normal WOB, hash cough, normal sat  EXT- No edema Pulses- Radial  2+        Assessment & Plan:      Problem List Items Addressed This Visit    None    Visit Diagnoses    Acute bronchitis, unspecified organism    -  Primary    Given Tussionex, exam has changed, concerned she is also devloping CAP, given Levaquin     Acute renal insufficiency        Renal function looks okay in setting of illness, minimal elevation in creatinine, will recheck in 4 weeks with fasting labs     CAP (community acquired pneumonia)        Relevant Medications    chlorpheniramine-HYDROcodone (TUSSIONEX PENNKINETIC ER) 10-8 MG/5ML  SUER    levofloxacin (LEVAQUIN) 500 MG tablet       Note: This dictation was prepared with Dragon dictation along with smaller phrase technology. Any transcriptional errors that result from this process are unintentional.

## 2015-11-28 ENCOUNTER — Telehealth: Payer: Self-pay | Admitting: Family Medicine

## 2015-11-28 MED ORDER — GUAIFENESIN-CODEINE 100-10 MG/5ML PO SOLN: 5.0000 mL | Freq: Four times a day (QID) | ORAL | Status: DC | PRN

## 2015-11-28 NOTE — Telephone Encounter (Signed)
MD please advise

## 2015-11-28 NOTE — Telephone Encounter (Signed)
Medication called to pharmacy.  Call placed to patient and patient made aware per VM.  

## 2015-11-28 NOTE — Telephone Encounter (Signed)
Okay to send in robitussin Codiene

## 2015-11-28 NOTE — Telephone Encounter (Signed)
Patient called to say that she could not afford the tussinex would like to know if another cough syrup can be called in  [facility] [place]  [phone]

## 2015-12-25 ENCOUNTER — Ambulatory Visit: Payer: Medicare Other | Admitting: Family Medicine

## 2016-01-01 ENCOUNTER — Encounter: Payer: Self-pay | Admitting: Family Medicine

## 2016-03-10 ENCOUNTER — Emergency Department (HOSPITAL_COMMUNITY)
Admission: EM | Admit: 2016-03-10 | Discharge: 2016-03-10 | Disposition: A | Discharge status: Home / Self Care | Payer: Medicare Other | Attending: Emergency Medicine | Admitting: Emergency Medicine

## 2016-03-10 ENCOUNTER — Encounter (HOSPITAL_COMMUNITY): Payer: Self-pay | Admitting: Emergency Medicine

## 2016-03-10 DIAGNOSIS — J069 Acute upper respiratory infection, unspecified: Secondary | ICD-10-CM | POA: Diagnosis not present

## 2016-03-10 DIAGNOSIS — I10 Essential (primary) hypertension: Secondary | ICD-10-CM | POA: Insufficient documentation

## 2016-03-10 DIAGNOSIS — H65191 Other acute nonsuppurative otitis media, right ear: Secondary | ICD-10-CM | POA: Diagnosis not present

## 2016-03-10 DIAGNOSIS — F329 Major depressive disorder, single episode, unspecified: Secondary | ICD-10-CM | POA: Insufficient documentation

## 2016-03-10 DIAGNOSIS — R52 Pain, unspecified: Secondary | ICD-10-CM | POA: Diagnosis present

## 2016-03-10 MED ORDER — ALBUTEROL SULFATE HFA 108 (90 BASE) MCG/ACT IN AERS
2.0000 | INHALATION_SPRAY | Freq: Once | RESPIRATORY_TRACT | Status: AC
  Administered 2016-03-10: 2 via RESPIRATORY_TRACT
  Filled 2016-03-10: qty 6.7

## 2016-03-10 MED ORDER — ONDANSETRON 8 MG PO TBDP
8.0000 mg | ORAL_TABLET | Freq: Once | ORAL | Status: AC
  Administered 2016-03-10: 8 mg via ORAL
  Filled 2016-03-10: qty 1

## 2016-03-10 MED ORDER — BENZONATATE 100 MG PO CAPS: 100.0000 mg | ORAL_CAPSULE | Freq: Three times a day (TID) | ORAL | Status: DC | PRN

## 2016-03-10 MED ORDER — AMOXICILLIN 500 MG PO CAPS: 500.0000 mg | ORAL_CAPSULE | Freq: Three times a day (TID) | ORAL | Status: DC

## 2016-03-10 MED ORDER — IBUPROFEN 800 MG PO TABS
800.0000 mg | ORAL_TABLET | Freq: Once | ORAL | Status: AC
  Administered 2016-03-10: 800 mg via ORAL
  Filled 2016-03-10: qty 1

## 2016-03-10 NOTE — ED Notes (Signed)
Inhaler found in room after patient discharged, patient called and notified. Patient's husband to come pick up inhaler.

## 2016-03-10 NOTE — ED Notes (Signed)
Pt states she has had a dry cough, sore throat, bilateral ear ache, and chills since Thursday.  States son was sick as well last week.

## 2016-03-10 NOTE — Discharge Instructions (Signed)
Otitis Media, Adult Otitis media is redness, soreness, and puffiness (swelling) in the space just behind your eardrum (middle ear). It may be caused by allergies or infection. It often happens along with a cold. HOME CARE  Take your medicine as told. Finish it even if you start to feel better.  Only take over-the-counter or prescription medicines for pain, discomfort, or fever as told by your doctor.  Follow up with your doctor as told. GET HELP IF:  You have otitis media only in one ear, or bleeding from your nose, or both.  You notice a lump on your neck.  You are not getting better in 3-5 days.  You feel worse instead of better. GET HELP RIGHT AWAY IF:   You have pain that is not helped with medicine.  You have puffiness, redness, or pain around your ear.  You get a stiff neck.  You cannot move part of your face (paralysis).  You notice that the bone behind your ear hurts when you touch it. MAKE SURE YOU:   Understand these instructions.  Will watch your condition.  Will get help right away if you are not doing well or get worse.   This information is not intended to replace advice given to you by your health care provider. Make sure you discuss any questions you have with your health care provider.   Document Released: 03/25/2008 Document Revised: 10/28/2014 Document Reviewed: 05/04/2013 Elsevier Interactive Patient Education 2016 [employer]  Upper Respiratory Infection, Adult Most upper respiratory infections (URIs) are caused by a virus. A URI affects the nose, throat, and upper air passages. The most common type of URI is often called "the common cold." HOME CARE   Take medicines only as told by your doctor.  Gargle warm saltwater or take cough drops to comfort your throat as told by your doctor.  Use a warm mist humidifier or inhale steam from a shower to increase air moisture. This may make it easier to breathe.  Drink enough fluid to keep your pee  (urine) clear or pale yellow.  Eat soups and other clear broths.  Have a healthy diet.  Rest as needed.  Go back to work when your fever is gone or your doctor says it is okay.  You may need to stay home longer to avoid giving your URI to others.  You can also wear a face mask and wash your hands often to prevent spread of the virus.  Use your inhaler more if you have asthma.  Do not use any tobacco products, including cigarettes, chewing tobacco, or electronic cigarettes. If you need help quitting, ask your doctor. GET HELP IF:  You are getting worse, not better.  Your symptoms are not helped by medicine.  You have chills.  You are getting more short of breath.  You have brown or red mucus.  You have yellow or brown discharge from your nose.  You have pain in your face, especially when you bend forward.  You have a fever.  You have puffy (swollen) neck glands.  You have pain while swallowing.  You have white areas in the back of your throat. GET HELP RIGHT AWAY IF:   You have very bad or constant:  Headache.  Ear pain.  Pain in your forehead, behind your eyes, and over your cheekbones (sinus pain).  Chest pain.  You have long-lasting (chronic) lung disease and any of the following:  Wheezing.  Long-lasting cough.  Coughing up blood.  A change  in your usual mucus.  You have a stiff neck.  You have changes in your:  Vision.  Hearing.  Thinking.  Mood. MAKE SURE YOU:   Understand these instructions.  Will watch your condition.  Will get help right away if you are not doing well or get worse.   This information is not intended to replace advice given to you by your health care provider. Make sure you discuss any questions you have with your health care provider.   Document Released: 03/25/2008 Document Revised: 02/21/2015 Document Reviewed: 01/12/2014 Elsevier Interactive Patient Education [employer].

## 2016-03-12 NOTE — ED Provider Notes (Signed)
CSN: [id number]     Arrival date & time 03/10/16  [id number] History   First MD Initiated Contact with Patient 03/10/16 [phone]     Chief Complaint  Patient presents with  . Generalized Body Aches     (Consider location/radiation/quality/duration/timing/severity/associated sxs/prior Treatment) HPI   Sheryl [name] is a 60 y.o. female who presents to the Emergency Department complaining of bilateral ear pain , cough and congestion for three days.  Cough has been non-productive and associated with wheezing.  She also reports having chills without known fever.  She reports family members with similar sx's recently.  She denies vomiting, chest or abdominal pain, vomiting or diarrhea.  Past Medical History  Diagnosis Date  . Depression   . Chronic neck pain 2013  . Chronic back pain 2013  . Hypertension    Past Surgical History  Procedure Laterality Date  . Breast cyst excision      right  . Tubal ligation     Family History  Problem Relation Age of Onset  . Hypertension Mother   . Depression Mother   . Hypertension Sister   . Hypertension Sister    Social History  Substance Use Topics  . Smoking status: Never Smoker   . Smokeless tobacco: Never Used  . Alcohol Use: No   OB History    No data available     Review of Systems  Constitutional: Negative for fever, chills, activity change and appetite change.  HENT: Positive for congestion, ear pain and rhinorrhea. Negative for facial swelling, sore throat and trouble swallowing.   Eyes: Negative for visual disturbance.  Respiratory: Positive for cough and wheezing. Negative for shortness of breath and stridor.   Gastrointestinal: Negative for nausea and vomiting.  Genitourinary: Negative for dysuria.  Musculoskeletal: Negative for back pain, neck pain and neck stiffness.  Skin: Negative.   Neurological: Negative for dizziness, weakness, numbness and headaches.  Hematological: Negative for adenopathy.  Psychiatric/Behavioral:  Negative for confusion.  All other systems reviewed and are negative.     Allergies  Review of patient's allergies indicates no known allergies.  Home Medications   Prior to Admission medications   Medication Sig Start Date End Date Taking? Authorizing Provider  albuterol (PROVENTIL HFA;VENTOLIN HFA) 108 (90 BASE) MCG/ACT inhaler Inhale 2 puffs into the lungs every 4 (four) hours as needed. 06/22/15   Rolland Porter, MD  amoxicillin (AMOXIL) 500 MG capsule Take 1 capsule (500 mg total) by mouth 3 (three) times daily. 03/10/16   Blessed Cotham, PA-C  benzonatate (TESSALON) 100 MG capsule Take 1 capsule (100 mg total) by mouth 3 (three) times daily as needed for cough. Swallow whole, do not chew 03/10/16   Lavante Toso, PA-C  escitalopram (LEXAPRO) 10 MG tablet Take 1 tablet (10 mg total) by mouth daily. Patient taking differently: Take 10 mg by mouth daily as needed (for nervousness).  04/18/15   Alycia Rossetti, MD  guaiFENesin-codeine 100-10 MG/5ML syrup Take 5 mLs by mouth every 6 (six) hours as needed for cough. 11/28/15   Alycia Rossetti, MD  levofloxacin (LEVAQUIN) 500 MG tablet Take 1 tablet (500 mg total) by mouth daily. 11/27/15   Alycia Rossetti, MD  losartan-hydrochlorothiazide (HYZAAR) 50-12.5 MG tablet Take 1 tablet by mouth daily. Patient not taking: Reported on 11/27/2015 07/25/15   Alycia Rossetti, MD   BP 124/68 mmHg  Pulse 72  Temp(Src) 98.2 F (36.8 C) (Oral)  Resp 16  Ht 5' (1.524 m)  Wt 120.203 kg  BMI 51.75 kg/m2  SpO2 98%  LMP 12/20/2011 Physical Exam  Constitutional: She is oriented to person, place, and time. She appears well-developed and well-nourished. No distress.  HENT:  Head: Normocephalic and atraumatic.  Right Ear: Ear canal normal. Tympanic membrane is erythematous.  Left Ear: Tympanic membrane and ear canal normal.  Nose: Mucosal edema and rhinorrhea present.  Mouth/Throat: Uvula is midline and mucous membranes are normal. No trismus in the jaw. No uvula  swelling. Posterior oropharyngeal erythema present. No oropharyngeal exudate, posterior oropharyngeal edema or tonsillar abscesses.  Erythema of the right TM with visualized loss of landmarks.  Eyes: Conjunctivae are normal.  Neck: Normal range of motion and phonation normal. Neck supple. No Brudzinski's sign and no Kernig's sign noted.  Cardiovascular: Normal rate, regular rhythm, normal heart sounds and intact distal pulses.   No murmur heard. Pulmonary/Chest: Effort normal. No respiratory distress. She has wheezes. She has no rales.  Mild inspiratory wheezes bilaterally.  No rales or respir distress  Abdominal: Soft. There is no tenderness. There is no rebound and no guarding.  Musculoskeletal: She exhibits no edema.  Lymphadenopathy:    She has no cervical adenopathy.  Neurological: She is alert and oriented to person, place, and time. She exhibits normal muscle tone. Coordination normal.  Skin: Skin is warm and dry.  Nursing note and vitals reviewed.   ED Course  Procedures (including critical care time) Labs Review Labs Reviewed - No data to display  Imaging Review No results found. I have personally reviewed and evaluated these images and lab results as part of my medical decision-making.   EKG Interpretation None      MDM   Final diagnoses:  URI (upper respiratory infection)  Acute nonsuppurative otitis media of right ear   Pt well appearing.  Non toxic.  Vitals stable.  Sx likely related to URI and OM. Albuterol inhaler dispensed.  Pt agrees to PMD f/u.  Stable for d/c    Kem Parkinson, PA-C 03/12/16 2112  Daleen Bo, MD 03/13/16 [phone]

## 2016-05-13 ENCOUNTER — Encounter (HOSPITAL_COMMUNITY): Payer: Self-pay | Admitting: Emergency Medicine

## 2016-05-13 ENCOUNTER — Emergency Department (HOSPITAL_COMMUNITY)
Admission: EM | Admit: 2016-05-13 | Discharge: 2016-05-13 | Disposition: A | Discharge status: Home / Self Care | Payer: Medicare Other | Attending: Emergency Medicine | Admitting: Emergency Medicine

## 2016-05-13 DIAGNOSIS — Y929 Unspecified place or not applicable: Secondary | ICD-10-CM | POA: Insufficient documentation

## 2016-05-13 DIAGNOSIS — I10 Essential (primary) hypertension: Secondary | ICD-10-CM | POA: Diagnosis not present

## 2016-05-13 DIAGNOSIS — F329 Major depressive disorder, single episode, unspecified: Secondary | ICD-10-CM | POA: Insufficient documentation

## 2016-05-13 DIAGNOSIS — Y999 Unspecified external cause status: Secondary | ICD-10-CM | POA: Insufficient documentation

## 2016-05-13 DIAGNOSIS — Y93G3 Activity, cooking and baking: Secondary | ICD-10-CM | POA: Insufficient documentation

## 2016-05-13 DIAGNOSIS — T23131A Burn of first degree of multiple right fingers (nail), not including thumb, initial encounter: Secondary | ICD-10-CM | POA: Insufficient documentation

## 2016-05-13 DIAGNOSIS — Z23 Encounter for immunization: Secondary | ICD-10-CM | POA: Diagnosis not present

## 2016-05-13 DIAGNOSIS — X101XXA Contact with hot food, initial encounter: Secondary | ICD-10-CM | POA: Insufficient documentation

## 2016-05-13 DIAGNOSIS — T23101A Burn of first degree of right hand, unspecified site, initial encounter: Secondary | ICD-10-CM | POA: Insufficient documentation

## 2016-05-13 DIAGNOSIS — T310 Burns involving less than 10% of body surface: Secondary | ICD-10-CM | POA: Diagnosis not present

## 2016-05-13 MED ORDER — HYDROCODONE-ACETAMINOPHEN 5-325 MG PO TABS: 1.0000 | ORAL_TABLET | ORAL | 0 refills | Status: DC | PRN

## 2016-05-13 MED ORDER — DIPHTH-ACELL PERTUSSIS-TETANUS 25-58-10 LF-MCG/0.5 IM SUSP
0.5000 mL | Freq: Once | INTRAMUSCULAR | Status: DC
  Filled 2016-05-13: qty 0.5

## 2016-05-13 MED ORDER — SILVER SULFADIAZINE 1 % EX CREA
TOPICAL_CREAM | Freq: Once | CUTANEOUS | Status: AC
  Administered 2016-05-13: 20:00:00 via TOPICAL
  Filled 2016-05-13: qty 50

## 2016-05-13 MED ORDER — HYDROCODONE-ACETAMINOPHEN 5-325 MG PO TABS
1.0000 | ORAL_TABLET | Freq: Once | ORAL | Status: AC
  Administered 2016-05-13: 1 via ORAL
  Filled 2016-05-13: qty 1

## 2016-05-13 MED ORDER — TETANUS-DIPHTH-ACELL PERTUSSIS 5-2.5-18.5 LF-MCG/0.5 IM SUSP
0.5000 mL | Freq: Once | INTRAMUSCULAR | Status: AC
  Administered 2016-05-13: 0.5 mL via INTRAMUSCULAR

## 2016-05-13 NOTE — Discharge Instructions (Signed)
Reapply a thin layer of the burn cream twice daily to your hand and fingers after washing with soap and water. You may take the hydrocodone prescribed for pain relief.  This will make you drowsy - do not drive within 4 hours of taking this medication.

## 2016-05-13 NOTE — ED Notes (Signed)
Pt verbalized understanding of no driving and to use caution within 4 hours of taking pain meds due to meds cause drowsiness 

## 2016-05-13 NOTE — ED Provider Notes (Signed)
[facility] DEPT Provider Note   CSN: [id number] Arrival date & time: 05/13/16  1755  First Provider Contact:  None     By signing my name below, I, [facility], attest that this documentation has been prepared under the direction and in the presence of Evalee Jefferson, PA-C. Electronically Signed: Virgel Bouquet, ED Scribe. 05/13/16. 10:54 PM.   History   Chief Complaint Chief Complaint  Patient presents with  . Burn    HPI Sheryl [name] is a 60 y.o. right handed female who presents to the Emergency Department complaining of a small, moderately painful, burn to her right hand that occurred shortly PTA. Pt states that she was baking chicken when a piece was about to fall out, she pushed it back in to the pan, and burned her right hand on both sides. She has soaked her hand in apple cider vinegar with slight relief. She is unable to recall the date of her last tetanus shot. Per pt, her PCP is Dr. Buelah Manis. Denies weakness.  The history is provided by the patient. No language interpreter was used.    Past Medical History:  Diagnosis Date  . Chronic back pain 2013  . Chronic neck pain 2013  . Depression   . Hypertension     Patient Active Problem List   Diagnosis Date Noted  . Bullous impetigo 07/26/2015  . OAB (overactive bladder) 07/25/2015  . Stress incontinence 07/25/2015  . Rotator cuff syndrome of left shoulder 12/19/2014  . Difficulty in walking(719.7) 05/06/2012  . Stiffness of vertebral column 05/06/2012  . Decreased strength 05/06/2012  . Morbid obesity ([place]) 04/16/2012  . MDD (major depressive disorder) ([place]) 04/16/2012  . Essential hypertension, benign 04/16/2012  . Back pain 04/16/2012  . Cervical strain, acute 04/16/2012    Past Surgical History:  Procedure Laterality Date  . BREAST CYST EXCISION     right  . TUBAL LIGATION      OB History    No data available       Home Medications    Prior to Admission medications   Medication  Sig Start Date End Date Taking? Authorizing Provider  albuterol (PROVENTIL HFA;VENTOLIN HFA) 108 (90 BASE) MCG/ACT inhaler Inhale 2 puffs into the lungs every 4 (four) hours as needed. 06/22/15   Rolland Porter, MD  amoxicillin (AMOXIL) 500 MG capsule Take 1 capsule (500 mg total) by mouth 3 (three) times daily. 03/10/16   Tammy Triplett, PA-C  benzonatate (TESSALON) 100 MG capsule Take 1 capsule (100 mg total) by mouth 3 (three) times daily as needed for cough. Swallow whole, do not chew 03/10/16   Tammy Triplett, PA-C  escitalopram (LEXAPRO) 10 MG tablet Take 1 tablet (10 mg total) by mouth daily. Patient taking differently: Take 10 mg by mouth daily as needed (for nervousness).  04/18/15   Alycia Rossetti, MD  guaiFENesin-codeine 100-10 MG/5ML syrup Take 5 mLs by mouth every 6 (six) hours as needed for cough. 11/28/15   Alycia Rossetti, MD  HYDROcodone-acetaminophen (NORCO/VICODIN) 5-325 MG tablet Take 1 tablet by mouth every 4 (four) hours as needed. 05/13/16   Evalee Jefferson, PA-C  levofloxacin (LEVAQUIN) 500 MG tablet Take 1 tablet (500 mg total) by mouth daily. 11/27/15   Alycia Rossetti, MD  losartan-hydrochlorothiazide (HYZAAR) 50-12.5 MG tablet Take 1 tablet by mouth daily. Patient not taking: Reported on 11/27/2015 07/25/15   Alycia Rossetti, MD    Family History Family History  Problem Relation Age of Onset  . Hypertension Mother   .  Depression Mother   . Hypertension Sister   . Hypertension Sister     Social History Social History  Substance Use Topics  . Smoking status: Never Smoker  . Smokeless tobacco: Never Used  . Alcohol use No     Allergies   Review of patient's allergies indicates no known allergies.   Review of Systems Review of Systems  Skin: Positive for color change.  Neurological: Negative for weakness.     Physical Exam Updated Vital Signs BP 161/79 (BP Location: Left Arm)   Pulse 77   Temp 98.3 F (36.8 C) (Oral)   Resp 16   Ht 5\' 1"  (1.549 m)   Wt 127 kg    LMP 12/20/2011   SpO2 100%   BMI 52.91 kg/m   Physical Exam  Constitutional: She is oriented to person, place, and time. She appears well-developed and well-nourished. No distress.  HENT:  Head: Normocephalic and atraumatic.  Eyes: Conjunctivae are normal.  Neck: Normal range of motion.  Cardiovascular: Normal rate.   Pulmonary/Chest: Effort normal. No respiratory distress.  Musculoskeletal: Normal range of motion.  Neurological: She is alert and oriented to person, place, and time.  Skin: Skin is warm and dry. Burn noted.  1st degree burns on right hand dorsally along proximal fingers and over 2nd, third and 4th distal metacarpals. Also endorses pain on volar fingers and across distal palm but there is no visible sign of burn on the volar aspect. Skin is intact. Distal sensation is intact. Distal cap refill less than 3 secs.  Psychiatric: She has a normal mood and affect. Her behavior is normal.  Nursing note and vitals reviewed.    ED Treatments / Results  DIAGNOSTIC STUDIES: Oxygen Saturation is 100% on RA, normal by my interpretation.    COORDINATION OF CARE: 7:49 PM Discussed updating tetanus in ED today and pt agreed to ordering this medication today. Will order Silvadene cream, apply in ED, and apply dressing. Will prescribe pain medication. Discussed treatment plan with pt at bedside and pt agreed to plan.   Procedures Procedures   Medications Ordered in ED Medications  HYDROcodone-acetaminophen (NORCO/VICODIN) 5-325 MG per tablet 1 tablet (1 tablet Oral Given 05/13/16 2004)  silver sulfADIAZINE (SILVADENE) 1 % cream ( Topical Given 05/13/16 2005)  Tdap (BOOSTRIX) injection 0.5 mL (0.5 mLs Intramuscular Given 05/13/16 2005)     Initial Impression / Assessment and Plan / ED Course  I have reviewed the triage vital signs and the nursing notes.  Pertinent labs & imaging results that were available during my care of the patient were reviewed by me and considered in my  medical decision making (see chart for details).  Clinical Course    Pt was also seen by Dr. Wyvonnia Dusky prior to dc home.  Final Clinical Impressions(s) / ED Diagnoses   Final diagnoses:  Burn of right hand including fingers, first degree, initial encounter    New Prescriptions Discharge Medication List as of 05/13/2016  8:33 PM    START taking these medications   Details  HYDROcodone-acetaminophen (NORCO/VICODIN) 5-325 MG tablet Take 1 tablet by mouth every 4 (four) hours as needed., Starting Mon 05/13/2016, Print       I personally performed the services described in this documentation, which was scribed in my presence. The recorded information has been reviewed and is accurate.    Evalee Jefferson, PA-C 05/15/16 [place], MD 05/15/16 [phone]

## 2016-05-13 NOTE — ED Triage Notes (Signed)
Pt reports was burned while cooking chicken on the stove. Pt is currently soaking hand in vinegar. Pt reports generalized stinging sensation in right hand. Skin intact. Mild redness noted to all digits. nad noted.

## 2016-06-13 ENCOUNTER — Other Ambulatory Visit: Payer: Self-pay | Admitting: Family Medicine

## 2016-06-13 NOTE — Telephone Encounter (Signed)
Refill appropriate and filled per protocol. 

## 2016-07-30 ENCOUNTER — Other Ambulatory Visit: Payer: Self-pay | Admitting: Family Medicine

## 2016-07-30 DIAGNOSIS — Z1231 Encounter for screening mammogram for malignant neoplasm of breast: Secondary | ICD-10-CM

## 2016-09-02 ENCOUNTER — Ambulatory Visit (HOSPITAL_COMMUNITY): Payer: Medicare Other

## 2016-10-27 ENCOUNTER — Other Ambulatory Visit: Payer: Self-pay | Admitting: Family Medicine

## 2016-10-28 ENCOUNTER — Ambulatory Visit (HOSPITAL_COMMUNITY)
Admission: RE | Admit: 2016-10-28 | Discharge: 2016-10-28 | Disposition: A | Discharge status: Home / Self Care | Payer: Medicare Other | Source: Ambulatory Visit | Attending: Family Medicine | Admitting: Family Medicine

## 2016-10-28 DIAGNOSIS — Z1231 Encounter for screening mammogram for malignant neoplasm of breast: Secondary | ICD-10-CM | POA: Diagnosis not present

## 2016-10-29 ENCOUNTER — Other Ambulatory Visit: Payer: Self-pay | Admitting: Family Medicine

## 2016-10-29 DIAGNOSIS — R928 Other abnormal and inconclusive findings on diagnostic imaging of breast: Secondary | ICD-10-CM

## 2016-11-12 ENCOUNTER — Ambulatory Visit (HOSPITAL_COMMUNITY)
Admission: RE | Admit: 2016-11-12 | Discharge: 2016-11-12 | Disposition: A | Discharge status: Home / Self Care | Payer: Medicare Other | Source: Ambulatory Visit | Attending: Family Medicine | Admitting: Family Medicine

## 2016-11-12 DIAGNOSIS — R921 Mammographic calcification found on diagnostic imaging of breast: Secondary | ICD-10-CM | POA: Diagnosis not present

## 2016-11-12 DIAGNOSIS — R928 Other abnormal and inconclusive findings on diagnostic imaging of breast: Secondary | ICD-10-CM | POA: Insufficient documentation

## 2016-11-13 ENCOUNTER — Emergency Department (HOSPITAL_COMMUNITY)
Admission: EM | Admit: 2016-11-13 | Discharge: 2016-11-13 | Disposition: A | Discharge status: Home / Self Care | Payer: Medicare Other | Attending: Emergency Medicine | Admitting: Emergency Medicine

## 2016-11-13 ENCOUNTER — Encounter (HOSPITAL_COMMUNITY): Payer: Self-pay | Admitting: *Deleted

## 2016-11-13 DIAGNOSIS — I10 Essential (primary) hypertension: Secondary | ICD-10-CM | POA: Insufficient documentation

## 2016-11-13 DIAGNOSIS — Y999 Unspecified external cause status: Secondary | ICD-10-CM | POA: Diagnosis not present

## 2016-11-13 DIAGNOSIS — Z23 Encounter for immunization: Secondary | ICD-10-CM | POA: Insufficient documentation

## 2016-11-13 DIAGNOSIS — X58XXXA Exposure to other specified factors, initial encounter: Secondary | ICD-10-CM | POA: Diagnosis not present

## 2016-11-13 DIAGNOSIS — Y939 Activity, unspecified: Secondary | ICD-10-CM | POA: Insufficient documentation

## 2016-11-13 DIAGNOSIS — Y929 Unspecified place or not applicable: Secondary | ICD-10-CM | POA: Insufficient documentation

## 2016-11-13 DIAGNOSIS — S0591XA Unspecified injury of right eye and orbit, initial encounter: Secondary | ICD-10-CM | POA: Diagnosis present

## 2016-11-13 DIAGNOSIS — S0501XA Injury of conjunctiva and corneal abrasion without foreign body, right eye, initial encounter: Secondary | ICD-10-CM | POA: Diagnosis not present

## 2016-11-13 MED ORDER — KETOROLAC TROMETHAMINE 0.5 % OP SOLN
1.0000 [drp] | Freq: Four times a day (QID) | OPHTHALMIC | Status: DC | PRN
  Administered 2016-11-13: 1 [drp] via OPHTHALMIC
  Filled 2016-11-13: qty 5

## 2016-11-13 MED ORDER — TETANUS-DIPHTH-ACELL PERTUSSIS 5-2.5-18.5 LF-MCG/0.5 IM SUSP
INTRAMUSCULAR | Status: AC
  Filled 2016-11-13: qty 0.5

## 2016-11-13 MED ORDER — TOBRAMYCIN 0.3 % OP SOLN
2.0000 [drp] | OPHTHALMIC | Status: DC
  Administered 2016-11-13: 2 [drp] via OPHTHALMIC
  Filled 2016-11-13: qty 5

## 2016-11-13 MED ORDER — TETANUS-DIPHTH-ACELL PERTUSSIS 5-2.5-18.5 LF-MCG/0.5 IM SUSP
0.5000 mL | Freq: Once | INTRAMUSCULAR | Status: AC
  Administered 2016-11-13: 0.5 mL via INTRAMUSCULAR

## 2016-11-13 MED ORDER — TETRACAINE HCL 0.5 % OP SOLN
OPHTHALMIC | Status: AC
  Filled 2016-11-13: qty 4

## 2016-11-13 MED ORDER — FLUORESCEIN SODIUM 0.6 MG OP STRP
ORAL_STRIP | OPHTHALMIC | Status: AC
  Filled 2016-11-13: qty 1

## 2016-11-13 NOTE — Discharge Instructions (Signed)
Use the ocular eye drops in the right eye every 6 hrs for pain. Use the antibiotic eye drops in the right eye every 4 hrs while awake. Call Dr Iona Hansen' office to get an appointment to be rechecked tomorrow or the next day.

## 2016-11-13 NOTE — ED Provider Notes (Signed)
[facility] DEPT Provider Note   CSN: [id number] Arrival date & time: 11/13/16  0110  Time seen 02:50 AM   History   Chief Complaint Chief Complaint  Patient presents with  . Eye Problem    HPI Sheryl [name] is a 61 y.o. female.  HPI  patient states a grandchild is now 27 years old.her in the right eye with her fingernail when the child was an infant. She states periodically since then she has problems with her right eye. She stated it happens every couple months. She has never followed up to have that reevaluated. She states January 23 she woke up in the morning and felt like she had something in her right eye. She states she was rubbing and trying to get it  out however she continues to have that feeling. She states she's been having a lot of clear tearing without purulent drainage. She denies any visual loss. She does describe light sensitivity and increased pain when she blinks. She is unsure of when her last tetanus immunizations was.  PCP Vic Blackbird, MD   Past Medical History:  Diagnosis Date  . Chronic back pain 2013  . Chronic neck pain 2013  . Depression   . Hypertension     Patient Active Problem List   Diagnosis Date Noted  . Bullous impetigo 07/26/2015  . OAB (overactive bladder) 07/25/2015  . Stress incontinence 07/25/2015  . Rotator cuff syndrome of left shoulder 12/19/2014  . Difficulty in walking(719.7) 05/06/2012  . Stiffness of vertebral column 05/06/2012  . Decreased strength 05/06/2012  . Morbid obesity ([place]) 04/16/2012  . MDD (major depressive disorder) 04/16/2012  . Essential hypertension, benign 04/16/2012  . Back pain 04/16/2012  . Cervical strain, acute 04/16/2012    Past Surgical History:  Procedure Laterality Date  . BREAST CYST EXCISION     right  . TUBAL LIGATION      OB History    No data available       Home Medications    Prior to Admission medications   Medication Sig Start Date End Date Taking? Authorizing  Provider  albuterol (PROVENTIL HFA;VENTOLIN HFA) 108 (90 BASE) MCG/ACT inhaler Inhale 2 puffs into the lungs every 4 (four) hours as needed. 06/22/15   Rolland Porter, MD  amoxicillin (AMOXIL) 500 MG capsule Take 1 capsule (500 mg total) by mouth 3 (three) times daily. 03/10/16   Tammy Triplett, PA-C  benzonatate (TESSALON) 100 MG capsule Take 1 capsule (100 mg total) by mouth 3 (three) times daily as needed for cough. Swallow whole, do not chew 03/10/16   Tammy Triplett, PA-C  escitalopram (LEXAPRO) 10 MG tablet TAKE 1 TABLET(10 MG) BY MOUTH DAILY 10/28/16   Alycia Rossetti, MD  guaiFENesin-codeine 100-10 MG/5ML syrup Take 5 mLs by mouth every 6 (six) hours as needed for cough. 11/28/15   Alycia Rossetti, MD  HYDROcodone-acetaminophen (NORCO/VICODIN) 5-325 MG tablet Take 1 tablet by mouth every 4 (four) hours as needed. 05/13/16   Evalee Jefferson, PA-C  levofloxacin (LEVAQUIN) 500 MG tablet Take 1 tablet (500 mg total) by mouth daily. 11/27/15   Alycia Rossetti, MD  losartan-hydrochlorothiazide [facility]) 50-12.5 MG tablet TAKE 1 TABLET BY MOUTH DAILY 10/28/16   Alycia Rossetti, MD    Family History Family History  Problem Relation Age of Onset  . Hypertension Mother   . Depression Mother   . Hypertension Sister   . Hypertension Sister     Social History Social History  Substance Use Topics  .  Smoking status: Never Smoker  . Smokeless tobacco: Never Used  . Alcohol use No  on disability for depression   Allergies   Patient has no known allergies.   Review of Systems Review of Systems  All other systems reviewed and are negative.    Physical Exam Updated Vital Signs BP 190/98   Pulse 86   Temp 98.3 F (36.8 C) (Oral)   Resp 16   Ht 5' (1.524 m)   Wt 270 lb (122.5 kg)   LMP 12/20/2011   SpO2 98%   BMI 52.73 kg/m   Physical Exam  Constitutional: She is oriented to person, place, and time. She appears well-developed and well-nourished. She appears distressed.  HENT:  Head:  Normocephalic and atraumatic.  Right Ear: External ear normal.  Left Ear: External ear normal.  Nose: Nose normal.  Eyes: EOM are normal.  Patient has diffuse injection of her conjunctiva of the right eye. Tetracaine was placed in the right eye and fluorescent stain was applied. Patient is noted to have a large corneal abrasion on the inferior medial aspect of her right cornea. There is no hyphema present.  Neck: Normal range of motion. Neck supple.  Cardiovascular: Normal rate.   Pulmonary/Chest: Effort normal. No respiratory distress.  Musculoskeletal: Normal range of motion.  Neurological: She is alert and oriented to person, place, and time.  Skin: Skin is warm and dry.  Nursing note and vitals reviewed.      ED Treatments / Results    Procedures Procedures (including critical care time)  Medications Ordered in ED Medications  tetracaine (PONTOCAINE) 0.5 % ophthalmic solution (not administered)  fluorescein 0.6 MG ophthalmic strip (not administered)  tobramycin (TOBREX) 0.3 % ophthalmic solution 2 drop (not administered)  ketorolac (ACULAR) 0.5 % ophthalmic solution 1 drop (not administered)  Tdap (BOOSTRIX) injection 0.5 mL (not administered)     Initial Impression / Assessment and Plan / ED Course  I have reviewed the triage vital signs and the nursing notes.  Pertinent labs & imaging results that were available during my care of the patient were reviewed by me and considered in my medical decision making (see chart for details).    On exam patient is noted to have a fairly large corneal abrasion of her right cornea. She was started on tobramycin eyedrops and ketorolac eyedrops for pain. She is to follow up with Dr. Iona Hansen, ophthalmologist in [place] in the next 1-2 days.  Final Clinical Impressions(s) / ED Diagnoses   Final diagnoses:  Abrasion of right cornea, initial encounter    Plan discharge  Rolland Porter, MD, Barbette Or, MD 11/13/16 [phone]

## 2016-11-13 NOTE — ED Triage Notes (Signed)
Pt c/o redness, swelling and drainage to right eye that started yesterday , pt states that she had an eye injury about 10 years ago to same eye,

## 2016-11-18 DIAGNOSIS — S0500XA Injury of conjunctiva and corneal abrasion without foreign body, unspecified eye, initial encounter: Secondary | ICD-10-CM | POA: Diagnosis not present

## 2016-11-19 ENCOUNTER — Ambulatory Visit (INDEPENDENT_AMBULATORY_CARE_PROVIDER_SITE_OTHER): Payer: Medicare Other

## 2016-11-19 DIAGNOSIS — Z23 Encounter for immunization: Secondary | ICD-10-CM

## 2016-12-23 DIAGNOSIS — S0500XA Injury of conjunctiva and corneal abrasion without foreign body, unspecified eye, initial encounter: Secondary | ICD-10-CM | POA: Diagnosis not present

## 2016-12-24 ENCOUNTER — Encounter: Payer: Self-pay | Admitting: Family Medicine

## 2016-12-24 ENCOUNTER — Ambulatory Visit (INDEPENDENT_AMBULATORY_CARE_PROVIDER_SITE_OTHER): Payer: Medicare Other | Admitting: Family Medicine

## 2016-12-24 VITALS — BP 142/78 | HR 80 | Temp 97.9°F | Resp 16 | Ht 62.0 in | Wt 269.0 lb

## 2016-12-24 DIAGNOSIS — I10 Essential (primary) hypertension: Secondary | ICD-10-CM

## 2016-12-24 DIAGNOSIS — F332 Major depressive disorder, recurrent severe without psychotic features: Secondary | ICD-10-CM | POA: Diagnosis not present

## 2016-12-24 DIAGNOSIS — M25562 Pain in left knee: Secondary | ICD-10-CM

## 2016-12-24 LAB — CBC WITH DIFFERENTIAL/PLATELET
BASOS PCT: 1 %
Basophils Absolute: 73 cells/uL (ref 0–200)
EOS ABS: 219 {cells}/uL (ref 15–500)
Eosinophils Relative: 3 %
HEMATOCRIT: 41.6 % (ref 35.0–45.0)
Hemoglobin: 13.5 g/dL (ref 12.0–15.0)
Lymphocytes Relative: 30 %
Lymphs Abs: 2190 cells/uL (ref 850–3900)
MCH: 28.1 pg (ref 27.0–33.0)
MCHC: 32.5 g/dL (ref 32.0–36.0)
MCV: 86.7 fL (ref 80.0–100.0)
MPV: 9.3 fL (ref 7.5–12.5)
Monocytes Absolute: 438 cells/uL (ref 200–950)
Monocytes Relative: 6 %
Neutro Abs: 4380 cells/uL (ref 1500–7800)
Neutrophils Relative %: 60 %
PLATELETS: 265 10*3/uL (ref 140–400)
RBC: 4.8 MIL/uL (ref 3.80–5.10)
RDW: 13.8 % (ref 11.0–15.0)
WBC: 7.3 10*3/uL (ref 3.8–10.8)

## 2016-12-24 LAB — LIPID PANEL
CHOL/HDL RATIO: 3.7 ratio (ref <5.0)
CHOLESTEROL: 174 mg/dL (ref <200)
HDL: 47 mg/dL — ABNORMAL LOW (ref >50)
LDL Cholesterol: 111 mg/dL — ABNORMAL HIGH (ref <100)
Triglycerides: 78 mg/dL (ref <150)
VLDL: 16 mg/dL (ref <30)

## 2016-12-24 LAB — COMPREHENSIVE METABOLIC PANEL
ALK PHOS: 107 U/L (ref 33–130)
ALT: 18 U/L (ref 6–29)
AST: 23 U/L (ref 10–35)
Albumin: 3.9 g/dL (ref 3.6–5.1)
BUN: 12 mg/dL (ref 7–25)
CALCIUM: 9.6 mg/dL (ref 8.6–10.4)
CO2: 27 mmol/L (ref 20–31)
Chloride: 104 mmol/L (ref 98–110)
Creat: 1.25 mg/dL — ABNORMAL HIGH (ref 0.50–1.05)
Glucose, Bld: 85 mg/dL (ref 70–99)
POTASSIUM: 4.3 mmol/L (ref 3.5–5.3)
Sodium: 141 mmol/L (ref 135–146)
TOTAL PROTEIN: 7.3 g/dL (ref 6.1–8.1)
Total Bilirubin: 0.3 mg/dL (ref 0.2–1.2)

## 2016-12-24 MED ORDER — LOSARTAN POTASSIUM-HCTZ 50-12.5 MG PO TABS: 1.0000 | ORAL_TABLET | Freq: Every day | ORAL | 2 refills | Status: DC

## 2016-12-24 MED ORDER — ESCITALOPRAM OXALATE 10 MG PO TABS: ORAL_TABLET | ORAL | 2 refills | Status: DC

## 2016-12-24 NOTE — Patient Instructions (Addendum)
Get the xray done of your knee We will call with lab results Restart your lexapro and blood pressure medication Take tylenol for knee pain as needed  F/U 4 weeks

## 2016-12-24 NOTE — Assessment & Plan Note (Signed)
Restart lexapro, no red flags F/U 4 weeks Discussed her need to discuss her concerns with her son about the childcare and this being too much for her.

## 2016-12-24 NOTE — Assessment & Plan Note (Signed)
He is morbidly obese with little activity. We discussed cutting out sodas her first that towards weight loss and increasing her water. We'll try to get her onboard with cutting out fast food substituting healthier choices throughout the day.

## 2016-12-24 NOTE — Progress Notes (Signed)
Subjective:    Patient ID: Sheryl [name], female    DOB: 04/06/1956, 61 y.o.   MRN: [id number]  Patient presents for L Leg Pain (x months- states that she has increased pain in leg and sometimes feels knot in the calf) and Medication Management  To follow-up on medications. She was last seen in February 2017. She was last on Lexapro for major depression which she had been doing well with. He was restarted in March 2016. She states that she has been stressed taking care of her sons grandchildren she feels like she needs a break. She has been ordered off for medication the past few months often missing days on end.  Hypertension-out of BP medication, hyzaar , not taking regulary   Left leg- has has pain on and off behind knee, had a fall back in 2000, no swelling of knee, he does get some popping at times. It has not locked up on her. She has not had any recent falls. At times she has felt a knot in the back of her knee  Review Of Systems:  GEN- denies fatigue, fever, weight loss,weakness, recent illness HEENT- denies eye drainage, change in vision, nasal discharge, CVS- denies chest pain, palpitations RESP- denies SOB, cough, wheeze ABD- denies N/V, change in stools, abd pain GU- denies dysuria, hematuria, dribbling, incontinence MSK- +joint pain, muscle aches, injury Neuro- denies headache, dizziness, syncope, seizure activity       Objective:    BP (!) 142/78   Pulse 80   Temp 97.9 F (36.6 C) (Oral)   Resp 16   Ht 5\' 2"  (1.575 m)   Wt 269 lb (122 kg)   LMP 12/20/2011   SpO2 98%   BMI 49.20 kg/m  GEN- NAD, alert and oriented x3 HEENT- PERRL, EOMI, non injected sclera, pink conjunctiva, MMM, oropharynx clear Neck- Supple, no thyromegaly CVS- RRR, no murmur RESP-CTAB MSK-Bilat knee - normal inspection, fair ROM, no nodule or swelling palpated, +crepitus, antalgic gait, calf NT, neg homans  Psych- normal affect and mood, a little stressed appearing, not anxious, no SI   EXT- No edema Pulses- Radial, DP- 2+        Assessment & Plan:      Problem List Items Addressed This Visit    Morbid obesity ([place])    He is morbidly obese with little activity. We discussed cutting out sodas her first that towards weight loss and increasing her water. We'll try to get her onboard with cutting out fast food substituting healthier choices throughout the day.      Relevant Orders   Lipid panel   MDD (major depressive disorder) - Primary    Restart lexapro, no red flags F/U 4 weeks Discussed her need to discuss her concerns with her son about the childcare and this being too much for her.      Relevant Medications   escitalopram (LEXAPRO) 10 MG tablet   Essential hypertension, benign    Blood pressure is uncontrolled however she is not taking her medications as prescribed Discharge doing this today.      Relevant Medications   losartan-hydrochlorothiazide (HYZAAR) 50-12.5 MG tablet   Other Relevant Orders   CBC with Differential/Platelet   Comprehensive metabolic panel   Lipid panel    Other Visit Diagnoses    Acute pain of left knee       Acute on chronic PAIN, concern for DJD of knee, possible popliteal cyst but not significant on exam. obtain xray, tylenol for  pain   Relevant Orders   DG Knee Complete 4 Views Left      Note: This dictation was prepared with Dragon dictation along with smaller phrase technology. Any transcriptional errors that result from this process are unintentional.

## 2016-12-24 NOTE — Assessment & Plan Note (Signed)
Blood pressure is uncontrolled however she is not taking her medications as prescribed Discharge doing this today.

## 2017-01-25 IMAGING — MG DIGITAL DIAGNOSTIC UNILATERAL LEFT MAMMOGRAM
3 series · 3 of 3 positions shown · non-contrast
Comparison: 10/28/2016 and earlier priors

ACR Breast Density Category a: The breast tissue is almost entirely
fatty.

CLINICAL DATA: 60-year-old patient recalled from screening
mammogram for evaluation of calcifications in the left breast.

EXAM:
DIGITAL DIAGNOSTIC LEFT MAMMOGRAM

[L CC]
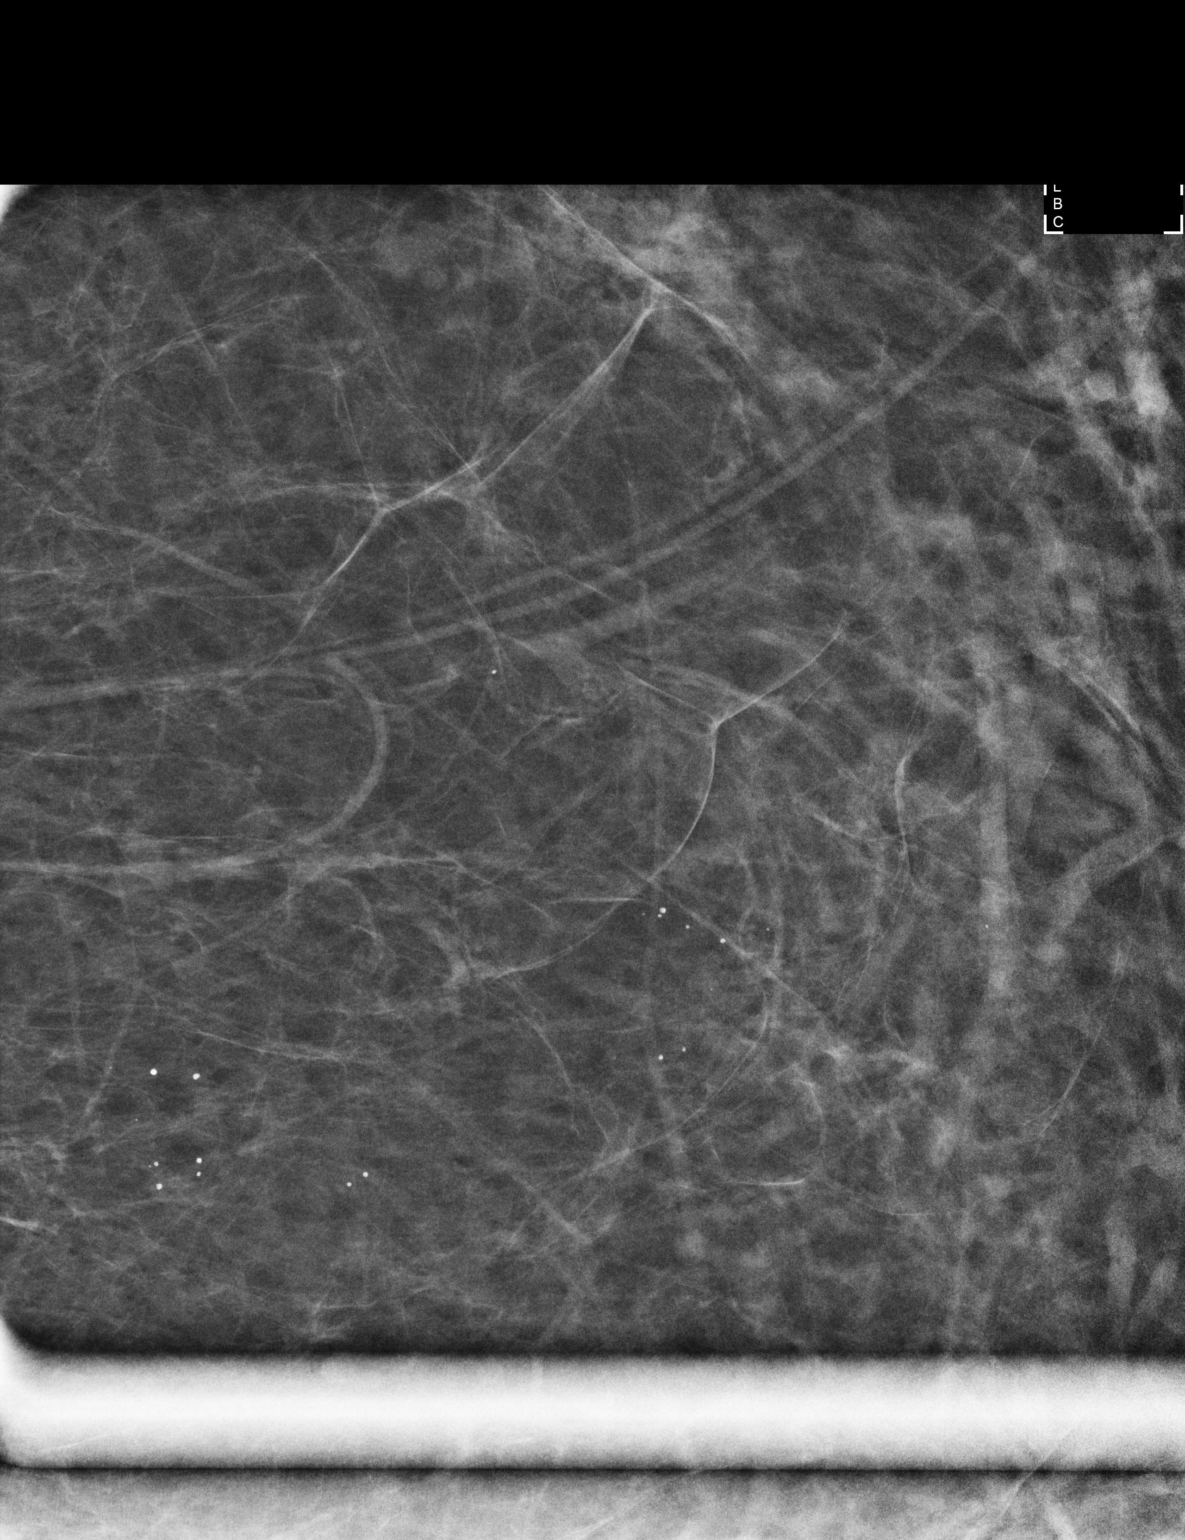

[L ML (1 of 2)]
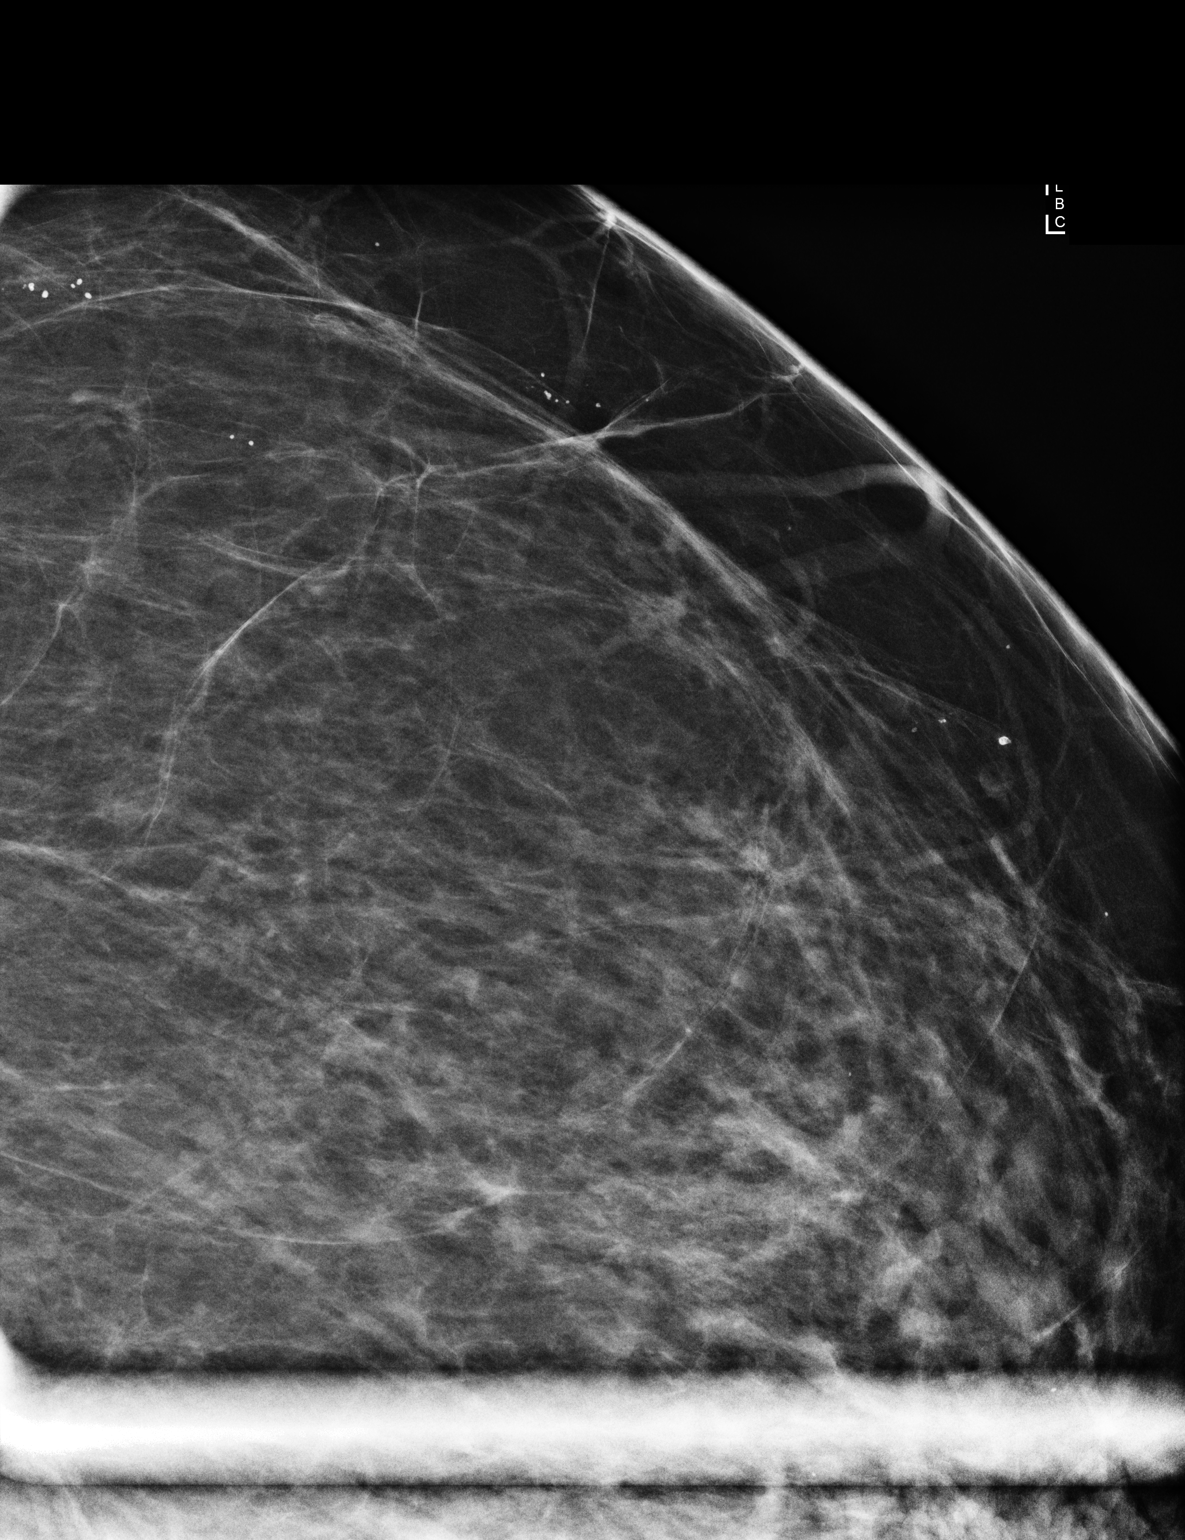

[L ML (2 of 2)]
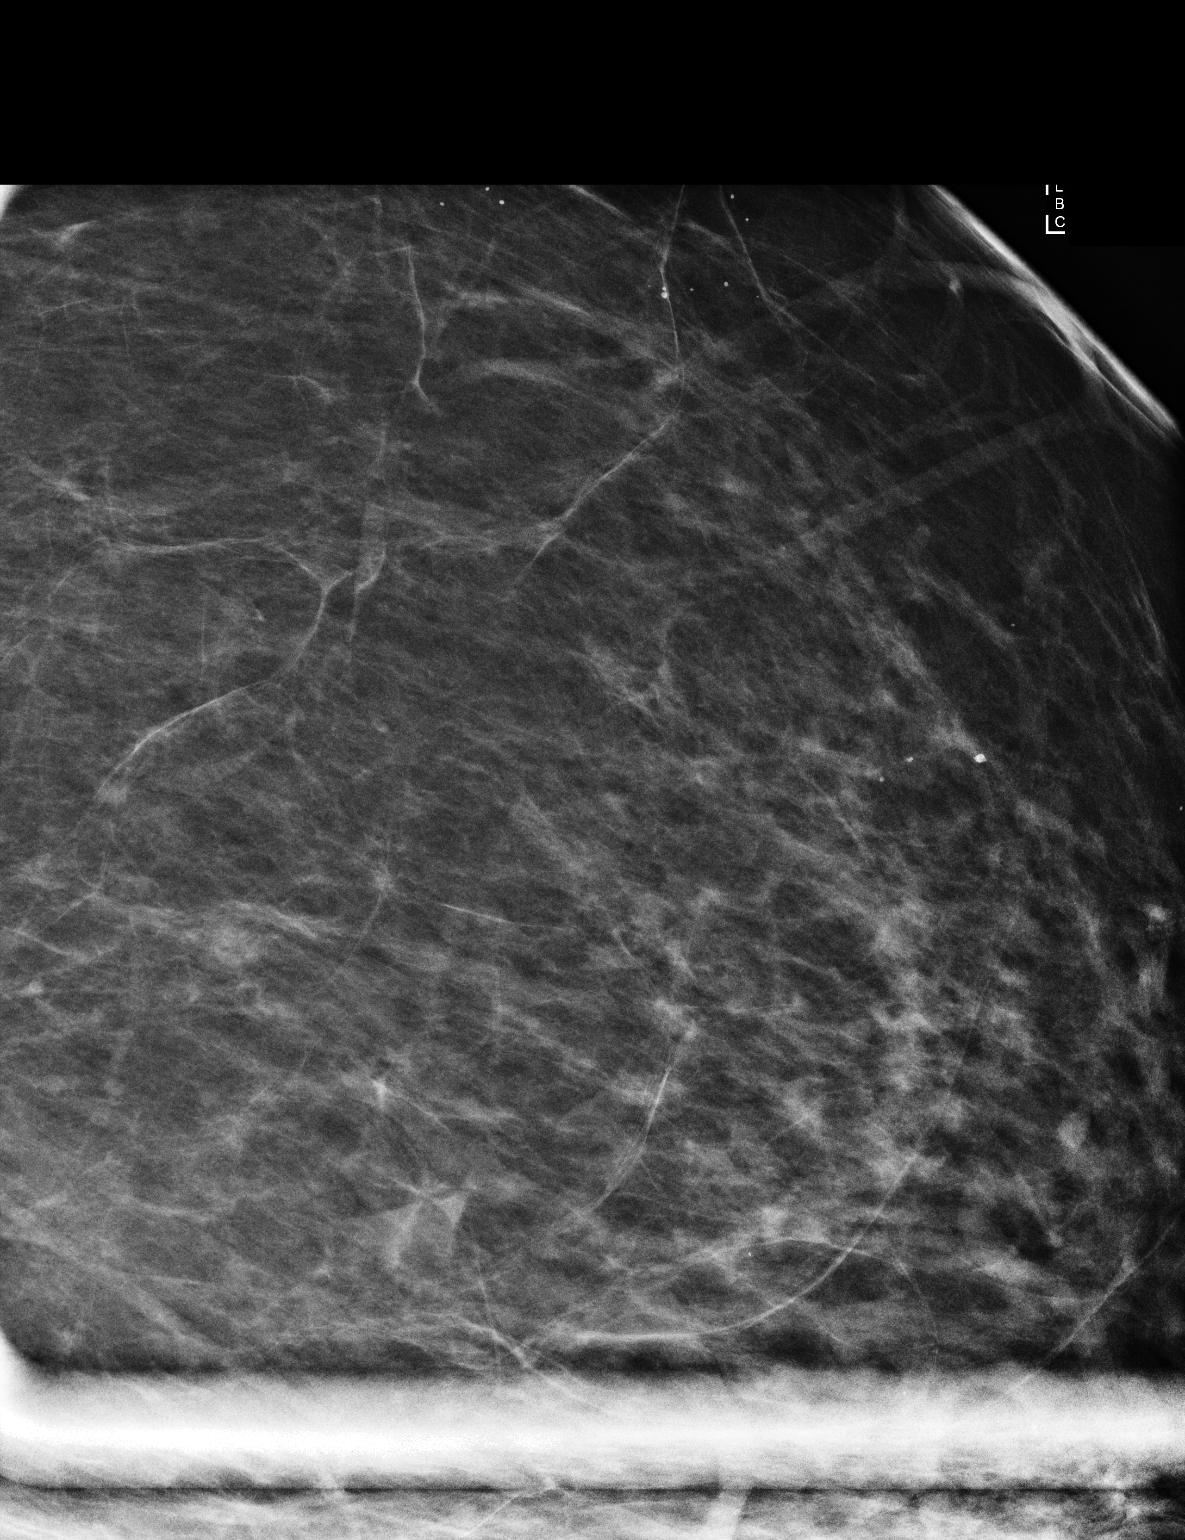

[3 of 3 positions shown; findings below may reference images not displayed]

FINDINGS: Magnification views of the upper outer left breast show multiple
superficial coarse dystrophic calcifications, some of which have
lucent centers. These appear similar to a few scattered dystrophic
calcifications in the contralateral breast. No suspicious
microcalcifications are identified.
IMPRESSION: Benign dystrophic calcifications in the upper outer left breast.

RECOMMENDATION:
Screening mammogram in one year.(Code:[id number])

I have discussed the findings and recommendations with the patient.
Results were also provided in writing at the conclusion of the
visit. If applicable, a reminder letter will be sent to the patient
regarding the next appointment.

BI-RADS CATEGORY  2: Benign.

## 2017-01-27 ENCOUNTER — Encounter: Payer: Self-pay | Admitting: Family Medicine

## 2017-01-27 ENCOUNTER — Ambulatory Visit (INDEPENDENT_AMBULATORY_CARE_PROVIDER_SITE_OTHER): Payer: Medicare Other | Admitting: Family Medicine

## 2017-01-27 VITALS — BP 144/80 | HR 88 | Temp 98.4°F | Resp 14 | Ht 62.0 in | Wt 270.0 lb

## 2017-01-27 DIAGNOSIS — I10 Essential (primary) hypertension: Secondary | ICD-10-CM | POA: Diagnosis not present

## 2017-01-27 DIAGNOSIS — F332 Major depressive disorder, recurrent severe without psychotic features: Secondary | ICD-10-CM

## 2017-01-27 DIAGNOSIS — R0789 Other chest pain: Secondary | ICD-10-CM

## 2017-01-27 MED ORDER — ALBUTEROL SULFATE HFA 108 (90 BASE) MCG/ACT IN AERS: 2.0000 | INHALATION_SPRAY | RESPIRATORY_TRACT | 2 refills | Status: DC | PRN

## 2017-01-27 NOTE — Progress Notes (Signed)
   Subjective:    Patient ID: Sheryl [name], female    DOB: October 31, 1955, 61 y.o.   MRN: [id number]  Patient presents for Medication Management (was taking Losartan/ HCTZ and noted HA on side of head, shoulder pain- states that she stopped takng and pain has resloved) Patient here to follow-up medications. Her last visit a month ago she was restarted on Lexapro for depression she had been lost to follow-up with last seen over a year ago. She is also out of her blood pressure medication therefore she was restarted on Hyzaar We discussed her morbid obesity is well her dietary concerns  She also complained of knee pain she had x-ray ordered as I had concern for degenerative joint disease with her morbid obesity but she never had this done.  Data there is been a lot of stress at home to [place] have any keep her son's children. Her sister came back it with her today states that she is stressed out keeping the 2 young children is somewhat for her to handle. She has not been taking the Lexapro regular basis but does notice a difference when she does. She did not start taking a blood pressure pill until last week she took for 2 days but states she had  headache and some chest discomfort therefore she stopped and states that the pain went away.    Review Of Systems:  GEN- denies fatigue, fever, weight loss,weakness, recent illness HEENT- denies eye drainage, change in vision, nasal discharge, CVS- denies chest pain, palpitations RESP- denies SOB, cough, wheeze ABD- denies N/V, change in stools, abd pain GU- denies dysuria, hematuria, dribbling, incontinence MSK- + joint pain, muscle aches, injury Neuro- denies headache, dizziness, syncope, seizure activity       Objective:    BP (!) 144/80   Pulse 88   Temp 98.4 F (36.9 C) (Oral)   Resp 14   Ht 5\' 2"  (1.575 m)   Wt 270 lb (122.5 kg)   LMP 12/20/2011   SpO2 98%   BMI 49.38 kg/m  GEN- NAD, alert and oriented x3,obese CVS- RRR, no  murmur RESP-CTAB EXT- No edema Pulses- Radial 2+   EKG- NSR, no ST abnormality , flat t wave V4     Assessment & Plan:    Give orders again for her knee xray   Problem List Items Addressed This Visit    MDD (major depressive disorder)    Depression is still a major issue for her. She does have support from her sister who is started company her to visits. She is to take the Lexapro at bedtime her sister states that she's is going to help dispense medication      Essential hypertension, benign - Primary    Uncontrolled, ? Side effects that she was having. Her EKG is unremarkable. We'll have her try a half a tablet of the blood pressure pill morning she still has headache and we'll just put her on HCTZ 25 mg once a day.      Relevant Orders   EKG 12-Lead (Completed)    Other Visit Diagnoses    Atypical chest pain       Relevant Orders   EKG 12-Lead (Completed)      Note: This dictation was prepared with Dragon dictation along with smaller phrase technology. Any transcriptional errors that result from this process are unintentional.

## 2017-01-27 NOTE — Patient Instructions (Addendum)
Take 1/2 tablet of the blood pressure in the morning Take lexapro at bedtime  Get the xray done  F/U 6 weeks

## 2017-01-27 NOTE — Assessment & Plan Note (Signed)
Depression is still a major issue for her. She does have support from her sister who is started company her to visits. She is to take the Lexapro at bedtime her sister states that she's is going to help dispense medication

## 2017-01-27 NOTE — Assessment & Plan Note (Signed)
Uncontrolled, ? Side effects that she was having. Her EKG is unremarkable. We'll have her try a half a tablet of the blood pressure pill morning she still has headache and we'll just put her on HCTZ 25 mg once a day.

## 2017-01-28 ENCOUNTER — Other Ambulatory Visit: Payer: Self-pay | Admitting: *Deleted

## 2017-01-28 MED ORDER — ALBUTEROL SULFATE HFA 108 (90 BASE) MCG/ACT IN AERS: 2.0000 | INHALATION_SPRAY | RESPIRATORY_TRACT | 11 refills | Status: DC | PRN

## 2017-02-24 ENCOUNTER — Encounter: Payer: Self-pay | Admitting: Family Medicine

## 2017-03-10 ENCOUNTER — Ambulatory Visit: Payer: Medicare Other | Admitting: Family Medicine

## 2017-05-07 ENCOUNTER — Encounter: Payer: Self-pay | Admitting: Family Medicine

## 2017-10-16 ENCOUNTER — Other Ambulatory Visit: Payer: Self-pay | Admitting: Family Medicine

## 2017-10-16 DIAGNOSIS — Z1231 Encounter for screening mammogram for malignant neoplasm of breast: Secondary | ICD-10-CM

## 2017-11-11 ENCOUNTER — Ambulatory Visit: Payer: Medicare Other

## 2018-01-22 ENCOUNTER — Other Ambulatory Visit: Payer: Self-pay | Admitting: Family Medicine

## 2018-01-30 ENCOUNTER — Ambulatory Visit: Payer: Medicare Other | Admitting: Family Medicine

## 2018-02-24 ENCOUNTER — Ambulatory Visit (INDEPENDENT_AMBULATORY_CARE_PROVIDER_SITE_OTHER): Payer: Medicare Other | Admitting: Family Medicine

## 2018-02-24 ENCOUNTER — Other Ambulatory Visit: Payer: Self-pay

## 2018-02-24 ENCOUNTER — Encounter: Payer: Self-pay | Admitting: Family Medicine

## 2018-02-24 VITALS — BP 140/78 | HR 82 | Temp 98.1°F | Resp 16 | Ht 62.0 in | Wt 264.0 lb

## 2018-02-24 DIAGNOSIS — I10 Essential (primary) hypertension: Secondary | ICD-10-CM

## 2018-02-24 DIAGNOSIS — N898 Other specified noninflammatory disorders of vagina: Secondary | ICD-10-CM | POA: Diagnosis not present

## 2018-02-24 DIAGNOSIS — B372 Candidiasis of skin and nail: Secondary | ICD-10-CM | POA: Diagnosis not present

## 2018-02-24 DIAGNOSIS — R300 Dysuria: Secondary | ICD-10-CM | POA: Diagnosis not present

## 2018-02-24 DIAGNOSIS — N390 Urinary tract infection, site not specified: Secondary | ICD-10-CM

## 2018-02-24 DIAGNOSIS — F332 Major depressive disorder, recurrent severe without psychotic features: Secondary | ICD-10-CM | POA: Diagnosis not present

## 2018-02-24 LAB — URINALYSIS, ROUTINE W REFLEX MICROSCOPIC
BILIRUBIN URINE: NEGATIVE
GLUCOSE, UA: NEGATIVE
Ketones, ur: NEGATIVE
Nitrite: POSITIVE — AB
Specific Gravity, Urine: 1.02 (ref 1.001–1.03)
pH: 7 (ref 5.0–8.0)

## 2018-02-24 LAB — CBC WITH DIFFERENTIAL/PLATELET
BASOS ABS: 17 {cells}/uL (ref 0–200)
Basophils Relative: 0.3 %
EOS PCT: 0.7 %
Eosinophils Absolute: 40 cells/uL (ref 15–500)
HCT: 39.4 % (ref 35.0–45.0)
Hemoglobin: 13.1 g/dL (ref 11.7–15.5)
Lymphs Abs: 1528 cells/uL (ref 850–3900)
MCH: 28.1 pg (ref 27.0–33.0)
MCHC: 33.2 g/dL (ref 32.0–36.0)
MCV: 84.5 fL (ref 80.0–100.0)
MONOS PCT: 5.9 %
MPV: 10.1 fL (ref 7.5–12.5)
NEUTROS PCT: 66.3 %
Neutro Abs: 3779 cells/uL (ref 1500–7800)
PLATELETS: 255 10*3/uL (ref 140–400)
RBC: 4.66 10*6/uL (ref 3.80–5.10)
RDW: 12.7 % (ref 11.0–15.0)
TOTAL LYMPHOCYTE: 26.8 %
WBC mixed population: 336 cells/uL (ref 200–950)
WBC: 5.7 10*3/uL (ref 3.8–10.8)

## 2018-02-24 LAB — COMPREHENSIVE METABOLIC PANEL
AG Ratio: 1.3 (calc) (ref 1.0–2.5)
ALT: 13 U/L (ref 6–29)
AST: 18 U/L (ref 10–35)
Albumin: 3.8 g/dL (ref 3.6–5.1)
Alkaline phosphatase (APISO): 95 U/L (ref 33–130)
BILIRUBIN TOTAL: 0.3 mg/dL (ref 0.2–1.2)
BUN / CREAT RATIO: 11 (calc) (ref 6–22)
BUN: 11 mg/dL (ref 7–25)
CALCIUM: 9.1 mg/dL (ref 8.6–10.4)
CO2: 29 mmol/L (ref 20–32)
Chloride: 105 mmol/L (ref 98–110)
Creat: 1.02 mg/dL — ABNORMAL HIGH (ref 0.50–0.99)
GLUCOSE: 99 mg/dL (ref 65–99)
Globulin: 2.9 g/dL (calc) (ref 1.9–3.7)
Potassium: 4.3 mmol/L (ref 3.5–5.3)
SODIUM: 141 mmol/L (ref 135–146)
TOTAL PROTEIN: 6.7 g/dL (ref 6.1–8.1)

## 2018-02-24 LAB — WET PREP FOR TRICH, YEAST, CLUE

## 2018-02-24 LAB — MICROSCOPIC MESSAGE

## 2018-02-24 MED ORDER — ESCITALOPRAM OXALATE 10 MG PO TABS: ORAL_TABLET | ORAL | 2 refills | Status: DC

## 2018-02-24 MED ORDER — ALBUTEROL SULFATE HFA 108 (90 BASE) MCG/ACT IN AERS: 2.0000 | INHALATION_SPRAY | RESPIRATORY_TRACT | 11 refills | Status: DC | PRN

## 2018-02-24 MED ORDER — CEPHALEXIN 500 MG PO CAPS
500.0000 mg | ORAL_CAPSULE | Freq: Four times a day (QID) | ORAL | 0 refills | Status: DC
Start: 2018-02-24 — End: 2018-05-06

## 2018-02-24 MED ORDER — HYDROCHLOROTHIAZIDE 25 MG PO TABS: 25.0000 mg | ORAL_TABLET | Freq: Every day | ORAL | 3 refills | Status: DC

## 2018-02-24 MED ORDER — FLUCONAZOLE 150 MG PO TABS: 150.0000 mg | ORAL_TABLET | Freq: Once | ORAL | 0 refills | Status: AC

## 2018-02-24 MED ORDER — CLOTRIMAZOLE-BETAMETHASONE 1-0.05 % EX CREA: 1.0000 "application " | TOPICAL_CREAM | Freq: Two times a day (BID) | CUTANEOUS | 0 refills | Status: DC

## 2018-02-24 NOTE — Patient Instructions (Addendum)
Take the HCTZ once a day  Take lexapro every day  Take antibiotics as prescribed F/U 2 months for PHYSICAL

## 2018-02-24 NOTE — Progress Notes (Signed)
   Subjective:    Patient ID: Sheryl [name], female    DOB: June 21, 1956, 62 y.o.   MRN: [id number]  Patient presents for Follow-up (is not fasting); Dysuria (painful urination); and Vaginal Discharge (white discharge noted to panties)   [place]sy[place] for past few weeks, no fever, no vomiging, also had vaginal discharge and a little blood- but had scratached herself and used chlorox to skin   HTN- was on HCTZ   Depression- was on lexapro , states was taking as needed, but needs it every day   Needs refill on inhaler             Review Of Systems:  GEN- denies fatigue, fever, weight loss,weakness, recent illness HEENT- denies eye drainage, change in vision, nasal discharge, CVS- denies chest pain, palpitations RESP- denies SOB, cough, wheeze ABD- denies N/V, change in stools, abd pain GU- denies dysuria, hematuria, dribbling, incontinence MSK- denies joint pain, muscle aches, injury Neuro- denies headache, dizziness, syncope, seizure activity       Objective:    BP 140/78   Pulse 82   Temp 98.1 F (36.7 C) (Oral)   Resp 16   Ht 5\' 2"  (1.575 m)   Wt 264 lb (119.7 kg)   LMP 12/20/2011   SpO2 98%   BMI 48.29 kg/m  GEN- NAD, alert and oriented x3 HEENT- PERRL, EOMI, non injected sclera, pink conjunctiva, MMM, oropharynx clear CVS- RRR, no murmur RESP-CTAB ABD-NABS,soft,NT,ND Nodes- no axillary nodes GU- normal external genitalia,  hypogpigmentation between labia and at introitus, moisture hyperpigmentation in groin creases, vaginal mucosa pink and moist, some atrophy cervix visualized no growth, no blood form os,no CMT, no ovarian masses, uterus normal size Psych- normal affect and mood EXT- No edema Pulses- Radial, DP- 2+        Assessment & Plan:      Problem List Items Addressed This Visit      Unprioritized   Morbid obesity ([place])   MDD (major depressive disorder)    Continue lexapro      Relevant Medications   escitalopram (LEXAPRO) 10 MG tablet   Essential hypertension, benign    HCTZ once a day for BP      Relevant Medications   hydrochlorothiazide (HYDRODIURIL) 25 MG tablet    Other Visit Diagnoses    Urinary tract infection without hematuria, site unspecified    -  Primary   Treat with keflex   Relevant Medications   cephALEXin (KEFLEX) 500 MG capsule   clotrimazole-betamethasone (LOTRISONE) cream   Other Relevant Orders   Urinalysis, Routine w reflex microscopic (Completed)   Intertriginous candidiasis       topical lotrisone appears to have some lichenous as well   Relevant Medications   cephALEXin (KEFLEX) 500 MG capsule   clotrimazole-betamethasone (LOTRISONE) cream   Other Relevant Orders   WET PREP FOR [place], YEAST, CLUE (Completed)   Essential hypertension       Relevant Medications   hydrochlorothiazide (HYDRODIURIL) 25 MG tablet   Other Relevant Orders   CBC with Differential/Platelet (Completed)   Comprehensive metabolic panel (Completed)      Note: This dictation was prepared with Dragon dictation along with smaller phrase technology. Any transcriptional errors that result from this process are unintentional.

## 2018-02-25 ENCOUNTER — Encounter: Payer: Self-pay | Admitting: Family Medicine

## 2018-02-25 NOTE — Assessment & Plan Note (Signed)
HCTZ once a day for BP

## 2018-02-25 NOTE — Assessment & Plan Note (Signed)
Continue lexapro  ?

## 2018-02-26 ENCOUNTER — Encounter: Payer: Self-pay | Admitting: *Deleted

## 2018-03-18 ENCOUNTER — Emergency Department (HOSPITAL_COMMUNITY)
Admission: EM | Admit: 2018-03-18 | Discharge: 2018-03-19 | Disposition: A | Discharge status: Home / Self Care | Payer: Medicare Other | Attending: Emergency Medicine | Admitting: Emergency Medicine

## 2018-03-18 ENCOUNTER — Other Ambulatory Visit: Payer: Self-pay

## 2018-03-18 ENCOUNTER — Encounter (HOSPITAL_COMMUNITY): Payer: Self-pay

## 2018-03-18 DIAGNOSIS — R252 Cramp and spasm: Secondary | ICD-10-CM | POA: Insufficient documentation

## 2018-03-18 DIAGNOSIS — I10 Essential (primary) hypertension: Secondary | ICD-10-CM | POA: Diagnosis not present

## 2018-03-18 DIAGNOSIS — R202 Paresthesia of skin: Secondary | ICD-10-CM | POA: Diagnosis not present

## 2018-03-18 DIAGNOSIS — Z79899 Other long term (current) drug therapy: Secondary | ICD-10-CM | POA: Insufficient documentation

## 2018-03-18 DIAGNOSIS — M79672 Pain in left foot: Secondary | ICD-10-CM | POA: Diagnosis not present

## 2018-03-18 NOTE — ED Triage Notes (Addendum)
Left foot pain, cramped up.  My right leg started tingling per pt.  My MD put me back on my HCTZ per pt, thought it may be from the medication.  The left side of my face started feeling numb. Just did not feel right per pt.  Patient was ambulatory to the Triage office.  Awake, alert, and oriented x 3.  No facial droop or slurred speech noted. States that she is having unilateral weakness with left < right side.

## 2018-03-19 ENCOUNTER — Emergency Department (HOSPITAL_COMMUNITY): Payer: Medicare Other

## 2018-03-19 LAB — CBC WITH DIFFERENTIAL/PLATELET
Basophils Absolute: 0 10*3/uL (ref 0.0–0.1)
Basophils Relative: 0 %
EOS PCT: 1 %
Eosinophils Absolute: 0.1 10*3/uL (ref 0.0–0.7)
HCT: 41.1 % (ref 36.0–46.0)
Hemoglobin: 13.2 g/dL (ref 12.0–15.0)
LYMPHS ABS: 2.4 10*3/uL (ref 0.7–4.0)
LYMPHS PCT: 34 %
MCH: 28.6 pg (ref 26.0–34.0)
MCHC: 32.1 g/dL (ref 30.0–36.0)
MCV: 89 fL (ref 78.0–100.0)
MONO ABS: 0.4 10*3/uL (ref 0.1–1.0)
MONOS PCT: 5 %
Neutro Abs: 4.3 10*3/uL (ref 1.7–7.7)
Neutrophils Relative %: 60 %
Platelets: 228 10*3/uL (ref 150–400)
RBC: 4.62 MIL/uL (ref 3.87–5.11)
RDW: 13.1 % (ref 11.5–15.5)
WBC: 7.1 10*3/uL (ref 4.0–10.5)

## 2018-03-19 LAB — BASIC METABOLIC PANEL
Anion gap: 10 (ref 5–15)
BUN: 14 mg/dL (ref 6–20)
CHLORIDE: 103 mmol/L (ref 101–111)
CO2: 28 mmol/L (ref 22–32)
Calcium: 9.2 mg/dL (ref 8.9–10.3)
Creatinine, Ser: 1.05 mg/dL — ABNORMAL HIGH (ref 0.44–1.00)
GFR calc Af Amer: >60 mL/min (ref >60)
GFR calc non Af Amer: 57 mL/min — ABNORMAL LOW (ref >60)
GLUCOSE: 110 mg/dL — AB (ref 65–99)
POTASSIUM: 3.4 mmol/L — AB (ref 3.5–5.1)
Sodium: 141 mmol/L (ref 135–145)

## 2018-03-19 NOTE — Discharge Instructions (Addendum)
Continue medications as previously prescribed.  Up with your primary doctor if symptoms are not improving in the next 3 to 4 days, and return to the ER if symptoms significantly worsen or change.

## 2018-03-19 NOTE — ED Provider Notes (Signed)
[facility] EMERGENCY DEPARTMENT Provider Note   CSN: [id number] Arrival date & time: 03/18/18  2151     History   Chief Complaint Chief Complaint  Patient presents with  . Numbness    HPI ARAMINTA ZORN is a 62 y.o. female.  This patient is a 62 year old female with past medical history of obesity, hypertension, depression.  She presents for evaluation of left foot pain and numbness.  She states that this started earlier today while walking.  She states she developed a cramp in her foot and her foot curled up making it difficult for her to ambulate.  She experienced some numbness in her left hand as the day drug on.  She denies any visual disturbances.  She denies any weakness.  The history is provided by the patient.    Past Medical History:  Diagnosis Date  . Chronic back pain 2013  . Chronic neck pain 2013  . Depression   . Hypertension     Patient Active Problem List   Diagnosis Date Noted  . Bullous impetigo 07/26/2015  . OAB (overactive bladder) 07/25/2015  . Stress incontinence 07/25/2015  . Rotator cuff syndrome of left shoulder 12/19/2014  . Difficulty in walking(719.7) 05/06/2012  . Stiffness of vertebral column 05/06/2012  . Decreased strength 05/06/2012  . Morbid obesity ([place]) 04/16/2012  . MDD (major depressive disorder) 04/16/2012  . Essential hypertension, benign 04/16/2012  . Back pain 04/16/2012  . Cervical strain, acute 04/16/2012    Past Surgical History:  Procedure Laterality Date  . BREAST CYST EXCISION     right  . TUBAL LIGATION       OB History   None      Home Medications    Prior to Admission medications   Medication Sig Start Date End Date Taking? Authorizing Provider  albuterol (PROVENTIL HFA;VENTOLIN HFA) 108 (90 Base) MCG/ACT inhaler Inhale 2 puffs into the lungs every 4 (four) hours as needed for wheezing or shortness of breath. 02/24/18  Yes [place], Modena Nunnery, MD  cephALEXin (KEFLEX) 500 MG capsule Take 1 capsule (500  mg total) by mouth 4 (four) times daily. Patient taking differently: Take 500 mg by mouth once.  02/24/18  Yes [place], Modena Nunnery, MD  clotrimazole-betamethasone (LOTRISONE) cream Apply 1 application topically 2 (two) times daily. 02/24/18  Yes [place], Modena Nunnery, MD  escitalopram (LEXAPRO) 10 MG tablet TAKE 1 TABLET(10 MG) BY MOUTH DAILY 02/24/18  Yes [place], Modena Nunnery, MD  hydrochlorothiazide (HYDRODIURIL) 25 MG tablet Take 1 tablet (25 mg total) by mouth daily. 02/24/18  Yes [place], Modena Nunnery, MD    Family History Family History  Problem Relation Age of Onset  . Hypertension Mother   . Depression Mother   . Hypertension Sister   . Hypertension Sister     Social History Social History   Tobacco Use  . Smoking status: Never Smoker  . Smokeless tobacco: Never Used  Substance Use Topics  . Alcohol use: No    Alcohol/week: 0.0 oz  . Drug use: No     Allergies   Patient has no known allergies.   Review of Systems Review of Systems  All other systems reviewed and are negative.    Physical Exam Updated Vital Signs BP 132/70 (BP Location: Left Arm)   Pulse 63   Temp 98.6 F (37 C) (Oral)   Resp 16   Ht 5\' 2"  (1.575 m)   Wt 117.9 kg (260 lb)   LMP 12/20/2011   SpO2 97%  BMI 47.55 kg/m   Physical Exam  Constitutional: She is oriented to person, place, and time. She appears well-developed and well-nourished. No distress.  HENT:  Head: Normocephalic and atraumatic.  Eyes: Pupils are equal, round, and reactive to light. EOM are normal.  Neck: Normal range of motion. Neck supple.  Cardiovascular: Normal rate and regular rhythm. Exam reveals no gallop and no friction rub.  No murmur heard. Pulmonary/Chest: Effort normal and breath sounds normal. No respiratory distress. She has no wheezes.  Abdominal: Soft. Bowel sounds are normal. She exhibits no distension. There is no tenderness.  Musculoskeletal: Normal range of motion.  Neurological: She is alert and oriented to person,  place, and time. No cranial nerve deficit. She exhibits normal muscle tone. Coordination normal.  Skin: Skin is warm and dry. She is not diaphoretic.  Nursing note and vitals reviewed.    ED Treatments / Results  Labs (all labs ordered are listed, but only abnormal results are displayed) Labs Reviewed  BASIC METABOLIC PANEL - Abnormal; Notable for the following components:      Result Value   Potassium 3.4 (*)    Glucose, Bld 110 (*)    Creatinine, Ser 1.05 (*)    GFR calc non Af Amer 57 (*)    All other components within normal limits  CBC WITH DIFFERENTIAL/PLATELET    EKG EKG Interpretation  Date/Time:  Thursday Mar 19 2018 00:28:08 EDT Ventricular Rate:  71 PR Interval:    QRS Duration: 84 QT Interval:  419 QTC Calculation: 456 R Axis:   36 Text Interpretation:  Sinus rhythm Probable left atrial enlargement Abnormal R-wave progression, early transition Confirmed by Veryl Speak [phone]) on 03/19/2018 1:13:03 AM   Radiology Ct Head Wo Contrast  Result Date: 03/19/2018 CLINICAL DATA:  Acute onset of left foot cramping and pain. Right leg tingling. Left-sided facial numbness. EXAM: CT HEAD WITHOUT CONTRAST TECHNIQUE: Contiguous axial images were obtained from the base of the skull through the vertex without intravenous contrast. COMPARISON:  None. FINDINGS: Brain: No evidence of acute infarction, hemorrhage, hydrocephalus, extra-axial collection or mass lesion/mass effect. The posterior fossa, including the cerebellum, brainstem and fourth ventricle, is within normal limits. The third and lateral ventricles, and basal ganglia are unremarkable in appearance. The cerebral hemispheres are symmetric in appearance, with normal gray-white differentiation. No mass effect or midline shift is seen. Vascular: No hyperdense vessel or unexpected calcification. Skull: There is no evidence of fracture; visualized osseous structures are unremarkable in appearance. Sinuses/Orbits: The orbits are  within normal limits. The paranasal sinuses and mastoid air cells are well-aerated. Other: No significant soft tissue abnormalities are seen. IMPRESSION: Unremarkable noncontrast CT of the head. Electronically Signed   By: Garald Balding M.D.   On: 03/19/2018 01:44    Procedures Procedures (including critical care time)  Medications Ordered in ED Medications - No data to display   Initial Impression / Assessment and Plan / ED Course  I have reviewed the triage vital signs and the nursing notes.  Pertinent labs & imaging results that were available during my care of the patient were reviewed by me and considered in my medical decision making (see chart for details).  Patient's presentation not consistent with a TIA or CVA.  She is describing a spasm in her foot that began while ambulating.  Her neurologic exam is nonfocal and work-up thus far is unremarkable.  I see no indication for additional work-up at this time.  She is to follow-up with her primary doctor  if not improving.  Final Clinical Impressions(s) / ED Diagnoses   Final diagnoses:  Paresthesia    ED Discharge Orders    None       Veryl Speak, MD 03/19/18 [phone]

## 2018-05-06 ENCOUNTER — Ambulatory Visit (INDEPENDENT_AMBULATORY_CARE_PROVIDER_SITE_OTHER): Payer: Medicare Other | Admitting: Family Medicine

## 2018-05-06 ENCOUNTER — Encounter: Payer: Self-pay | Admitting: Family Medicine

## 2018-05-06 ENCOUNTER — Encounter (INDEPENDENT_AMBULATORY_CARE_PROVIDER_SITE_OTHER): Payer: Self-pay | Admitting: *Deleted

## 2018-05-06 ENCOUNTER — Other Ambulatory Visit: Payer: Self-pay

## 2018-05-06 VITALS — BP 138/78 | HR 72 | Temp 98.3°F | Resp 14 | Ht 62.0 in | Wt 267.0 lb

## 2018-05-06 DIAGNOSIS — Z114 Encounter for screening for human immunodeficiency virus [HIV]: Secondary | ICD-10-CM

## 2018-05-06 DIAGNOSIS — Z0000 Encounter for general adult medical examination without abnormal findings: Secondary | ICD-10-CM | POA: Diagnosis not present

## 2018-05-06 DIAGNOSIS — F332 Major depressive disorder, recurrent severe without psychotic features: Secondary | ICD-10-CM | POA: Diagnosis not present

## 2018-05-06 DIAGNOSIS — Z1211 Encounter for screening for malignant neoplasm of colon: Secondary | ICD-10-CM | POA: Diagnosis not present

## 2018-05-06 DIAGNOSIS — Z1231 Encounter for screening mammogram for malignant neoplasm of breast: Secondary | ICD-10-CM

## 2018-05-06 DIAGNOSIS — Z1159 Encounter for screening for other viral diseases: Secondary | ICD-10-CM

## 2018-05-06 DIAGNOSIS — Z1239 Encounter for other screening for malignant neoplasm of breast: Secondary | ICD-10-CM

## 2018-05-06 DIAGNOSIS — Z124 Encounter for screening for malignant neoplasm of cervix: Secondary | ICD-10-CM

## 2018-05-06 MED ORDER — ZOSTER VAC RECOMB ADJUVANTED 50 MCG/0.5ML IM SUSR: 0.5000 mL | Freq: Once | INTRAMUSCULAR | 0 refills | Status: AC

## 2018-05-06 MED ORDER — CLOTRIMAZOLE-BETAMETHASONE 1-0.05 % EX CREA: 1.0000 "application " | TOPICAL_CREAM | Freq: Two times a day (BID) | CUTANEOUS | 3 refills | Status: DC

## 2018-05-06 NOTE — Progress Notes (Signed)
Subjective:   Patient presents for Medicare Annual/Subsequent preventive examination.     Pt here to for CPE  Medications reviewed    She was last here 2 months ago.   MDD- she has not been consistent with her lexapro, has not been able to exercise and do things around the house    Obesity- has gained 3lbs    Review Past Medical/Family/Social:Per EMR    Risk Factors  Current exercise habits: very little  Dietary issues discussed: Yes  Cardiac risk factors: Obesity (BMI >= 30 kg/m2). HTN  Depression Screen  (Note: if answer to either of the following is "Yes", a more complete depression screening is indicated)  Over the past two weeks, have you felt down, depressed or hopeless? No Over the past two weeks, have you felt little interest or pleasure in doing things? No Have you lost interest or pleasure in daily life? No Do you often feel hopeless? No Do you cry easily over simple problems? No   Activities of Daily Living  In your present state of health, do you have any difficulty performing the following activities?:  Driving? No  Managing money? No  Feeding yourself? No  Getting from bed to chair? No  Climbing a flight of stairs? No  Preparing food and eating?: No  Bathing or showering? No  Getting dressed: No  Getting to the toilet? No  Using the toilet:No  Moving around from place to place: No  In the past year have you fallen or had a near fall?:No  Are you sexually active? No  Do you have more than one partner? No   Hearing Difficulties: No  Do you often ask people to speak up or repeat themselves? No  Do you experience ringing or noises in your ears? No Do you have difficulty understanding soft or whispered voices? No  Do you feel that you have a problem with memory? No Do you often misplace items? No  Do you feel safe at home? Yes  Cognitive Testing  Alert? Yes Normal Appearance?Yes  Oriented to person? Yes Place? Yes  Time? Yes  Recall of three  objects? Yes  Can perform simple calculations? Yes  Displays appropriate judgment?Yes  Can read the correct time from a watch face?Yes   List the Names of Other Physician/Practitioners you currently use:     Screening Tests / Date Colonoscopy    Due                  Shingles vaccine- Due  Mammogram  Due  PAP Smear- due, last done 2016 was normal  Influenza Vaccine  Tetanus/tdap UTD   ROS: GEN- denies fatigue, fever, weight loss,weakness, recent illness HEENT- denies eye drainage, change in vision, nasal discharge, CVS- denies chest pain, palpitations RESP- denies SOB, cough, wheeze ABD- denies N/V, change in stools, abd pain GU- denies dysuria, hematuria, dribbling, incontinence MSK- denies joint pain, muscle aches, injury Neuro- denies headache, dizziness, syncope, seizure activity  Physical: GEN- NAD, alert and oriented x3 HEENT- PERRL, EOMI, non injected sclera, pink conjunctiva, MMM, oropharynx clear Neck- Supple, no thryomegaly Breast- normal symmetry, no nipple inversion,no nipple drainage, no nodules or lumps felt Nodes- no axillary nodes CVS- RRR, no murmur RESP-CTAB ABD-NABS,soft, NT,ND GU- normal external genitalia, vaginal mucosa pink and moist, cervix visualized no growth, no blood form os, minimal thin clear discharge, no CMT, no ovarian masses, uterus normal size EXT- No edema Pulses- Radial, DP- 2+    Assessment:    Annual  wellness medicare exam   Plan:    During the course of the visit the patient was educated and counseled about appropriate screening and preventive services including:  Screening mammography - pt to schedule Colorectal cancer screening - referral placed Shingles vaccine. Prescription given to that she can get the vaccine at the pharmacy or Medicare part D.  Fasting labs done  CAGE- Negative   Depression- reiterated daily use of lexapro, pHQ score 10  HEP C/HIV screening done PAP Smear doen, if neg repeat in 5 years    Counseling on diet/weight loss need        Diet review for nutrition referral? Yes ____ Not Indicated __x__  Patient Instructions (the written plan) was given to the patient.  Medicare Attestation  I have personally reviewed:  The patient's medical and social history  Their use of alcohol, tobacco or illicit drugs  Their current medications and supplements  The patient's functional ability including ADLs,fall risks, home safety risks, cognitive, and hearing and visual impairment  Diet and physical activities  Evidence for depression or mood disorders  The patient's weight, height, BMI, and visual acuity have been recorded in the chart. I have made referrals, counseling, and provided education to the patient based on review of the above and I have provided the patient with a written personalized care plan for preventive services.

## 2018-05-06 NOTE — Patient Instructions (Addendum)
Referral for colonoscopy  Schedule your mammogram  Shingles vaccine sent to pharmacy - ask about cost  F/U 6 months

## 2018-05-07 LAB — COMPREHENSIVE METABOLIC PANEL
AG RATIO: 1.1 (calc) (ref 1.0–2.5)
ALT: 15 U/L (ref 6–29)
AST: 21 U/L (ref 10–35)
Albumin: 3.7 g/dL (ref 3.6–5.1)
Alkaline phosphatase (APISO): 100 U/L (ref 33–130)
BILIRUBIN TOTAL: 0.4 mg/dL (ref 0.2–1.2)
BUN / CREAT RATIO: 12 (calc) (ref 6–22)
BUN: 13 mg/dL (ref 7–25)
CALCIUM: 9.3 mg/dL (ref 8.6–10.4)
CHLORIDE: 103 mmol/L (ref 98–110)
CO2: 27 mmol/L (ref 20–32)
Creat: 1.11 mg/dL — ABNORMAL HIGH (ref 0.50–0.99)
GLOBULIN: 3.3 g/dL (ref 1.9–3.7)
GLUCOSE: 103 mg/dL — AB (ref 65–99)
Potassium: 3.8 mmol/L (ref 3.5–5.3)
SODIUM: 142 mmol/L (ref 135–146)
TOTAL PROTEIN: 7 g/dL (ref 6.1–8.1)

## 2018-05-07 LAB — LIPID PANEL
Cholesterol: 172 mg/dL (ref <200)
HDL: 37 mg/dL — ABNORMAL LOW (ref >50)
LDL CHOLESTEROL (CALC): 117 mg/dL — AB
NON-HDL CHOLESTEROL (CALC): 135 mg/dL — AB (ref <130)
TRIGLYCERIDES: 84 mg/dL (ref <150)
Total CHOL/HDL Ratio: 4.6 (calc) (ref <5.0)

## 2018-05-07 LAB — PAP THINPREP ASCUS RFLX HPV RFLX TYPE
C. trachomatis RNA, TMA: NOT DETECTED
N. gonorrhoeae RNA, TMA: NOT DETECTED

## 2018-05-07 LAB — HEPATITIS C ANTIBODY
Hepatitis C Ab: NONREACTIVE
SIGNAL TO CUT-OFF: 0.02 (ref <1.00)

## 2018-05-07 LAB — EXTRA LAV TOP TUBE

## 2018-05-07 LAB — HIV ANTIBODY (ROUTINE TESTING W REFLEX): HIV 1&2 Ab, 4th Generation: NONREACTIVE

## 2018-05-08 ENCOUNTER — Encounter: Payer: Self-pay | Admitting: Family Medicine

## 2018-05-12 ENCOUNTER — Encounter: Payer: Self-pay | Admitting: *Deleted

## 2018-05-14 ENCOUNTER — Ambulatory Visit (HOSPITAL_COMMUNITY): Payer: Medicare Other

## 2018-06-14 ENCOUNTER — Emergency Department (HOSPITAL_COMMUNITY)
Admission: EM | Admit: 2018-06-14 | Discharge: 2018-06-14 | Disposition: A | Discharge status: Home / Self Care | Payer: Medicare Other | Attending: Emergency Medicine | Admitting: Emergency Medicine

## 2018-06-14 ENCOUNTER — Other Ambulatory Visit: Payer: Self-pay

## 2018-06-14 ENCOUNTER — Encounter (HOSPITAL_COMMUNITY): Payer: Self-pay | Admitting: Emergency Medicine

## 2018-06-14 DIAGNOSIS — J019 Acute sinusitis, unspecified: Secondary | ICD-10-CM | POA: Diagnosis not present

## 2018-06-14 DIAGNOSIS — J029 Acute pharyngitis, unspecified: Secondary | ICD-10-CM | POA: Diagnosis present

## 2018-06-14 DIAGNOSIS — Z79899 Other long term (current) drug therapy: Secondary | ICD-10-CM | POA: Diagnosis not present

## 2018-06-14 DIAGNOSIS — I10 Essential (primary) hypertension: Secondary | ICD-10-CM | POA: Diagnosis not present

## 2018-06-14 DIAGNOSIS — F329 Major depressive disorder, single episode, unspecified: Secondary | ICD-10-CM | POA: Diagnosis not present

## 2018-06-14 DIAGNOSIS — J329 Chronic sinusitis, unspecified: Secondary | ICD-10-CM

## 2018-06-14 MED ORDER — AMOXICILLIN 500 MG PO CAPS: 500.0000 mg | ORAL_CAPSULE | Freq: Three times a day (TID) | ORAL | 0 refills | Status: DC

## 2018-06-14 NOTE — ED Provider Notes (Signed)
[facility] EMERGENCY DEPARTMENT Provider Note   CSN: [id number] Arrival date & time: 06/14/18  1147     History   Chief Complaint Chief Complaint  Patient presents with  . Sore Throat    HPI Sheryl [name] is a 62 y.o. female.  The history is provided by the patient. No language interpreter was used.  Sore Throat  This is a new problem. The current episode started more than 2 days ago. The problem occurs constantly. The problem has been gradually worsening. Nothing aggravates the symptoms. Nothing relieves the symptoms. She has tried nothing for the symptoms. The treatment provided no relief.  Pt complains of sinus drainage and congestion.  Pt reports her ears and throat are sore.   Past Medical History:  Diagnosis Date  . Chronic back pain 2013  . Chronic neck pain 2013  . Depression   . Hypertension     Patient Active Problem List   Diagnosis Date Noted  . Bullous impetigo 07/26/2015  . OAB (overactive bladder) 07/25/2015  . Stress incontinence 07/25/2015  . Rotator cuff syndrome of left shoulder 12/19/2014  . Difficulty in walking(719.7) 05/06/2012  . Stiffness of vertebral column 05/06/2012  . Decreased strength 05/06/2012  . Morbid obesity ([place]) 04/16/2012  . MDD (major depressive disorder) 04/16/2012  . Essential hypertension, benign 04/16/2012  . Back pain 04/16/2012  . Cervical strain, acute 04/16/2012    Past Surgical History:  Procedure Laterality Date  . BREAST CYST EXCISION     right  . TUBAL LIGATION       OB History   None      Home Medications    Prior to Admission medications   Medication Sig Start Date End Date Taking? Authorizing Provider  albuterol (PROVENTIL HFA;VENTOLIN HFA) 108 (90 Base) MCG/ACT inhaler Inhale 2 puffs into the lungs every 4 (four) hours as needed for wheezing or shortness of breath. 02/24/18   Morley, Modena Nunnery, MD  clotrimazole-betamethasone (LOTRISONE) cream Apply 1 application topically 2 (two) times daily.  05/06/18   Alycia Rossetti, MD  escitalopram (LEXAPRO) 10 MG tablet TAKE 1 TABLET(10 MG) BY MOUTH DAILY 02/24/18   , Modena Nunnery, MD  hydrochlorothiazide (HYDRODIURIL) 25 MG tablet Take 1 tablet (25 mg total) by mouth daily. 02/24/18   Alycia Rossetti, MD    Family History Family History  Problem Relation Age of Onset  . Hypertension Mother   . Depression Mother   . Hypertension Sister   . Hypertension Sister     Social History Social History   Tobacco Use  . Smoking status: Never Smoker  . Smokeless tobacco: Never Used  Substance Use Topics  . Alcohol use: No    Alcohol/week: 0.0 standard drinks  . Drug use: No     Allergies   Patient has no known allergies.   Review of Systems Review of Systems  HENT: Positive for ear pain, sinus pain and sore throat.   Respiratory: Positive for cough.   All other systems reviewed and are negative.    Physical Exam Updated Vital Signs BP 125/72 (BP Location: Right Arm)   Pulse 72   Temp 98.4 F (36.9 C) (Temporal)   Resp 20   Ht 5' (1.524 m)   Wt 126.6 kg   LMP 12/20/2011   SpO2 99%   BMI 54.49 kg/m   Physical Exam  Constitutional: She appears well-developed and well-nourished.  HENT:  Head: Normocephalic.  Right Ear: Tympanic membrane normal.  Left Ear: Tympanic membrane  normal.  Mouth/Throat: Mucous membranes are normal. Posterior oropharyngeal erythema present.  Eyes: Pupils are equal, round, and reactive to light. EOM are normal.  Cardiovascular: Normal rate and regular rhythm.  Pulmonary/Chest: Effort normal.  Abdominal: Soft.  Neurological: She is alert.  Skin: Skin is warm.  Nursing note and vitals reviewed.    ED Treatments / Results  Labs (all labs ordered are listed, but only abnormal results are displayed) Labs Reviewed - No data to display  EKG None  Radiology No results found.  Procedures Procedures (including critical care time)  Medications Ordered in ED Medications - No data to  display   Initial Impression / Assessment and Plan / ED Course  I have reviewed the triage vital signs and the nursing notes.  Pertinent labs & imaging results that were available during my care of the patient were reviewed by me and considered in my medical decision making (see chart for details).     Pt advised to follow up with Dr. Buelah Manis for recheck in 1 week   Final Clinical Impressions(s) / ED Diagnoses   Final diagnoses:  Sinusitis, unspecified chronicity, unspecified location    ED Discharge Orders         Ordered    amoxicillin (AMOXIL) 500 MG capsule  3 times daily     06/14/18 1301        An After Visit Summary was printed and given to the patient.    Fransico Meadow, Hershal Coria 06/14/18 1301    Nat Christen, MD 06/15/18 [phone]

## 2018-06-14 NOTE — ED Triage Notes (Signed)
Pt reports sore throat, congestion, and ear pain for a week.

## 2018-06-14 NOTE — Discharge Instructions (Signed)
Return if any problems.

## 2018-06-19 ENCOUNTER — Ambulatory Visit (HOSPITAL_COMMUNITY)
Admission: RE | Admit: 2018-06-19 | Discharge: 2018-06-19 | Disposition: A | Discharge status: Home / Self Care | Payer: Medicare Other | Source: Ambulatory Visit | Attending: Family Medicine | Admitting: Family Medicine

## 2018-06-19 DIAGNOSIS — Z1231 Encounter for screening mammogram for malignant neoplasm of breast: Secondary | ICD-10-CM | POA: Insufficient documentation

## 2018-06-19 DIAGNOSIS — Z1239 Encounter for other screening for malignant neoplasm of breast: Secondary | ICD-10-CM

## 2018-09-01 IMAGING — MG DIGITAL SCREENING BILATERAL MAMMOGRAM WITH TOMO AND CAD
8 of 16 series · 8 of 40 positions shown · non-contrast
Comparison: Previous exam(s).

CLINICAL DATA: Screening.

EXAM:
DIGITAL SCREENING BILATERAL MAMMOGRAM WITH TOMO AND CAD

[L CC]
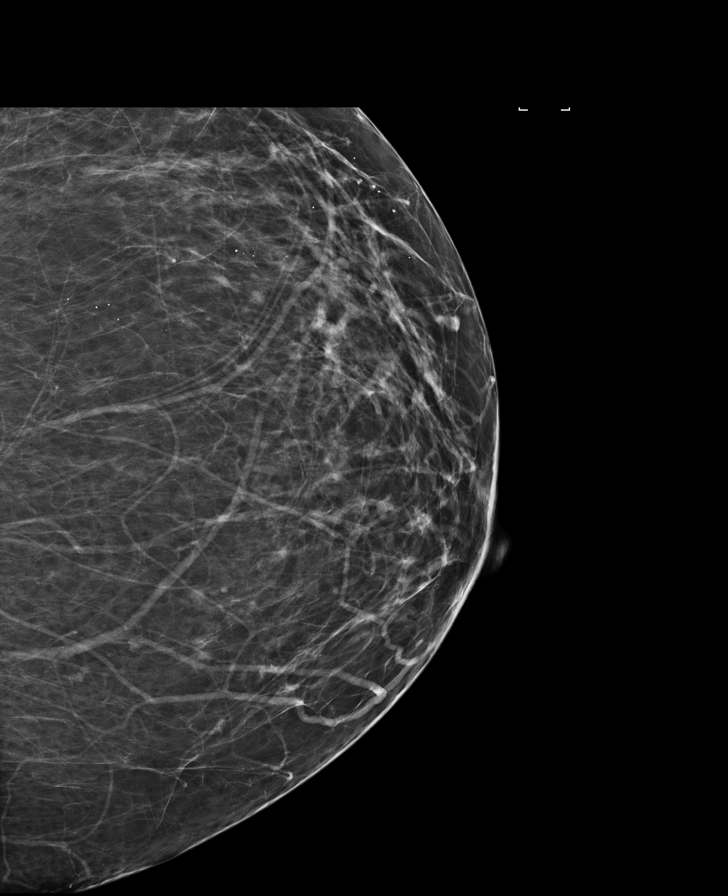

[L MLO synth-2D (1 of 2)]
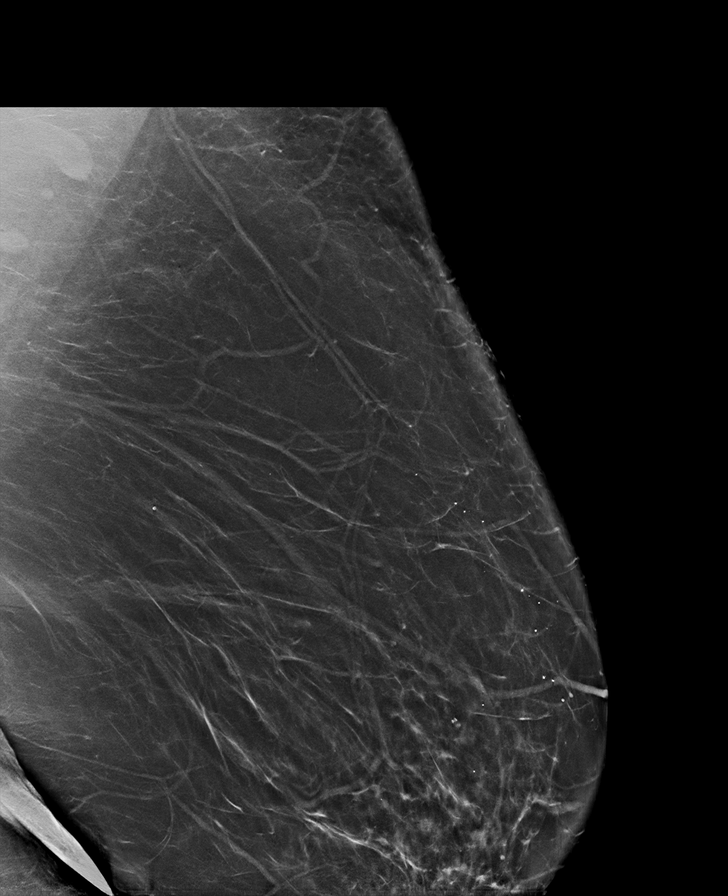

[L CC synth-2D]
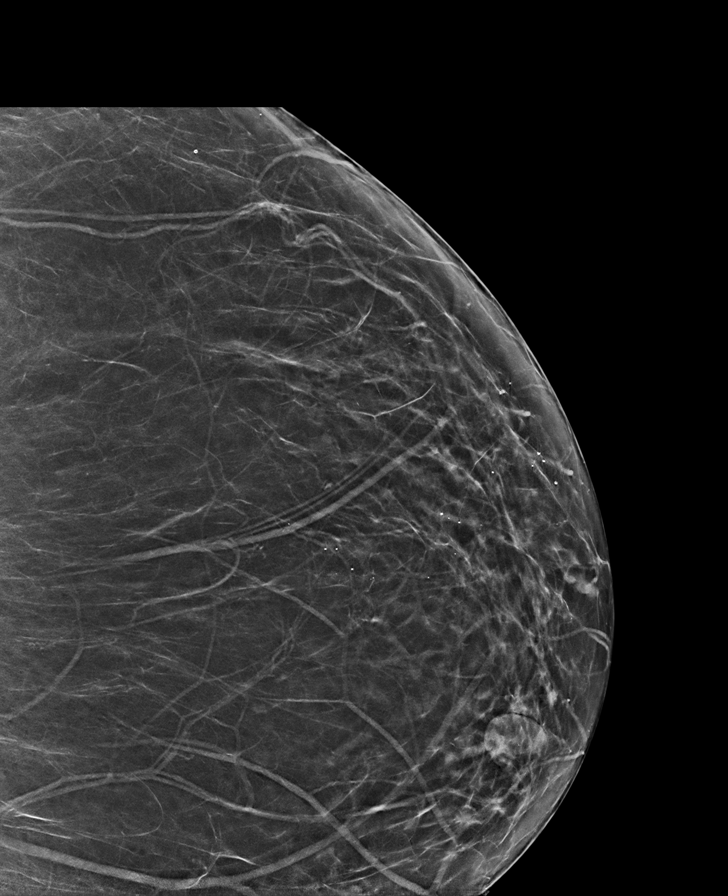

[L MLO synth-2D (2 of 2)]
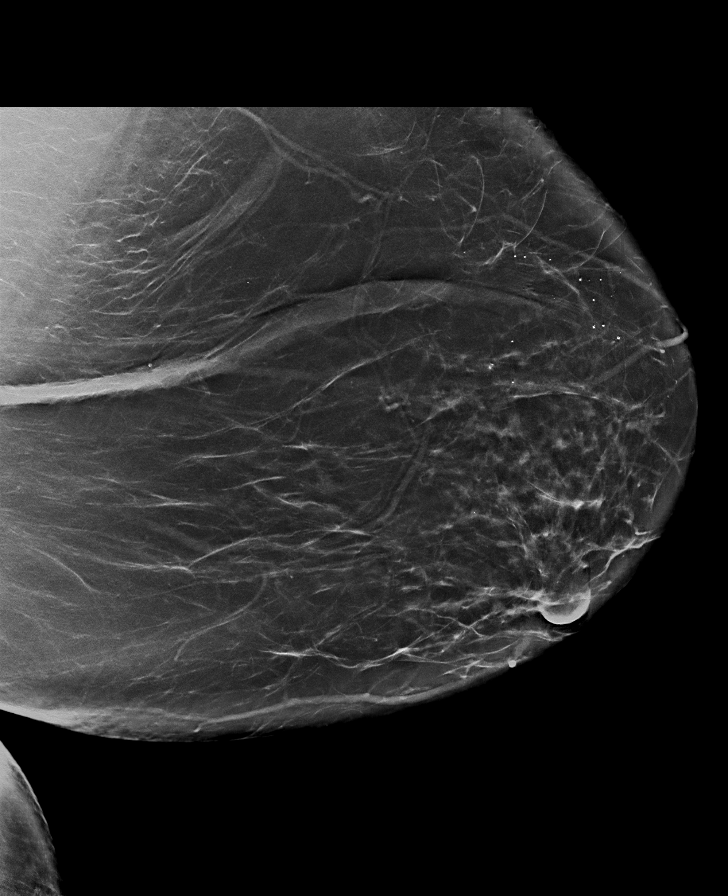

[R MLO synth-2D (1 of 2)]
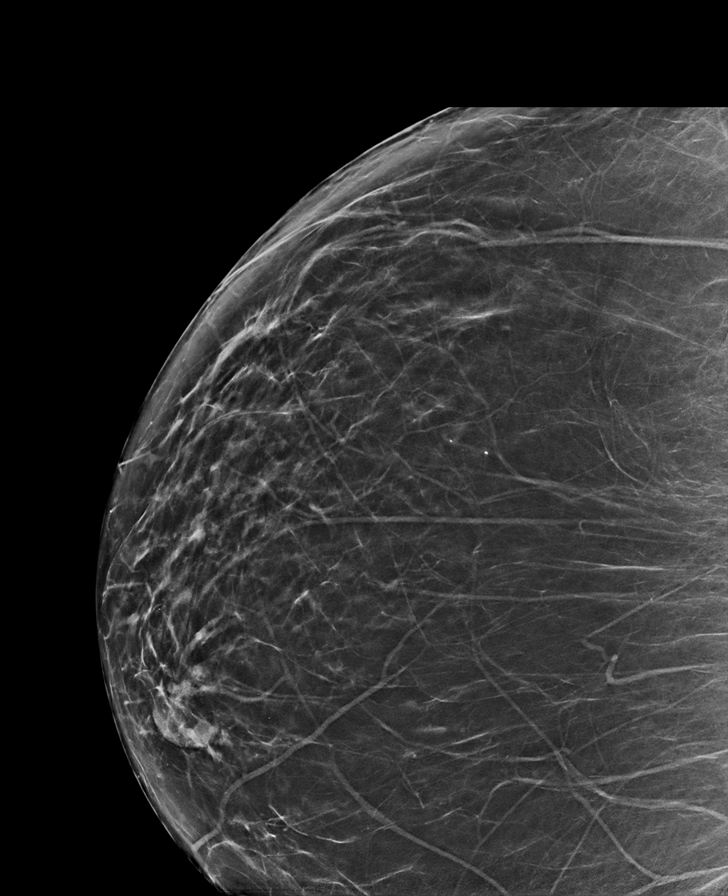

[R CC synth-2D]
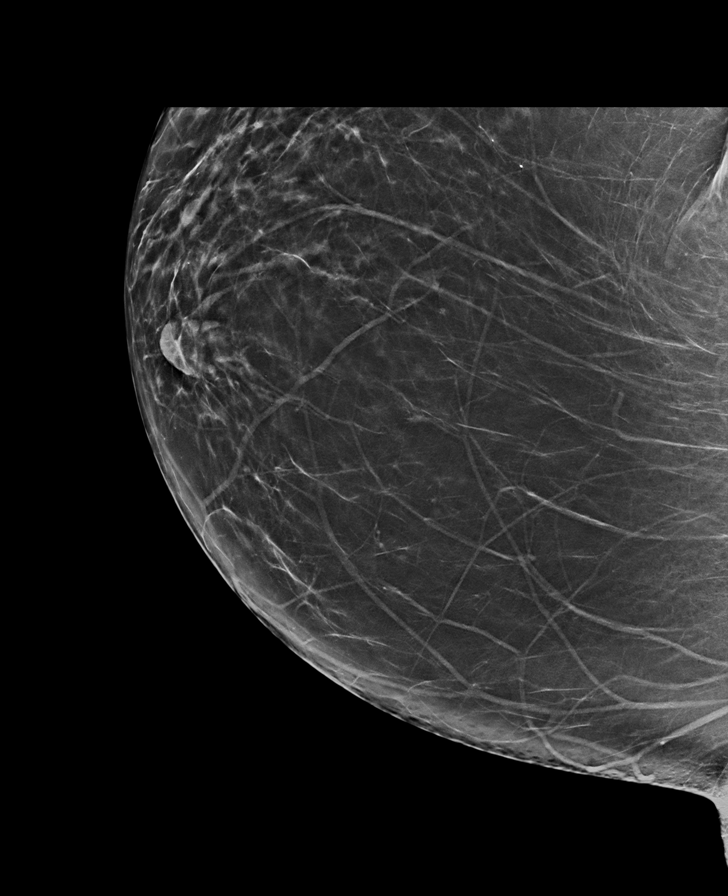

[R MLO synth-2D (2 of 2)]
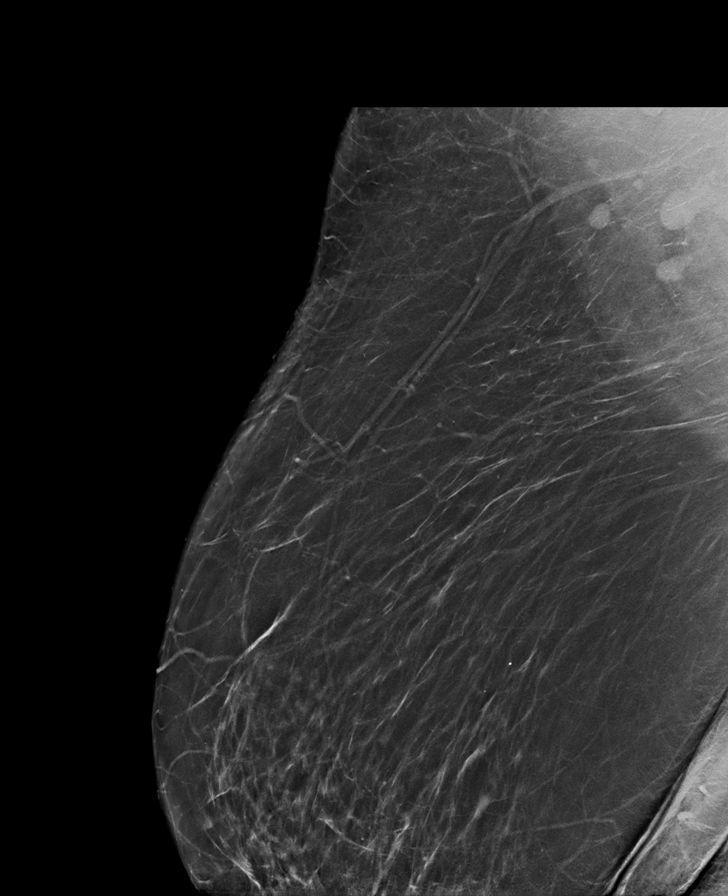

[L CC tomo · tomo slice 37/74.0]
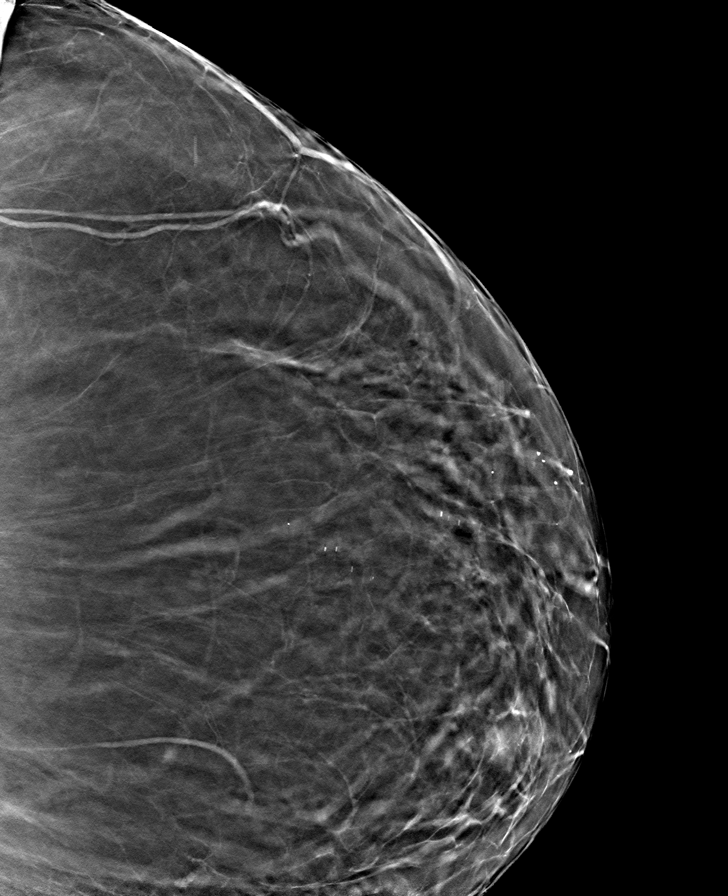

[8 of 40 positions shown; findings below may reference images not displayed]

ACR Breast Density Category b: There are scattered areas of
fibroglandular density.
FINDINGS: There are no findings suspicious for malignancy. Images were
processed with CAD.
IMPRESSION: No mammographic evidence of malignancy. A result letter of this
screening mammogram will be mailed directly to the patient.

RECOMMENDATION:
Screening mammogram in one year. (Code:[id number])

BI-RADS CATEGORY  1: Negative.

## 2019-01-01 ENCOUNTER — Other Ambulatory Visit: Payer: Self-pay | Admitting: Family Medicine

## 2019-04-27 ENCOUNTER — Other Ambulatory Visit: Payer: Self-pay | Admitting: Family Medicine

## 2019-04-28 ENCOUNTER — Other Ambulatory Visit: Payer: Self-pay | Admitting: Family Medicine

## 2019-06-29 ENCOUNTER — Other Ambulatory Visit (HOSPITAL_COMMUNITY): Payer: Self-pay | Admitting: Family Medicine

## 2019-06-29 DIAGNOSIS — Z1231 Encounter for screening mammogram for malignant neoplasm of breast: Secondary | ICD-10-CM

## 2019-07-07 ENCOUNTER — Other Ambulatory Visit: Payer: Self-pay

## 2019-07-07 ENCOUNTER — Ambulatory Visit (HOSPITAL_COMMUNITY)
Admission: RE | Admit: 2019-07-07 | Discharge: 2019-07-07 | Disposition: A | Discharge status: Home / Self Care | Payer: Medicare Other | Source: Ambulatory Visit | Attending: Family Medicine | Admitting: Family Medicine

## 2019-07-07 DIAGNOSIS — Z1231 Encounter for screening mammogram for malignant neoplasm of breast: Secondary | ICD-10-CM | POA: Insufficient documentation

## 2019-07-08 ENCOUNTER — Other Ambulatory Visit (HOSPITAL_COMMUNITY): Payer: Self-pay | Admitting: Family Medicine

## 2019-07-08 DIAGNOSIS — R928 Other abnormal and inconclusive findings on diagnostic imaging of breast: Secondary | ICD-10-CM

## 2019-07-13 ENCOUNTER — Other Ambulatory Visit: Payer: Self-pay

## 2019-07-13 ENCOUNTER — Ambulatory Visit (HOSPITAL_COMMUNITY)
Admission: RE | Admit: 2019-07-13 | Discharge: 2019-07-13 | Disposition: A | Discharge status: Home / Self Care | Payer: Medicare Other | Source: Ambulatory Visit | Attending: Family Medicine | Admitting: Family Medicine

## 2019-07-13 DIAGNOSIS — R928 Other abnormal and inconclusive findings on diagnostic imaging of breast: Secondary | ICD-10-CM | POA: Diagnosis not present

## 2019-07-13 DIAGNOSIS — N6012 Diffuse cystic mastopathy of left breast: Secondary | ICD-10-CM | POA: Diagnosis not present

## 2019-08-18 ENCOUNTER — Other Ambulatory Visit: Payer: Self-pay

## 2019-08-18 ENCOUNTER — Encounter: Payer: Self-pay | Admitting: Family Medicine

## 2019-08-18 ENCOUNTER — Ambulatory Visit (INDEPENDENT_AMBULATORY_CARE_PROVIDER_SITE_OTHER): Payer: Medicare Other | Admitting: Family Medicine

## 2019-08-18 VITALS — BP 134/72 | HR 66 | Temp 98.3°F | Resp 14 | Ht 62.0 in | Wt 273.0 lb

## 2019-08-18 DIAGNOSIS — E7439 Other disorders of intestinal carbohydrate absorption: Secondary | ICD-10-CM | POA: Diagnosis not present

## 2019-08-18 DIAGNOSIS — F332 Major depressive disorder, recurrent severe without psychotic features: Secondary | ICD-10-CM

## 2019-08-18 DIAGNOSIS — Z23 Encounter for immunization: Secondary | ICD-10-CM

## 2019-08-18 DIAGNOSIS — Z0000 Encounter for general adult medical examination without abnormal findings: Secondary | ICD-10-CM

## 2019-08-18 DIAGNOSIS — I10 Essential (primary) hypertension: Secondary | ICD-10-CM

## 2019-08-18 DIAGNOSIS — N3946 Mixed incontinence: Secondary | ICD-10-CM | POA: Diagnosis not present

## 2019-08-18 DIAGNOSIS — Z0001 Encounter for general adult medical examination with abnormal findings: Secondary | ICD-10-CM

## 2019-08-18 DIAGNOSIS — Z1211 Encounter for screening for malignant neoplasm of colon: Secondary | ICD-10-CM | POA: Diagnosis not present

## 2019-08-18 LAB — URINALYSIS, ROUTINE W REFLEX MICROSCOPIC
Bacteria, UA: NONE SEEN /HPF
Bilirubin Urine: NEGATIVE
Glucose, UA: NEGATIVE
Hyaline Cast: NONE SEEN /LPF
Ketones, ur: NEGATIVE
Leukocytes,Ua: NEGATIVE
Nitrite: NEGATIVE
Protein, ur: NEGATIVE
Specific Gravity, Urine: 1.025 (ref 1.001–1.03)
WBC, UA: NONE SEEN /HPF (ref 0–5)
pH: 6 (ref 5.0–8.0)

## 2019-08-18 LAB — MICROSCOPIC MESSAGE

## 2019-08-18 MED ORDER — MIRABEGRON ER 25 MG PO TB24: 25.0000 mg | ORAL_TABLET | Freq: Every day | ORAL | 3 refills | Status: DC

## 2019-08-18 MED ORDER — ESCITALOPRAM OXALATE 10 MG PO TABS: ORAL_TABLET | ORAL | 2 refills | Status: DC

## 2019-08-18 MED ORDER — HYDROCHLOROTHIAZIDE 25 MG PO TABS: ORAL_TABLET | ORAL | 2 refills | Status: DC

## 2019-08-18 NOTE — Progress Notes (Signed)
Subjective:   Patient presents for Medicare Annual/Subsequent preventive examination.    Pt here to for CPE  Medications reviewed   Last visit was in Jul 2019   Major concern is urine starts leaking before she can make to the bathroom. Occ has burning with urination  No blood in urine, bowel movements are normal  she does get up 5-6 times a night to uriante    HTN- she has been taking HCTZ , she has been taking at night instead of the morning   MDD- lexapro only takes  2-3 times a week, though we discussed  Review Past Medical/Family/Social:Per EMR    Risk Factors  Current exercise habits: very little  Dietary issues discussed: Yes  Cardiac risk factors: Obesity (BMI >= 30 kg/m2). HTN  Depression Screen  (Note: if answer to either of the following is "Yes", a more complete depression screening is indicated)  Over the past two weeks, have you felt down, depressed or hopeless? No Over the past two weeks, have you felt little interest or pleasure in doing things? No Have you lost interest or pleasure in daily life? No Do you often feel hopeless? No Do you cry easily over simple problems? No   Activities of Daily Living  In your present state of health, do you have any difficulty performing the following activities?:  Driving? No  Managing money? No  Feeding yourself? No  Getting from bed to chair? No  Climbing a flight of stairs? No  Preparing food and eating?: No  Bathing or showering? No  Getting dressed: No  Getting to the toilet? No  Using the toilet:No  Moving around from place to place: No  In the past year have you fallen or had a near fall?:No  Are you sexually active? No  Do you have more than one partner? No   Hearing Difficulties: No  Do you often ask people to speak up or repeat themselves? No  Do you experience ringing or noises in your ears? No Do you have difficulty understanding soft or whispered voices? No  Do you feel that you have a problem  with memory? No Do you often misplace items? No  Do you feel safe at home? Yes  Cognitive Testing  Alert? Yes Normal Appearance?Yes  Oriented to person? Yes Place? Yes  Time? Yes  Recall of three objects? Yes  Can perform simple calculations? Yes  Displays appropriate judgment?Yes  Can read the correct time from a watch face?Yes   List the Names of Other Physician/Practitioners you currently use:  None     Screening Tests / Date Colonoscopy    Due                  Shingles vaccine- Due  Mammogram UTD in Sept  PAP Smear- UTD  2019 Influenza Vaccine  Tetanus/tdap UTD   ROS: GEN- denies fatigue, fever, weight loss,weakness, recent illness HEENT- denies eye drainage, change in vision, nasal discharge, CVS- denies chest pain, palpitations RESP- denies SOB, cough, wheeze ABD- denies N/V, change in stools, abd pain GU- denies dysuria, hematuria, dribbling, incontinence MSK- denies joint pain, muscle aches, injury Neuro- denies headache, dizziness, syncope, seizure activity  Physical: GEN- NAD, alert and oriented x3 HEENT- PERRL, EOMI, non injected sclera, pink conjunctiva, MMM, oropharynx clear Neck- Supple, no thryomegaly Nodes- no axillary nodes CVS- RRR, no murmur RESP-CTAB ABD-NABS,soft, NT,ND Psych-pleasant, normal affect and mood  EXT- No edema Pulses- Radial, DP- 2+   Assessment:  Annual wellness medicare exam   Plan:    During the course of the visit the patient was educated and counseled about appropriate screening and preventive services including:  Referral for colonoscopy  Fall/audit C screening negative  Depression again discussed the importance of taking her Lexapro daily.  She did ask for refill on this.  OAB with mixed incontinence she is willing to try medication again we will restart Myrbetriq.  Her urinalysis did not show any infection today.  Hypertension this is controlled with HCTZ will move this to the morning.  I do think the  diuretic is keeping her up at night.  Obesity discussed dietary changes we will check her fasting labs today  Immunizations :  flu shot given.  Shingles shot sent to pharmacy  Diet review for nutrition referral? Yes ____ Not Indicated __x__  Patient Instructions (the written plan) was given to the patient.  Medicare Attestation  I have personally reviewed:  The patient's medical and social history  Their use of alcohol, tobacco or illicit drugs  Their current medications and supplements  The patient's functional ability including ADLs,fall risks, home safety risks, cognitive, and hearing and visual impairment  Diet and physical activities  Evidence for depression or mood disorders  The patient's weight, height, BMI, and visual acuity have been recorded in the chart. I have made referrals, counseling, and provided education to the patient based on review of the above and I have provided the patient with a written personalized care plan for preventive services.

## 2019-08-18 NOTE — Patient Instructions (Addendum)
Shingles shot sent to pharmacy  FLu shot given today  Referral for colonoscopy  We will call with lab results  F/U 6 months

## 2019-08-19 ENCOUNTER — Encounter (INDEPENDENT_AMBULATORY_CARE_PROVIDER_SITE_OTHER): Payer: Self-pay | Admitting: *Deleted

## 2019-08-19 ENCOUNTER — Telehealth: Payer: Self-pay | Admitting: *Deleted

## 2019-08-19 LAB — CBC WITH DIFFERENTIAL/PLATELET
Absolute Monocytes: 259 cells/uL (ref 200–950)
Basophils Absolute: 28 cells/uL (ref 0–200)
Basophils Relative: 0.5 %
Eosinophils Absolute: 61 cells/uL (ref 15–500)
Eosinophils Relative: 1.1 %
HCT: 40.6 % (ref 35.0–45.0)
Hemoglobin: 13.4 g/dL (ref 11.7–15.5)
Lymphs Abs: 1551 cells/uL (ref 850–3900)
MCH: 28.4 pg (ref 27.0–33.0)
MCHC: 33 g/dL (ref 32.0–36.0)
MCV: 86 fL (ref 80.0–100.0)
MPV: 10.1 fL (ref 7.5–12.5)
Monocytes Relative: 4.7 %
Neutro Abs: 3603 cells/uL (ref 1500–7800)
Neutrophils Relative %: 65.5 %
Platelets: 260 10*3/uL (ref 140–400)
RBC: 4.72 10*6/uL (ref 3.80–5.10)
RDW: 12.4 % (ref 11.0–15.0)
Total Lymphocyte: 28.2 %
WBC: 5.5 10*3/uL (ref 3.8–10.8)

## 2019-08-19 LAB — LIPID PANEL
Cholesterol: 188 mg/dL (ref <200)
HDL: 42 mg/dL — ABNORMAL LOW (ref >=50)
LDL Cholesterol (Calc): 130 mg/dL (calc) — ABNORMAL HIGH
Non-HDL Cholesterol (Calc): 146 mg/dL (calc) — ABNORMAL HIGH (ref <130)
Total CHOL/HDL Ratio: 4.5 (calc) (ref <5.0)
Triglycerides: 66 mg/dL (ref <150)

## 2019-08-19 LAB — COMPREHENSIVE METABOLIC PANEL
AG Ratio: 1.3 (calc) (ref 1.0–2.5)
ALT: 15 U/L (ref 6–29)
AST: 22 U/L (ref 10–35)
Albumin: 4 g/dL (ref 3.6–5.1)
Alkaline phosphatase (APISO): 103 U/L (ref 37–153)
BUN: 12 mg/dL (ref 7–25)
CO2: 28 mmol/L (ref 20–32)
Calcium: 9.3 mg/dL (ref 8.6–10.4)
Chloride: 105 mmol/L (ref 98–110)
Creat: 0.97 mg/dL (ref 0.50–0.99)
Globulin: 3.1 g/dL (calc) (ref 1.9–3.7)
Glucose, Bld: 103 mg/dL — ABNORMAL HIGH (ref 65–99)
Potassium: 4.1 mmol/L (ref 3.5–5.3)
Sodium: 142 mmol/L (ref 135–146)
Total Bilirubin: 0.4 mg/dL (ref 0.2–1.2)
Total Protein: 7.1 g/dL (ref 6.1–8.1)

## 2019-08-19 MED ORDER — SHINGRIX 50 MCG/0.5ML IM SUSR: 0.5000 mL | Freq: Once | INTRAMUSCULAR | 1 refills | Status: AC

## 2019-08-19 NOTE — Telephone Encounter (Signed)
-----   Message from Alycia Rossetti, MD sent at 08/18/2019  3:03 PM EDT ----- Regarding: Send shingrix to pharmacy

## 2019-08-26 ENCOUNTER — Other Ambulatory Visit: Payer: Self-pay | Admitting: *Deleted

## 2019-08-26 MED ORDER — CLOTRIMAZOLE-BETAMETHASONE 1-0.05 % EX CREA: 1.0000 "application " | TOPICAL_CREAM | Freq: Two times a day (BID) | CUTANEOUS | 3 refills | Status: DC

## 2019-10-25 ENCOUNTER — Other Ambulatory Visit: Payer: Self-pay | Admitting: Family Medicine

## 2020-01-01 ENCOUNTER — Ambulatory Visit: Payer: Medicare Other

## 2020-03-27 ENCOUNTER — Other Ambulatory Visit: Payer: Self-pay | Admitting: Family Medicine

## 2020-05-15 ENCOUNTER — Other Ambulatory Visit: Payer: Self-pay | Admitting: Family Medicine

## 2020-05-15 ENCOUNTER — Ambulatory Visit: Payer: Medicare Other | Admitting: Nurse Practitioner

## 2020-05-22 ENCOUNTER — Ambulatory Visit (INDEPENDENT_AMBULATORY_CARE_PROVIDER_SITE_OTHER): Payer: Medicare Other | Admitting: Family Medicine

## 2020-05-22 ENCOUNTER — Encounter: Payer: Self-pay | Admitting: Family Medicine

## 2020-05-22 ENCOUNTER — Other Ambulatory Visit: Payer: Self-pay

## 2020-05-22 VITALS — BP 130/64 | HR 86 | Temp 97.8°F | Resp 16 | Ht 62.0 in | Wt 266.0 lb

## 2020-05-22 DIAGNOSIS — F4320 Adjustment disorder, unspecified: Secondary | ICD-10-CM

## 2020-05-22 DIAGNOSIS — E785 Hyperlipidemia, unspecified: Secondary | ICD-10-CM | POA: Diagnosis not present

## 2020-05-22 DIAGNOSIS — E669 Obesity, unspecified: Secondary | ICD-10-CM | POA: Diagnosis not present

## 2020-05-22 DIAGNOSIS — F332 Major depressive disorder, recurrent severe without psychotic features: Secondary | ICD-10-CM

## 2020-05-22 DIAGNOSIS — N3946 Mixed incontinence: Secondary | ICD-10-CM | POA: Diagnosis not present

## 2020-05-22 DIAGNOSIS — F4321 Adjustment disorder with depressed mood: Secondary | ICD-10-CM | POA: Diagnosis not present

## 2020-05-22 DIAGNOSIS — R7303 Prediabetes: Secondary | ICD-10-CM

## 2020-05-22 DIAGNOSIS — I10 Essential (primary) hypertension: Secondary | ICD-10-CM | POA: Diagnosis not present

## 2020-05-22 DIAGNOSIS — E66813 Obesity, class 3: Secondary | ICD-10-CM

## 2020-05-22 DIAGNOSIS — N6320 Unspecified lump in the left breast, unspecified quadrant: Secondary | ICD-10-CM | POA: Diagnosis not present

## 2020-05-22 MED ORDER — LORAZEPAM 0.5 MG PO TABS
0.5000 mg | ORAL_TABLET | Freq: Two times a day (BID) | ORAL | 0 refills | Status: DC | PRN
Start: 2020-05-22 — End: 2020-09-06

## 2020-05-22 MED ORDER — MIRABEGRON ER 25 MG PO TB24: ORAL_TABLET | ORAL | 1 refills | Status: DC

## 2020-05-22 MED ORDER — ALBUTEROL SULFATE HFA 108 (90 BASE) MCG/ACT IN AERS: INHALATION_SPRAY | RESPIRATORY_TRACT | 1 refills | Status: DC

## 2020-05-22 MED ORDER — HYDROCHLOROTHIAZIDE 25 MG PO TABS: ORAL_TABLET | ORAL | 2 refills | Status: DC

## 2020-05-22 MED ORDER — ESCITALOPRAM OXALATE 10 MG PO TABS: ORAL_TABLET | ORAL | 2 refills | Status: DC

## 2020-05-22 NOTE — Progress Notes (Signed)
Subjective:    Patient ID: Sheryl [name], female    DOB: 06/24/1956, 64 y.o.   MRN: [id number]  Patient presents for B Breast Pain (x3 weeks- has pain and knot like areas to B breasts)  Patient here with her daughter today.  Her last visit was back in the fall.  He did not want to come in because of Covid. She is here secondary to breast discomfort mostly in the left breast.  She does have history of abnormal mammogram she has a mass they have been following in the left breast which looks benign appearing based on previous imaging.  She is planned to have a 73-month recall after her abnormal mammogram in September but states that she never received a letter believes it was pushed out because of COVID-19.  Over the past month she has noted increased pain and she can palpate the mass in the left breast.  She also felt like she felt something in the right leg but now cannot feel it.  She has not had any discharge from the nipple.  Unfortunately her mother passed away last week.  She had not been taking her Lexapro.  She has not been sleeping very well quite tearful per her daughter.  She admits that she has not been following up.   She also has Myrbetriq on her list for her overactive bladder would like to have this refilled.  Also requested a refill on albuterol inhaler she likes to keep at home she does get bronchitis with viral infections. Review Of Systems:  GEN- denies fatigue, fever, weight loss,weakness, recent illness HEENT- denies eye drainage, change in vision, nasal discharge, CVS- denies chest pain, palpitations RESP- denies SOB, cough, wheeze ABD- denies N/V, change in stools, abd pain GU- denies dysuria, hematuria, dribbling, incontinence MSK- denies joint pain, muscle aches, injury Neuro- denies headache, dizziness, syncope, seizure activity       Objective:    BP 130/64   Pulse 86   Temp 97.8 F (36.6 C) (Temporal)   Resp 16   Ht 5\' 2"  (1.575 m)   Wt 266 lb  (120.7 kg)   LMP 12/20/2011   SpO2 99%   BMI 48.65 kg/m  GEN- NAD, alert and oriented x3 HEENT- PERRL, EOMI, non injected sclera, pink conjunctiva, MMM, oropharynx clear Neck- Supple, no thyromegaly CVS- RRR, no murmur RESP-CTAB Breast- normal symmetry, no nipple inversion,no nipple drainage, left breast 10 oclock position nodule palpated, mild TTP, TTP bear 6 oclock position no mass palpated, no mass palpated right breast  Psych- tearful depressed affect, no SI  Nodes- no axillary nodes ABD-NABS,soft,NT,ND EXT- trace ankle edema Pulses- Radial, DP- 2+        Assessment & Plan:      Problem List Items Addressed This Visit      Unprioritized   Class 3 obesity   Essential hypertension, benign - Primary    Controlled no changes to HCTZ Labs obtained today Discussed with pt and daughter need for routine follow up and compliance with meds       Relevant Medications   hydrochlorothiazide (HYDRODIURIL) 25 MG tablet   Other Relevant Orders   CBC with Differential/Platelet   Comprehensive metabolic panel   MDD (major depressive disorder)    Restart lexapro,will bridge with ativan BID prn due to recent grief, passing of mother , not sleeping well Has support from her children   She is also anxious about breat mass, diagnostic mammogram to be done with [place] per  protocal       Relevant Medications   escitalopram (LEXAPRO) 10 MG tablet   LORazepam (ATIVAN) 0.5 MG tablet   Mixed incontinence urge and stress    Other Visit Diagnoses    Left breast mass       Diagnostic mammogram to be done    Relevant Orders   MM DIAG BREAST TOMO BILATERAL   US BREAST LTD UNI LEFT INC AXILLA   US BREAST LTD UNI RIGHT INC AXILLA   Grief reaction       Borderline diabetes       RECHECK a1c    Relevant Orders   Hemoglobin A1c   Mild hyperlipidemia       Relevant Medications   hydrochlorothiazide (HYDRODIURIL) 25 MG tablet   Other Relevant Orders   Lipid panel      Note: This  dictation was prepared with Dragon dictation along with smaller phrase technology. Any transcriptional errors that result from this process are unintentional.

## 2020-05-22 NOTE — Assessment & Plan Note (Signed)
Controlled no changes to HCTZ Labs obtained today Discussed with pt and daughter need for routine follow up and compliance with meds

## 2020-05-22 NOTE — Assessment & Plan Note (Signed)
Restart lexapro,will bridge with ativan BID prn due to recent grief, passing of mother , not sleeping well Has support from her children   She is also anxious about breat mass, diagnostic mammogram to be done with [place] per protocal

## 2020-05-22 NOTE — Patient Instructions (Addendum)
Mammogram to be done - August 24th at [facility]- [place]  [phone] Or in [place]-  [facility]- Dr. Andree Elk [phone] Medications have been refilled F/U 3-4 months for Physical

## 2020-05-23 LAB — CBC WITH DIFFERENTIAL/PLATELET
Absolute Monocytes: 348 cells/uL (ref 200–950)
Basophils Absolute: 41 cells/uL (ref 0–200)
Basophils Relative: 0.7 %
Eosinophils Absolute: 41 cells/uL (ref 15–500)
Eosinophils Relative: 0.7 %
HCT: 40.1 % (ref 35.0–45.0)
Hemoglobin: 13.3 g/dL (ref 11.7–15.5)
Lymphs Abs: 1972 cells/uL (ref 850–3900)
MCH: 28.7 pg (ref 27.0–33.0)
MCHC: 33.2 g/dL (ref 32.0–36.0)
MCV: 86.6 fL (ref 80.0–100.0)
MPV: 9.9 fL (ref 7.5–12.5)
Monocytes Relative: 6 %
Neutro Abs: 3399 cells/uL (ref 1500–7800)
Neutrophils Relative %: 58.6 %
Platelets: 277 10*3/uL (ref 140–400)
RBC: 4.63 10*6/uL (ref 3.80–5.10)
RDW: 13.1 % (ref 11.0–15.0)
Total Lymphocyte: 34 %
WBC: 5.8 10*3/uL (ref 3.8–10.8)

## 2020-05-23 LAB — COMPREHENSIVE METABOLIC PANEL
AG Ratio: 1.2 (calc) (ref 1.0–2.5)
ALT: 14 U/L (ref 6–29)
AST: 21 U/L (ref 10–35)
Albumin: 3.8 g/dL (ref 3.6–5.1)
Alkaline phosphatase (APISO): 90 U/L (ref 37–153)
BUN/Creatinine Ratio: 11 (calc) (ref 6–22)
BUN: 12 mg/dL (ref 7–25)
CO2: 27 mmol/L (ref 20–32)
Calcium: 9 mg/dL (ref 8.6–10.4)
Chloride: 105 mmol/L (ref 98–110)
Creat: 1.07 mg/dL — ABNORMAL HIGH (ref 0.50–0.99)
Globulin: 3.1 g/dL (calc) (ref 1.9–3.7)
Glucose, Bld: 87 mg/dL (ref 65–99)
Potassium: 4 mmol/L (ref 3.5–5.3)
Sodium: 143 mmol/L (ref 135–146)
Total Bilirubin: 0.5 mg/dL (ref 0.2–1.2)
Total Protein: 6.9 g/dL (ref 6.1–8.1)

## 2020-05-23 LAB — HEMOGLOBIN A1C
Hgb A1c MFr Bld: 6 % of total Hgb — ABNORMAL HIGH (ref <5.7)
Mean Plasma Glucose: 126 (calc)
eAG (mmol/L): 7 (calc)

## 2020-05-23 LAB — LIPID PANEL
Cholesterol: 166 mg/dL (ref <200)
HDL: 38 mg/dL — ABNORMAL LOW (ref >=50)
LDL Cholesterol (Calc): 108 mg/dL (calc) — ABNORMAL HIGH
Non-HDL Cholesterol (Calc): 128 mg/dL (calc) (ref <130)
Total CHOL/HDL Ratio: 4.4 (calc) (ref <5.0)
Triglycerides: 104 mg/dL (ref <150)

## 2020-05-26 ENCOUNTER — Encounter: Payer: Self-pay | Admitting: *Deleted

## 2020-06-13 ENCOUNTER — Other Ambulatory Visit: Payer: Self-pay

## 2020-06-13 ENCOUNTER — Ambulatory Visit (HOSPITAL_COMMUNITY)
Admission: RE | Admit: 2020-06-13 | Discharge: 2020-06-13 | Disposition: A | Discharge status: Home / Self Care | Payer: Medicare Other | Source: Ambulatory Visit | Attending: Family Medicine | Admitting: Family Medicine

## 2020-06-13 ENCOUNTER — Other Ambulatory Visit (HOSPITAL_COMMUNITY): Payer: Medicare Other

## 2020-06-13 DIAGNOSIS — N6320 Unspecified lump in the left breast, unspecified quadrant: Secondary | ICD-10-CM

## 2020-06-13 DIAGNOSIS — N6325 Unspecified lump in the left breast, overlapping quadrants: Secondary | ICD-10-CM | POA: Diagnosis not present

## 2020-06-13 DIAGNOSIS — R928 Other abnormal and inconclusive findings on diagnostic imaging of breast: Secondary | ICD-10-CM | POA: Diagnosis not present

## 2020-06-19 ENCOUNTER — Encounter: Payer: Self-pay | Admitting: Family Medicine

## 2020-06-19 ENCOUNTER — Ambulatory Visit (INDEPENDENT_AMBULATORY_CARE_PROVIDER_SITE_OTHER): Payer: Medicare Other | Admitting: Family Medicine

## 2020-06-19 ENCOUNTER — Other Ambulatory Visit: Payer: Self-pay

## 2020-06-19 VITALS — BP 130/84 | HR 76 | Temp 98.2°F | Resp 18 | Ht 62.0 in | Wt 263.0 lb

## 2020-06-19 DIAGNOSIS — M545 Low back pain, unspecified: Secondary | ICD-10-CM

## 2020-06-19 DIAGNOSIS — M542 Cervicalgia: Secondary | ICD-10-CM | POA: Diagnosis not present

## 2020-06-19 MED ORDER — NAPROXEN 500 MG PO TABS
500.0000 mg | ORAL_TABLET | Freq: Two times a day (BID) | ORAL | 1 refills | Status: DC
Start: 2020-06-19 — End: 2020-10-12

## 2020-06-19 MED ORDER — TIZANIDINE HCL 4 MG PO TABS
4.0000 mg | ORAL_TABLET | Freq: Three times a day (TID) | ORAL | 0 refills | Status: DC | PRN
Start: 2020-06-19 — End: 2021-04-27

## 2020-06-19 NOTE — Patient Instructions (Addendum)
Take naprosyn twice a day  Use zanaflex for muscle relaxer  Get the xray done at [facility]  F/u AS PREVIOUS
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
## 2020-06-20 ENCOUNTER — Encounter: Payer: Self-pay | Admitting: Family Medicine
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
## 2020-06-21 ENCOUNTER — Ambulatory Visit (HOSPITAL_COMMUNITY)
Admission: RE | Admit: 2020-06-21 | Discharge: 2020-06-21 | Disposition: A | Discharge status: Home / Self Care | Payer: Medicare Other | Source: Ambulatory Visit | Attending: Family Medicine | Admitting: Family Medicine

## 2020-06-21 ENCOUNTER — Other Ambulatory Visit: Payer: Self-pay

## 2020-06-21 DIAGNOSIS — M542 Cervicalgia: Secondary | ICD-10-CM | POA: Insufficient documentation

## 2020-06-21 DIAGNOSIS — M545 Low back pain, unspecified: Secondary | ICD-10-CM

## 2020-09-03 DIAGNOSIS — J020 Streptococcal pharyngitis: Secondary | ICD-10-CM | POA: Diagnosis not present

## 2020-09-03 DIAGNOSIS — M26602 Left temporomandibular joint disorder, unspecified: Secondary | ICD-10-CM | POA: Diagnosis not present

## 2020-09-03 DIAGNOSIS — H9202 Otalgia, left ear: Secondary | ICD-10-CM | POA: Diagnosis not present

## 2020-09-03 DIAGNOSIS — F419 Anxiety disorder, unspecified: Secondary | ICD-10-CM | POA: Diagnosis not present

## 2020-09-03 DIAGNOSIS — I10 Essential (primary) hypertension: Secondary | ICD-10-CM | POA: Diagnosis not present

## 2020-09-03 DIAGNOSIS — M26629 Arthralgia of temporomandibular joint, unspecified side: Secondary | ICD-10-CM | POA: Diagnosis not present

## 2020-09-06 ENCOUNTER — Encounter: Payer: Self-pay | Admitting: Family Medicine

## 2020-09-06 ENCOUNTER — Ambulatory Visit (INDEPENDENT_AMBULATORY_CARE_PROVIDER_SITE_OTHER): Payer: Medicare Other | Admitting: Family Medicine

## 2020-09-06 ENCOUNTER — Other Ambulatory Visit: Payer: Self-pay

## 2020-09-06 VITALS — BP 128/78 | HR 76 | Temp 98.1°F | Resp 14 | Ht 62.0 in | Wt 257.0 lb

## 2020-09-06 DIAGNOSIS — I10 Essential (primary) hypertension: Secondary | ICD-10-CM | POA: Diagnosis not present

## 2020-09-06 DIAGNOSIS — Z0001 Encounter for general adult medical examination with abnormal findings: Secondary | ICD-10-CM

## 2020-09-06 DIAGNOSIS — F332 Major depressive disorder, recurrent severe without psychotic features: Secondary | ICD-10-CM

## 2020-09-06 DIAGNOSIS — Z23 Encounter for immunization: Secondary | ICD-10-CM | POA: Diagnosis not present

## 2020-09-06 DIAGNOSIS — Z0000 Encounter for general adult medical examination without abnormal findings: Secondary | ICD-10-CM

## 2020-09-06 DIAGNOSIS — N6320 Unspecified lump in the left breast, unspecified quadrant: Secondary | ICD-10-CM | POA: Diagnosis not present

## 2020-09-06 DIAGNOSIS — R7303 Prediabetes: Secondary | ICD-10-CM

## 2020-09-06 DIAGNOSIS — Z1211 Encounter for screening for malignant neoplasm of colon: Secondary | ICD-10-CM

## 2020-09-06 DIAGNOSIS — E669 Obesity, unspecified: Secondary | ICD-10-CM | POA: Diagnosis not present

## 2020-09-06 DIAGNOSIS — E66813 Obesity, class 3: Secondary | ICD-10-CM

## 2020-09-06 MED ORDER — LORAZEPAM 0.5 MG PO TABS: 0.5000 mg | ORAL_TABLET | Freq: Two times a day (BID) | ORAL | 0 refills | Status: DC | PRN

## 2020-09-06 MED ORDER — MIRABEGRON ER 25 MG PO TB24: ORAL_TABLET | ORAL | 1 refills | Status: AC

## 2020-09-06 NOTE — Progress Notes (Signed)
Subjective:   Patient presents for Medicare Annual/Subsequent preventive examination.    Pt here for wellness visit  She was seen in ER at [facility] for sore throat, headache. She was was given amoxicillin which she is still taking   no fever   HTn- taking HCTZ   MDD- taking lexapro, this was restarted back in august- she states things have been very stressful, her mother passed away and she had to move because her rental was being sold She is not sure if she has the ativan   Mixed incontinence she is not sure if she is taking myrbetriq     Borderline DM - last A1C 6%      Review Past Medical/Family/Social: per EMR    Risk Factors  Current exercise habits: none  Dietary issues discussed: Yes  Cardiac risk factors: Obesity (BMI >= 30 kg/m2).   Depression Screen  Currently on treatment  (Note: if answer to either of the following is "Yes", a more complete depression screening is indicated)  Over the past two weeks, have you felt down, depressed or hopeless? Yes Over the past two weeks, have you felt little interest or pleasure in doing things? No Have you lost interest or pleasure in daily life? No Do you often feel hopeless? No Do you cry easily over simple problems? No   Activities of Daily Living  In your present state of health, do you have any difficulty performing the following activities?:  Driving? Yes,if not local   Managing money? No  Feeding yourself? No  Getting from bed to chair? No  Climbing a flight of stairs? No  Preparing food and eating?: No  Bathing or showering? No  Getting dressed: No  Getting to the toilet? No  Using the toilet:No  Moving around from place to place: No  In the past year have you fallen or had a near fall?:No    Hearing Difficulties: No  Do you often ask people to speak up or repeat themselves? No  Do you experience ringing or noises in your ears? No Do you have difficulty understanding soft or whispered voices? No  Do you  feel that you have a problem with memory? No Do you often misplace items? No  Do you feel safe at home? Yes  Cognitive Testing  Alert? Yes Normal Appearance?Yes  Oriented to person? Yes Place? Yes  Time? Yes  Recall of three objects? Yes  Can perform simple calculations? Yes  Displays appropriate judgment?Yes  Can read the correct time from a watch face?Yes   List the Names of Other Physician/Practitioners you currently use:  None   Screening Tests / Date Colonoscopy  -Overdue                    Zostavax Due  COVID-19- pt declines Mammogram  - due, also needs ultrasound of left breast for recheck  Influenza Vaccine  - Due  PAP Smear UTD Tetanus/tdap UTD  ROS:  GEN- denies fatigue, fever, weight loss,weakness, recent illness HEENT- denies eye drainage, change in vision, nasal discharge, CVS- denies chest pain, palpitations RESP- denies SOB, cough, wheeze ABD- denies N/V, change in stools, abd pain GU- denies dysuria, hematuria, dribbling, incontinence MSK- denies joint pain, muscle aches, injury Neuro- denies headache, dizziness, syncope, seizure activity  Physical: GEN- NAD, alert and oriented x3 HEENT- PERRL, EOMI, non injected sclera, pink conjunctiva, MMM, oropharynx injected, no exudate, TM clear, no effiusion, no facial tenderness  Neck- Supple, no thryomegaly CVS- RRR,  no murmur RESP-CTAB ABD-NABS,soft,NT,ND Psych- normal affect and mood  EXT- trace ankle edema Pulses- Radial, DP- 2+   Assessment:    Annual wellness medicare exam   Plan:    During the course of the visit the patient was educated and counseled about appropriate screening and preventive services including:   She has had a difficult past few months in the setting of her mother passing that she had to abruptly mood.  She is not sure some of her medications today as she thinks it may still be possible.  Refill her Myrbetriq for her incontinence as well as her lorazepam to take along with her  Lexapro.  She is due for repeat imaging on the left breast because of abnormality so orders were placed for this.  Referral to GI for colon cancer screening.  Immunizations flu shot given today.  Discussed COVID 19 vaccination she is contemplating getting this.  Strep pharyngitis she is to complete her antibiotics she can also add ibuprofen and gargle with salt water.  Class 3 obesity - borderline DM recheck A1C   HTn controlled  She states children are assisting her   Full code             rral? Yes ____ Not Indicated __x__  Patient Instructions (the written plan) was given to the patient.  Medicare Attestation  I have personally reviewed:  The patient's medical and social history  Their use of alcohol, tobacco or illicit drugs  Their current medications and supplements  The patient's functional ability including ADLs,fall risks, home safety risks, cognitive, and hearing and visual impairment  Diet and physical activities  Evidence for depression or mood disorders  The patient's weight, height, BMI, and visual acuity have been recorded in the chart. I have made referrals, counseling, and provided education to the patient based on review of the above and I have provided the patient with a written personalized care plan for preventive services.

## 2020-09-06 NOTE — Patient Instructions (Addendum)
Use naprosyn for sore throat and joint pain Gargle with salt water Take up the antibiotics  Schedule your follow up mammogram  Referral for colonoscopy  We will call with lab results Flu shot given I have refilled your myrbetriq for your bladder I have refilled your ativan (lorazempam)  F/U 4 months

## 2020-09-07 ENCOUNTER — Encounter: Payer: Self-pay | Admitting: Family Medicine

## 2020-09-07 DIAGNOSIS — E785 Hyperlipidemia, unspecified: Secondary | ICD-10-CM | POA: Insufficient documentation

## 2020-09-07 LAB — CBC WITH DIFFERENTIAL/PLATELET
Absolute Monocytes: 347 cells/uL (ref 200–950)
Basophils Absolute: 37 cells/uL (ref 0–200)
Basophils Relative: 0.6 %
Eosinophils Absolute: 19 cells/uL (ref 15–500)
Eosinophils Relative: 0.3 %
HCT: 44.7 % (ref 35.0–45.0)
Hemoglobin: 14.8 g/dL (ref 11.7–15.5)
Lymphs Abs: 1680 cells/uL (ref 850–3900)
MCH: 29.2 pg (ref 27.0–33.0)
MCHC: 33.1 g/dL (ref 32.0–36.0)
MCV: 88.2 fL (ref 80.0–100.0)
MPV: 9.9 fL (ref 7.5–12.5)
Monocytes Relative: 5.6 %
Neutro Abs: 4117 cells/uL (ref 1500–7800)
Neutrophils Relative %: 66.4 %
Platelets: 277 10*3/uL (ref 140–400)
RBC: 5.07 10*6/uL (ref 3.80–5.10)
RDW: 12.6 % (ref 11.0–15.0)
Total Lymphocyte: 27.1 %
WBC: 6.2 10*3/uL (ref 3.8–10.8)

## 2020-09-07 LAB — COMPREHENSIVE METABOLIC PANEL
AG Ratio: 1.2 (calc) (ref 1.0–2.5)
ALT: 13 U/L (ref 6–29)
AST: 20 U/L (ref 10–35)
Albumin: 4.1 g/dL (ref 3.6–5.1)
Alkaline phosphatase (APISO): 101 U/L (ref 37–153)
BUN/Creatinine Ratio: 12 (calc) (ref 6–22)
BUN: 14 mg/dL (ref 7–25)
CO2: 29 mmol/L (ref 20–32)
Calcium: 9.4 mg/dL (ref 8.6–10.4)
Chloride: 101 mmol/L (ref 98–110)
Creat: 1.17 mg/dL — ABNORMAL HIGH (ref 0.50–0.99)
Globulin: 3.3 g/dL (calc) (ref 1.9–3.7)
Glucose, Bld: 102 mg/dL — ABNORMAL HIGH (ref 65–99)
Potassium: 3.8 mmol/L (ref 3.5–5.3)
Sodium: 140 mmol/L (ref 135–146)
Total Bilirubin: 0.4 mg/dL (ref 0.2–1.2)
Total Protein: 7.4 g/dL (ref 6.1–8.1)

## 2020-09-07 LAB — LIPID PANEL
Cholesterol: 185 mg/dL (ref <200)
HDL: 43 mg/dL — ABNORMAL LOW (ref >=50)
LDL Cholesterol (Calc): 125 mg/dL (calc) — ABNORMAL HIGH
Non-HDL Cholesterol (Calc): 142 mg/dL (calc) — ABNORMAL HIGH (ref <130)
Total CHOL/HDL Ratio: 4.3 (calc) (ref <5.0)
Triglycerides: 80 mg/dL (ref <150)

## 2020-09-07 LAB — HEMOGLOBIN A1C
Hgb A1c MFr Bld: 6 % of total Hgb — ABNORMAL HIGH (ref <5.7)
Mean Plasma Glucose: 126 (calc)
eAG (mmol/L): 7 (calc)

## 2020-09-08 ENCOUNTER — Other Ambulatory Visit: Payer: Self-pay

## 2020-09-08 DIAGNOSIS — R7303 Prediabetes: Secondary | ICD-10-CM

## 2020-09-08 MED ORDER — ATORVASTATIN CALCIUM 10 MG PO TABS: 10.0000 mg | ORAL_TABLET | Freq: Every day | ORAL | 3 refills | Status: DC

## 2020-09-11 ENCOUNTER — Encounter (INDEPENDENT_AMBULATORY_CARE_PROVIDER_SITE_OTHER): Payer: Self-pay | Admitting: *Deleted

## 2020-09-11 ENCOUNTER — Encounter: Payer: Self-pay | Admitting: Family Medicine

## 2020-09-20 ENCOUNTER — Other Ambulatory Visit: Payer: Self-pay | Admitting: Family Medicine

## 2020-09-20 DIAGNOSIS — N6320 Unspecified lump in the left breast, unspecified quadrant: Secondary | ICD-10-CM

## 2020-10-02 ENCOUNTER — Other Ambulatory Visit (HOSPITAL_COMMUNITY): Payer: Self-pay | Admitting: Family Medicine

## 2020-10-02 DIAGNOSIS — N63 Unspecified lump in unspecified breast: Secondary | ICD-10-CM

## 2020-10-10 ENCOUNTER — Ambulatory Visit (HOSPITAL_COMMUNITY)
Admission: RE | Admit: 2020-10-10 | Discharge: 2020-10-10 | Disposition: A | Discharge status: Home / Self Care | Payer: Medicare Other | Source: Ambulatory Visit | Attending: Family Medicine | Admitting: Family Medicine

## 2020-10-10 ENCOUNTER — Other Ambulatory Visit (HOSPITAL_COMMUNITY): Payer: Medicare Other

## 2020-10-10 ENCOUNTER — Other Ambulatory Visit: Payer: Self-pay

## 2020-10-10 DIAGNOSIS — N6320 Unspecified lump in the left breast, unspecified quadrant: Secondary | ICD-10-CM | POA: Diagnosis present

## 2020-10-10 DIAGNOSIS — N6311 Unspecified lump in the right breast, upper outer quadrant: Secondary | ICD-10-CM | POA: Diagnosis not present

## 2020-10-10 DIAGNOSIS — N6322 Unspecified lump in the left breast, upper inner quadrant: Secondary | ICD-10-CM | POA: Diagnosis not present

## 2020-10-10 DIAGNOSIS — R928 Other abnormal and inconclusive findings on diagnostic imaging of breast: Secondary | ICD-10-CM | POA: Diagnosis not present

## 2020-10-10 DIAGNOSIS — N63 Unspecified lump in unspecified breast: Secondary | ICD-10-CM

## 2020-10-12 ENCOUNTER — Telehealth (INDEPENDENT_AMBULATORY_CARE_PROVIDER_SITE_OTHER): Payer: Medicare Other | Admitting: Nurse Practitioner

## 2020-10-12 ENCOUNTER — Other Ambulatory Visit: Payer: Self-pay

## 2020-10-12 DIAGNOSIS — K0889 Other specified disorders of teeth and supporting structures: Secondary | ICD-10-CM | POA: Diagnosis not present

## 2020-10-12 DIAGNOSIS — R059 Cough, unspecified: Secondary | ICD-10-CM

## 2020-10-12 MED ORDER — ALBUTEROL SULFATE HFA 108 (90 BASE) MCG/ACT IN AERS: INHALATION_SPRAY | RESPIRATORY_TRACT | 0 refills | Status: DC

## 2020-10-12 MED ORDER — AMOXICILLIN-POT CLAVULANATE 875-125 MG PO TABS: 1.0000 | ORAL_TABLET | Freq: Two times a day (BID) | ORAL | 0 refills | Status: AC

## 2020-10-12 MED ORDER — NAPROXEN 500 MG PO TABS: 500.0000 mg | ORAL_TABLET | Freq: Two times a day (BID) | ORAL | 1 refills | Status: DC

## 2020-10-12 NOTE — Assessment & Plan Note (Signed)
Acute, ongoing.  Was positive for strep throat about 1 month ago.  Likely dental abscess vs. Strep throat.  Strongly encouraged patient to get in with Dentist to have teeth evaluated.  Will treat empirically with Augmentin 875-125 x 14 days.  Return to clinic if symptoms worsen or persist after treatment.

## 2020-10-12 NOTE — Progress Notes (Signed)
Subjective:    Patient ID: Sheryl [name], female    DOB: [birth year], 64 y.o.   MRN: [id number]  HPI: Sheryl [name] is a 64 y.o. female presenting for tooth pain, sore throat, and ear ache.  Chief Complaint  Patient presents with  . Pain    Having sore throat and earache. Believes it is due to tooth issue. Went to er for this in Nov. Asking for another refill of amoxillin until Dental appt taking tylenol   DENTAL PAIN Was seen in ER for similar symptoms about 1 month ago, was positive for strep throat and was treated with amoxicillin.  Reports her symptoms completely improved after taking full course of antibiotics but have slowly returned in the past week or so.   Duration: ~ 1 week Involved teeth: left and lower Dentist evaluation: no; is going to reach out to schedule dentist appointment Mechanism of injury:  unknown Onset: gradual Severity: streaks of pain under throat Quality: gnawing Frequency: constant Radiation: to jaw Aggravating factors: chewing   Alleviating factors: nothing Status: worse Treatments attempted: Tylenol Relief with NSAIDs?: no Fevers: no Swelling: yes; on left jaw Redness: no Paresthesias / decreased sensation: no Sinus pressure: no  Requesting refill of albuterol inhaler - has run out completely.  Was using more regularly for "mucus."  Reports she has never been told she has asthma or COPD.   No Known Allergies  Outpatient Encounter Medications as of 10/12/2020  Medication Sig  . atorvastatin (LIPITOR) 10 MG tablet Take 1 tablet (10 mg total) by mouth at bedtime.  . clotrimazole-betamethasone (LOTRISONE) cream Apply 1 application topically 2 (two) times daily.  Marland Kitchen escitalopram (LEXAPRO) 10 MG tablet For mood  . hydrochlorothiazide (HYDRODIURIL) 25 MG tablet TAKE 1 TABLET BY MOUTH IN THE MORNING FOR BLOOD PRESSURE  . LORazepam (ATIVAN) 0.5 MG tablet Take 1 tablet (0.5 mg total) by mouth 2 (two) times daily as needed for anxiety.  .  mirabegron ER (MYRBETRIQ) 25 MG TB24 tablet TAKE 1 TABLET BY MOUTH AT BEDTIME FOR YOUR BLADDER  . naproxen (NAPROSYN) 500 MG tablet Take 1 tablet (500 mg total) by mouth 2 (two) times daily with a meal.  . tiZANidine (ZANAFLEX) 4 MG tablet Take 1 tablet (4 mg total) by mouth every 8 (eight) hours as needed for muscle spasms.  . [DISCONTINUED] albuterol (VENTOLIN HFA) 108 (90 Base) MCG/ACT inhaler INHALE 2 PUFFS INTO THE LUNGS EVERY 4 HOURS AS NEEDED FOR WHEEZING OR SHORTNESS OF BREATH  . [DISCONTINUED] naproxen (NAPROSYN) 500 MG tablet Take 1 tablet (500 mg total) by mouth 2 (two) times daily with a meal.  . albuterol (VENTOLIN HFA) 108 (90 Base) MCG/ACT inhaler INHALE 2 PUFFS INTO THE LUNGS EVERY 4 HOURS AS NEEDED FOR WHEEZING OR SHORTNESS OF BREATH  . amoxicillin-clavulanate (AUGMENTIN) 875-125 MG tablet Take 1 tablet by mouth 2 (two) times daily for 14 days.  . [DISCONTINUED] amoxicillin (AMOXIL) 500 MG capsule Take 500 mg by mouth 3 (three) times daily.   No facility-administered encounter medications on file as of 10/12/2020.    Patient Active Problem List   Diagnosis Date Noted  . Pain, dental 10/12/2020  . Hyperlipemia 09/07/2020  . Borderline diabetes 09/06/2020  . Bullous impetigo 07/26/2015  . Mixed incontinence urge and stress 07/25/2015  . Rotator cuff syndrome of left shoulder 12/19/2014  . Difficulty in walking(719.7) 05/06/2012  . Stiffness of vertebral column 05/06/2012  . Decreased strength 05/06/2012  . Class 3 obesity 04/16/2012  . MDD (  major depressive disorder) 04/16/2012  . Essential hypertension, benign 04/16/2012  . Back pain 04/16/2012  . Cervical strain, acute 04/16/2012    Past Medical History:  Diagnosis Date  . Chronic back pain 2013  . Chronic neck pain 2013  . Depression   . Hypertension     Relevant past medical, surgical, family and social history reviewed and updated as indicated. Interim medical history since our last visit reviewed.  Review  of Systems  Constitutional: Negative.  Negative for fatigue and fever.  HENT: Positive for dental problem, ear pain, sore throat and trouble swallowing. Negative for congestion, drooling, ear discharge, facial swelling, hearing loss, rhinorrhea, sinus pressure, sinus pain, sneezing and voice change.   Eyes: Negative.   Respiratory: Positive for cough. Negative for chest tightness, shortness of breath and wheezing.   Cardiovascular: Negative.  Negative for chest pain.  Skin: Negative.   Neurological: Negative.   Psychiatric/Behavioral: Negative.     Per HPI unless specifically indicated above     Objective:    LMP 12/20/2011   Wt Readings from Last 3 Encounters:  09/06/20 257 lb (116.6 kg)  06/19/20 263 lb (119.3 kg)  05/22/20 266 lb (120.7 kg)    Physical Exam Vitals and nursing note reviewed.  Constitutional:      Appearance: Normal appearance. She is obese.  HENT:     Head: Normocephalic and atraumatic.     Right Ear: External ear normal.     Left Ear: External ear normal.     Nose: Nose normal. No congestion.     Mouth/Throat:     Mouth: Mucous membranes are moist.     Dentition: Abnormal dentition. Dental caries present.     Pharynx: Oropharynx is clear.  Eyes:     General: No scleral icterus.    Extraocular Movements: Extraocular movements intact.  Cardiovascular:     Comments: Unable to assess heart sounds via virtual visit Pulmonary:     Effort: Pulmonary effort is normal. No respiratory distress.     Comments: Unable to assess lung sounds via virtual visit; patient talking in complete sentences without accessory muscle use Skin:    Coloration: Skin is not jaundiced or pale.     Findings: No erythema.  Neurological:     Mental Status: She is alert and oriented to person, place, and time.  Psychiatric:        Mood and Affect: Mood normal.        Behavior: Behavior normal.        Thought Content: Thought content normal.        Judgment: Judgment normal.         Assessment & Plan:   Problem List Items Addressed This Visit      Other   Pain, dental - Primary    Acute, ongoing.  Was positive for strep throat about 1 month ago.  Likely dental abscess vs. Strep throat.  Strongly encouraged patient to get in with Dentist to have teeth evaluated.  Will treat empirically with Augmentin 875-125 x 14 days.  Return to clinic if symptoms worsen or persist after treatment.       Relevant Medications   naproxen (NAPROSYN) 500 MG tablet   amoxicillin-clavulanate (AUGMENTIN) 875-125 MG tablet    Other Visit Diagnoses    Cough       Unclear etiology - looks like she has been taking albuterol as needed for some time.  Encouraged her to reach out for evaluation if using daily. Refill given.  Relevant Medications   albuterol (VENTOLIN HFA) 108 (90 Base) MCG/ACT inhaler       Follow up plan: Return if symptoms worsen or fail to improve.  Due to the catastrophic nature of the COVID-19 pandemic, this visit was completed via audio and visual contact via [facility] due to the restrictions of the COVID-19 pandemic. All issues as above were discussed and addressed. Physical exam was done as above through visual confirmation on Careg[employer]. If it was felt that the patient should be evaluated in the office, they were directed there. The patient verbally consented to this visit."} . Location of the patient: home . Location of the provider: work . Those involved with this call:  . Provider: Carnella Guadalajara, DNP . CMA: Annabelle Harman, CMA . Front Desk/Registration: Jeralene Peters  . Time spent on call: 15 minutes with patient face to face via video conference. More than 50% of this time was spent in counseling and coordination of care. 30 minutes total spent in review of patient's record and preparation of their chart.  I verified patient identity using two factors (patient name and date of birth). Patient consents verbally to being seen via telemedicine visit  today.

## 2020-11-03 ENCOUNTER — Encounter: Payer: Self-pay | Admitting: Family Medicine

## 2020-11-03 ENCOUNTER — Other Ambulatory Visit: Payer: Self-pay

## 2020-11-03 ENCOUNTER — Telehealth (INDEPENDENT_AMBULATORY_CARE_PROVIDER_SITE_OTHER): Payer: Medicare Other | Admitting: Family Medicine

## 2020-11-03 DIAGNOSIS — Z8616 Personal history of COVID-19: Secondary | ICD-10-CM

## 2020-11-03 DIAGNOSIS — R29898 Other symptoms and signs involving the musculoskeletal system: Secondary | ICD-10-CM

## 2020-11-03 MED ORDER — PREDNISONE 20 MG PO TABS: 20.0000 mg | ORAL_TABLET | Freq: Every day | ORAL | 0 refills | Status: DC

## 2020-11-03 NOTE — Progress Notes (Signed)
Virtual Visit via Telephone Note  I connected with Sheryl [name] on 11/03/20 at 2:18am by telephone and verified that I am speaking with the correct person using two identifiers.  Location: Patient: at home Provider: in office   I discussed the limitations, risks, security and privacy concerns of performing an evaluation and management service by telephone and the availability of in person appointments. I also discussed with the patient that there may be a patient responsible charge related to this service. The patient expressed understanding and agreed to proceed.   History of Present Illness:  Her son tested positive on new years eve for COVID-19 About 3 days later she started having fatigue and lost of taste buds and congestion. Last night her tastebuds did improve. She has been trying to keep hydrated. Her appetite has been reduced in setting of not being able to eat since she cant taste anything and she feels week and shakey at times. No dizziness, no LOC, no falls, She has been trying to eat some applesauce She has been taking elderberry immune gummies and OTC cold/flu meds  She denies any UTI symptoms, no changes in bowels No sinus pressure but has some post nasal drip and mild cough. She doesn't feel SOB, but used her albuterol inhaler yesterday and felt better    Observations/Objective: No Vitals  Normal WOB on phone, alert and oriented, unable to visualize   Assessment and Plan: COVID-19 infection with residual fatigue, decreased appetite-discussed regarding her fatigue and shakiness that this is likely due to her not eating very much.  She is still taking all her medications but also unable to check her blood pressure so she is not sure if that has been running low as well.  Advised her to add boost or Ensure daily and now that her taste buds are back to increase her in general intake.  We will recommend she get off of soda and drink water for hydration.  She does have still  some residual cough and postnasal drip though this has significantly improved.  She states that she does not feel like she is wheezing or short of breath but yet her albuterol helps??.  I will give her prednisone for 5 days as we are heading into the weekend in the setting of pandemic and the winter storm she may not be able to get to a local emergency room if her breathing deteriorates. She is urinating normally and her bowel movements are normal so I suspect that her hydration is fairly good at this point.  She does have family members looking in on her and her son is there. She is now over her quarantine. Recommend she continue to wear her mask. Follow Up Instructions:    I discussed the assessment and treatment plan with the patient. The patient was provided an opportunity to ask questions and all were answered. The patient agreed with the plan and demonstrated an understanding of the instructions.   The patient was advised to call back or seek an in-person evaluation if the symptoms worsen or if the condition fails to improve as anticipated.  I provided 18 minutes of non-face-to-face time during this encounter. End Time 2:30pm  Vic Blackbird, MD

## 2020-12-13 ENCOUNTER — Other Ambulatory Visit: Payer: Self-pay

## 2020-12-13 ENCOUNTER — Encounter: Payer: Self-pay | Admitting: Nurse Practitioner

## 2020-12-13 ENCOUNTER — Ambulatory Visit (INDEPENDENT_AMBULATORY_CARE_PROVIDER_SITE_OTHER): Payer: Medicare Other | Admitting: Nurse Practitioner

## 2020-12-13 VITALS — BP 132/86 | HR 100 | Temp 97.3°F | Ht 62.0 in | Wt 253.2 lb

## 2020-12-13 DIAGNOSIS — N6310 Unspecified lump in the right breast, unspecified quadrant: Secondary | ICD-10-CM

## 2020-12-13 NOTE — Progress Notes (Signed)
Subjective:    Patient ID: Sheryl [name], female    DOB: Mar 25, 1956, 65 y.o.   MRN: [id number]  HPI: Sheryl [name] is a 65 y.o. female presenting for breast lump.  Chief Complaint  Patient presents with  . Mass    2 days ago believes she found lump in breast on right side, no pain, feels about the size of a pea   BREAST LUMP Duration: days Location: right breast Onset: sudden Painful: yes Quality: aching Severity: mild Alleviating factors: nothing tried Aggravating factors: pushing on it Discomfort: yes Status:  Not changing Trauma: no Redness: no Bruising: no Recent infection: no Swollen lymph nodes: no History of cancer: no Family history of cancer: no History of the same: yes; there is an area in the left breast that is being watched Associated signs and symptoms: no abnormal bleeding/bruising, no unintentional weight loss.  No Known Allergies  Outpatient Encounter Medications as of 12/13/2020  Medication Sig  . albuterol (VENTOLIN HFA) 108 (90 Base) MCG/ACT inhaler INHALE 2 PUFFS INTO THE LUNGS EVERY 4 HOURS AS NEEDED FOR WHEEZING OR SHORTNESS OF BREATH  . atorvastatin (LIPITOR) 10 MG tablet Take 1 tablet (10 mg total) by mouth at bedtime.  . clotrimazole-betamethasone (LOTRISONE) cream Apply 1 application topically 2 (two) times daily.  Marland Kitchen escitalopram (LEXAPRO) 10 MG tablet For mood  . hydrochlorothiazide (HYDRODIURIL) 25 MG tablet TAKE 1 TABLET BY MOUTH IN THE MORNING FOR BLOOD PRESSURE  . LORazepam (ATIVAN) 0.5 MG tablet Take 1 tablet (0.5 mg total) by mouth 2 (two) times daily as needed for anxiety.  . mirabegron ER (MYRBETRIQ) 25 MG TB24 tablet TAKE 1 TABLET BY MOUTH AT BEDTIME FOR YOUR BLADDER  . naproxen (NAPROSYN) 500 MG tablet Take 1 tablet (500 mg total) by mouth 2 (two) times daily with a meal.  . tiZANidine (ZANAFLEX) 4 MG tablet Take 1 tablet (4 mg total) by mouth every 8 (eight) hours as needed for muscle spasms.  . [DISCONTINUED] predniSONE  (DELTASONE) 20 MG tablet Take 1 tablet (20 mg total) by mouth daily with breakfast.   No facility-administered encounter medications on file as of 12/13/2020.    Patient Active Problem List   Diagnosis Date Noted  . Pain, dental 10/12/2020  . Hyperlipemia 09/07/2020  . Borderline diabetes 09/06/2020  . Bullous impetigo 07/26/2015  . Mixed incontinence urge and stress 07/25/2015  . Rotator cuff syndrome of left shoulder 12/19/2014  . Difficulty in walking(719.7) 05/06/2012  . Stiffness of vertebral column 05/06/2012  . Decreased strength 05/06/2012  . Class 3 obesity 04/16/2012  . MDD (major depressive disorder) 04/16/2012  . Essential hypertension, benign 04/16/2012  . Back pain 04/16/2012  . Cervical strain, acute 04/16/2012    Past Medical History:  Diagnosis Date  . Chronic back pain 2013  . Chronic neck pain 2013  . Depression   . Hypertension    Relevant past medical, surgical, family and social history reviewed and updated as indicated. Interim medical history since our last visit reviewed.  Review of Systems Per HPI unless specifically indicated above     Objective:    BP 132/86   Pulse 100   Temp (!) 97.3 F (36.3 C)   Ht 5\' 2"  (1.575 m)   Wt 253 lb 3.2 oz (114.9 kg)   LMP 12/20/2011   SpO2 100%   BMI 46.31 kg/m   Wt Readings from Last 3 Encounters:  12/13/20 253 lb 3.2 oz (114.9 kg)  09/06/20 257 lb (116.6  kg)  06/19/20 263 lb (119.3 kg)    Physical Exam Vitals and nursing note reviewed.  Constitutional:      General: She is not in acute distress.    Appearance: Normal appearance. She is obese. She is not toxic-appearing.  HENT:     Head: Normocephalic and atraumatic.  Eyes:     General: No scleral icterus.    Extraocular Movements: Extraocular movements intact.  Pulmonary:     Effort: Pulmonary effort is normal. No respiratory distress.  Chest:     Chest wall: No mass or tenderness.  Breasts:     Right: Mass present. No swelling, inverted  nipple, nipple discharge, skin change, tenderness, axillary adenopathy or supraclavicular adenopathy.     Left: Mass present. No inverted nipple, nipple discharge, skin change, tenderness, axillary adenopathy or supraclavicular adenopathy.      Comments: 0.5 cm x 0.5 cm rubbery, mobile mass.  Slightly tender to palpation.  No redness, bruising, swelling, fluctuance, discharge. Musculoskeletal:     Cervical back: Normal range of motion.  Lymphadenopathy:     Cervical: No cervical adenopathy.     Upper Body:     Right upper body: No supraclavicular or axillary adenopathy.     Left upper body: No supraclavicular or axillary adenopathy.  Skin:    General: Skin is warm and dry.     Capillary Refill: Capillary refill takes less than 2 seconds.     Coloration: Skin is not jaundiced or pale.     Findings: No erythema.  Neurological:     Mental Status: She is alert and oriented to person, place, and time.  Psychiatric:        Mood and Affect: Mood normal.        Behavior: Behavior normal.        Thought Content: Thought content normal.        Judgment: Judgment normal.       Assessment & Plan:  1. Breast mass, right Acute, ongoing.  Will obtain ultrasound of right breast.  Mammogram in December 2021 did not show a mass.  Encouraged patient to try to leave area alone and can apply ice or take Tylenol if uncomfortable.  - US BREAST COMPLETE UNI RIGHT INC AXILLA; Future    Follow up plan: Return if symptoms worsen or fail to improve.

## 2020-12-15 ENCOUNTER — Other Ambulatory Visit: Payer: Self-pay | Admitting: Nurse Practitioner

## 2020-12-15 ENCOUNTER — Other Ambulatory Visit: Payer: Self-pay

## 2020-12-15 DIAGNOSIS — N6310 Unspecified lump in the right breast, unspecified quadrant: Secondary | ICD-10-CM

## 2020-12-15 DIAGNOSIS — N649 Disorder of breast, unspecified: Secondary | ICD-10-CM

## 2020-12-29 DIAGNOSIS — Z23 Encounter for immunization: Secondary | ICD-10-CM | POA: Diagnosis not present

## 2021-01-08 ENCOUNTER — Ambulatory Visit: Payer: Medicare Other | Admitting: Family Medicine

## 2021-01-16 ENCOUNTER — Ambulatory Visit (HOSPITAL_COMMUNITY)
Admission: RE | Admit: 2021-01-16 | Discharge: 2021-01-16 | Disposition: A | Discharge status: Home / Self Care | Payer: Medicare Other | Source: Ambulatory Visit | Attending: Nurse Practitioner | Admitting: Nurse Practitioner

## 2021-01-16 DIAGNOSIS — N6310 Unspecified lump in the right breast, unspecified quadrant: Secondary | ICD-10-CM | POA: Insufficient documentation

## 2021-01-16 DIAGNOSIS — N6313 Unspecified lump in the right breast, lower outer quadrant: Secondary | ICD-10-CM | POA: Diagnosis not present

## 2021-01-16 DIAGNOSIS — N6311 Unspecified lump in the right breast, upper outer quadrant: Secondary | ICD-10-CM | POA: Diagnosis not present

## 2021-01-16 DIAGNOSIS — N649 Disorder of breast, unspecified: Secondary | ICD-10-CM | POA: Insufficient documentation

## 2021-01-16 DIAGNOSIS — R928 Other abnormal and inconclusive findings on diagnostic imaging of breast: Secondary | ICD-10-CM | POA: Diagnosis not present

## 2021-01-17 ENCOUNTER — Other Ambulatory Visit: Payer: Self-pay

## 2021-01-17 DIAGNOSIS — Z1231 Encounter for screening mammogram for malignant neoplasm of breast: Secondary | ICD-10-CM

## 2021-01-17 DIAGNOSIS — N6320 Unspecified lump in the left breast, unspecified quadrant: Secondary | ICD-10-CM

## 2021-01-26 DIAGNOSIS — Z23 Encounter for immunization: Secondary | ICD-10-CM | POA: Diagnosis not present

## 2021-02-15 ENCOUNTER — Other Ambulatory Visit: Payer: Self-pay | Admitting: Family Medicine

## 2021-04-09 ENCOUNTER — Other Ambulatory Visit (HOSPITAL_COMMUNITY): Payer: Self-pay | Admitting: Nurse Practitioner

## 2021-04-09 DIAGNOSIS — N63 Unspecified lump in unspecified breast: Secondary | ICD-10-CM

## 2021-04-25 ENCOUNTER — Ambulatory Visit (HOSPITAL_COMMUNITY)
Admission: RE | Admit: 2021-04-25 | Discharge: 2021-04-25 | Disposition: A | Discharge status: Home / Self Care | Payer: Medicare Other | Source: Ambulatory Visit | Attending: Nurse Practitioner | Admitting: Nurse Practitioner

## 2021-04-25 ENCOUNTER — Other Ambulatory Visit: Payer: Self-pay

## 2021-04-25 DIAGNOSIS — N6001 Solitary cyst of right breast: Secondary | ICD-10-CM | POA: Diagnosis not present

## 2021-04-25 DIAGNOSIS — N63 Unspecified lump in unspecified breast: Secondary | ICD-10-CM | POA: Diagnosis not present

## 2021-04-27 ENCOUNTER — Telehealth (INDEPENDENT_AMBULATORY_CARE_PROVIDER_SITE_OTHER): Payer: Medicare Other | Admitting: Nurse Practitioner

## 2021-04-27 ENCOUNTER — Other Ambulatory Visit: Payer: Self-pay

## 2021-04-27 ENCOUNTER — Encounter: Payer: Self-pay | Admitting: Nurse Practitioner

## 2021-04-27 DIAGNOSIS — E785 Hyperlipidemia, unspecified: Secondary | ICD-10-CM | POA: Diagnosis not present

## 2021-04-27 DIAGNOSIS — L304 Erythema intertrigo: Secondary | ICD-10-CM | POA: Diagnosis not present

## 2021-04-27 DIAGNOSIS — I10 Essential (primary) hypertension: Secondary | ICD-10-CM

## 2021-04-27 DIAGNOSIS — R7303 Prediabetes: Secondary | ICD-10-CM

## 2021-04-27 DIAGNOSIS — F332 Major depressive disorder, recurrent severe without psychotic features: Secondary | ICD-10-CM

## 2021-04-27 DIAGNOSIS — Z1211 Encounter for screening for malignant neoplasm of colon: Secondary | ICD-10-CM

## 2021-04-27 MED ORDER — ESCITALOPRAM OXALATE 10 MG PO TABS: ORAL_TABLET | ORAL | 2 refills | Status: DC

## 2021-04-27 MED ORDER — ATORVASTATIN CALCIUM 10 MG PO TABS
10.0000 mg | ORAL_TABLET | Freq: Every day | ORAL | 3 refills | Status: AC
Start: 2021-04-27 — End: ?

## 2021-04-27 MED ORDER — CLOTRIMAZOLE-BETAMETHASONE 1-0.05 % EX CREA: 1.0000 "application " | TOPICAL_CREAM | Freq: Two times a day (BID) | CUTANEOUS | 3 refills | Status: AC

## 2021-04-27 NOTE — Progress Notes (Signed)
Subjective:    Patient ID: Sheryl [name], female    DOB: June 25, 1956, 65 y.o.   MRN: [id number]  HPI: Sheryl [name] is a 65 y.o. female presenting virtually for follow up.  Chief Complaint  Patient presents with   Follow-up    Imaging done yesterday on right breast   Anxiety   Medication Refill    Antivan, lexapro, lotrisone   HYPERTENSION / [place] Patient is not checking her blood pressure at home.  Reports good compliance with atorvastatin 10 mg daily for cholesterol, and hydrochlorothiazide 25 mg daily for high blood pressure. Satisfied with current treatment? yes Duration of hypertension: chronic BP monitoring frequency: not checking BP medication side effects: no Duration of hyperlipidemia: chronic Cholesterol medication side effects: no Cholesterol supplements: None Aspirin: no Recent stressors: yes Recurrent headaches: no Visual changes: no - eyes are getting "weak"; overdue to see an Eye doctor  Palpitations: no Dyspnea: no Chest pain: no Lower extremity edema: no Dizzy/lightheaded: no  DEPRESSION Not taking lexapro daily - taking as needed.  Has been feeling down.  Requesting refill of Ativan.  Reports difficulty since the passing of her mother and sister.  Has support from her children. Mood status: uncontrolled Satisfied with current treatment?: no Symptom severity: moderate  Duration of current treatment : chronic Side effects: no Medication compliance: poor compliance Psychotherapy/counseling: no Depressed mood: yes Anxious mood: no Anhedonia: yes Significant weight loss or gain: no Insomnia: yes Fatigue: no Feelings of worthlessness or guilt: no Impaired concentration/indecisiveness: no Suicidal ideations: no Hopelessness: no Crying spells: no  PRE-DIABETES Hypoglycemic episodes:no Polydipsia/polyuria: no Visual disturbance: no Chest pain: no Paresthesias: no Glucose Monitoring: no  No Known Allergies  Outpatient  Encounter Medications as of 04/27/2021  Medication Sig Note   albuterol (VENTOLIN HFA) 108 (90 Base) MCG/ACT inhaler INHALE 2 PUFFS INTO THE LUNGS EVERY 4 HOURS AS NEEDED FOR WHEEZING OR SHORTNESS OF BREATH    hydrochlorothiazide (HYDRODIURIL) 25 MG tablet TAKE 1 TABLET BY MOUTH IN THE MORNING FOR BLOOD PRESSURE    mirabegron ER (MYRBETRIQ) 25 MG TB24 tablet TAKE 1 TABLET BY MOUTH AT BEDTIME FOR YOUR BLADDER    [DISCONTINUED] atorvastatin (LIPITOR) 10 MG tablet Take 1 tablet (10 mg total) by mouth at bedtime.    [DISCONTINUED] clotrimazole-betamethasone (LOTRISONE) cream Apply 1 application topically 2 (two) times daily.    [DISCONTINUED] escitalopram (LEXAPRO) 10 MG tablet For mood    [DISCONTINUED] LORazepam (ATIVAN) 0.5 MG tablet Take 1 tablet (0.5 mg total) by mouth 2 (two) times daily as needed for anxiety.    [DISCONTINUED] tiZANidine (ZANAFLEX) 4 MG tablet Take 1 tablet (4 mg total) by mouth every 8 (eight) hours as needed for muscle spasms.    atorvastatin (LIPITOR) 10 MG tablet Take 1 tablet (10 mg total) by mouth at bedtime.    clotrimazole-betamethasone (LOTRISONE) cream Apply 1 application topically 2 (two) times daily.    escitalopram (LEXAPRO) 10 MG tablet For mood    [DISCONTINUED] naproxen (NAPROSYN) 500 MG tablet Take 1 tablet (500 mg total) by mouth 2 (two) times daily with a meal. (Patient not taking: Reported on 04/27/2021) 04/27/2021: prn   No facility-administered encounter medications on file as of 04/27/2021.    Patient Active Problem List   Diagnosis Date Noted   Pain, dental 10/12/2020   Hyperlipemia 09/07/2020   Borderline diabetes 09/06/2020   Bullous impetigo 07/26/2015   Mixed incontinence urge and stress 07/25/2015   Rotator cuff syndrome of left shoulder 12/19/2014  Difficulty in walking(719.7) 05/06/2012   Stiffness of vertebral column 05/06/2012   Decreased strength 05/06/2012   Class 3 obesity 04/16/2012   MDD (major depressive disorder) 04/16/2012    Essential hypertension, benign 04/16/2012   Back pain 04/16/2012   Cervical strain, acute 04/16/2012    Past Medical History:  Diagnosis Date   Chronic back pain 2013   Chronic neck pain 2013   Depression    Hypertension     Relevant past medical, surgical, family and social history reviewed and updated as indicated. Interim medical history since our last visit reviewed.  Review of Systems Per HPI unless specifically indicated above     Objective:    LMP 12/20/2011   Wt Readings from Last 3 Encounters:  12/13/20 253 lb 3.2 oz (114.9 kg)  09/06/20 257 lb (116.6 kg)  06/19/20 263 lb (119.3 kg)    Physical Exam Physical examination unable to be performed due to lack of equipment.  Patient talking in complete sentences during telemedicine visit.     Assessment & Plan:   Problem List Items Addressed This Visit       Cardiovascular and Mediastinum   Essential hypertension, benign - Primary    Chronic.  Patient does not check blood pressure at home, therefore I do not know if she is at goal.  I encouraged the patient to check her blood pressure at home and call us with readings in the next week.  Also, she should stop by our clinic to have fasting labs drawn to check her electrolytes and kidney function.  Plan to follow-up in 3 months in person.       Relevant Medications   atorvastatin (LIPITOR) 10 MG tablet   Other Relevant Orders   COMPLETE METABOLIC PANEL WITH GFR   CBC with Differential/Platelet   Lipid panel   TSH     Other   MDD (major depressive disorder)    Chronic.  Patient is not taking Lexapro daily and is instead requesting for refill of Ativan.  Discussed at length that Ativan is to be used for rescue purposes and Lexapro is to be daily medication.  We will plan to restart daily medication-Lexapro 10 mg and hold off on refilling Ativan for now.  Patient in agreement.  Plan to follow-up in 3 months.       Relevant Medications   escitalopram (LEXAPRO) 10  MG tablet   Other Relevant Orders   CBC with Differential/Platelet   Hyperlipemia    Chronic.  Encouraged patient to return to clinic to check fasting lipid panel.  Plan to continue atorvastatin 10 mg daily for now.  We will also recheck liver enzymes when patient returns for blood work.  Follow-up in 3 months.       Relevant Medications   atorvastatin (LIPITOR) 10 MG tablet   Other Relevant Orders   COMPLETE METABOLIC PANEL WITH GFR   CBC with Differential/Platelet   Lipid panel   TSH   Hemoglobin A1c   Borderline diabetes    Chronic.  We will recheck hemoglobin A1c when patient returns for fasting lab work.  Discussed limiting intake of simple carbohydrates and sugars.  Follow-up in 3 months.       Relevant Medications   atorvastatin (LIPITOR) 10 MG tablet   Other Relevant Orders   Hemoglobin A1c   Other Visit Diagnoses     Screening for colon cancer       Relevant Orders   Ambulatory referral to Gastroenterology   Intertrigo  Relevant Medications   clotrimazole-betamethasone (LOTRISONE) cream        Follow up plan: Return in about 3 months (around 07/28/2021) for follow up.  This visit was completed via telephone due to the restrictions of the COVID-19 pandemic. All issues as above were discussed and addressed but no physical exam was performed. If it was felt that the patient should be evaluated in the office, they were directed there. The patient verbally consented to this visit. Patient was unable to complete an audio/visual visit due to Technical difficulties.  Patient was unable to get her phone working to do vidoe visit. Location of the patient: home Location of the provider: work Those involved with this call:  Provider: Noemi Chapel, DNP, FNP-C CMA: Annabelle Harman, CMA Front Desk/Registration: Santina Evans  Time spent on call:  15 minutes on the phone discussing health concerns. 17 minutes total spent in review of patient's record and preparation of  their chart. I verified patient identity using two factors (patient name and date of birth). Patient consents verbally to being seen via telemedicine visit today.

## 2021-04-27 NOTE — Assessment & Plan Note (Signed)
Chronic.  Encouraged patient to return to clinic to check fasting lipid panel.  Plan to continue atorvastatin 10 mg daily for now.  We will also recheck liver enzymes when patient returns for blood work.  Follow-up in 3 months.

## 2021-04-27 NOTE — Assessment & Plan Note (Signed)
Chronic.  Patient is not taking Lexapro daily and is instead requesting for refill of Ativan.  Discussed at length that Ativan is to be used for rescue purposes and Lexapro is to be daily medication.  We will plan to restart daily medication-Lexapro 10 mg and hold off on refilling Ativan for now.  Patient in agreement.  Plan to follow-up in 3 months.

## 2021-04-27 NOTE — Assessment & Plan Note (Signed)
Chronic.  Patient does not check blood pressure at home, therefore I do not know if she is at goal.  I encouraged the patient to check her blood pressure at home and call us with readings in the next week.  Also, she should stop by our clinic to have fasting labs drawn to check her electrolytes and kidney function.  Plan to follow-up in 3 months in person.

## 2021-04-27 NOTE — Assessment & Plan Note (Signed)
Chronic.  We will recheck hemoglobin A1c when patient returns for fasting lab work.  Discussed limiting intake of simple carbohydrates and sugars.  Follow-up in 3 months.

## 2021-05-02 ENCOUNTER — Encounter: Payer: Self-pay | Admitting: Internal Medicine

## 2021-07-24 ENCOUNTER — Telehealth: Payer: Self-pay | Admitting: *Deleted

## 2021-07-24 NOTE — Chronic Care Management (AMB) (Signed)
  Chronic Care Management   Outreach Note  07/24/2021 Name: Sheryl [name] MRN: [id number] DOB: 05-Apr-1956  Sheryl [name] is a 65 y.o. year old female who is a primary care patient of Eulogio Bear, NP. I reached out to Sheryl [name] by phone today in response to a referral sent by Sheryl [name]'s primary care provider.  An unsuccessful telephone outreach was attempted today. The patient was referred to the case management team for assistance with care management and care coordination.   Follow Up Plan: A HIPAA compliant phone message was left for the patient providing contact information and requesting a return call. The care management team will reach out to the patient again over the next 7 days.  If patient returns call to provider office, please advise to call [place] at [phone].  [facility]  Direct Dial: [phone]

## 2021-07-31 DIAGNOSIS — Z23 Encounter for immunization: Secondary | ICD-10-CM | POA: Diagnosis not present

## 2021-07-31 NOTE — Chronic Care Management (AMB) (Signed)
  Chronic Care Management   Outreach Note  07/31/2021 Name: ROSALITA CAREY MRN: [id number] DOB: 08/11/1956  SABINE TENENBAUM is a 65 y.o. year old female who is a primary care patient of Eulogio Bear, NP. I reached out to Claudell Kyle by phone today in response to a referral sent by Ms. Chyrl Civatte Dolinger's primary care provider.  A second unsuccessful telephone outreach was attempted today. The patient was referred to the case management team for assistance with care management and care coordination.   Follow Up Plan: A HIPAA compliant phone message was left for the patient providing contact information and requesting a return call. The care management team will reach out to the patient again over the next 7 days. If patient returns call to provider office, please advise to call [place] at [phone].  [facility]  Direct Dial: [phone]

## 2021-08-06 ENCOUNTER — Ambulatory Visit: Payer: Medicare Other | Admitting: Nurse Practitioner

## 2021-08-06 NOTE — Progress Notes (Deleted)
Subjective:    Patient ID: Sheryl [name], female    DOB: 1956/04/27, 65 y.o.   MRN: [id number]  HPI: Sheryl [name] is a 65 y.o. female presenting for  No chief complaint on file.  HYPERTENSION / HYPERLIPIDEMIA BP monitoring frequency: {Blank single:19197::"not checking","rarely","daily","weekly","monthly","a few times a day","a few times a week","a few times a month"} BP range:  Aspirin: {Blank single:19197::"yes","no"} Recent stressors: {Blank single:19197::"yes","no"} Recurrent headaches: {Blank single:19197::"yes","no"} Visual changes: {Blank single:19197::"yes","no"} Palpitations: {Blank single:19197::"yes","no"} Dyspnea: {Blank single:19197::"yes","no"} Chest pain: {Blank single:19197::"yes","no"} Lower extremity edema: {Blank single:19197::"yes","no"} Dizzy/lightheaded: {Blank single:19197::"yes","no"} Myalgias: {Blank single:19197::"yes","no"} The 10-year ASCVD risk score (Arnett DK, et al., 2019) is: 9%   Values used to calculate the score:     Age: 78 years     Sex: Female     Is Non-Hispanic African American: Yes     Diabetic: No     Tobacco smoker: No     Systolic Blood Pressure: 878 mmHg     Is BP treated: Yes     HDL Cholesterol: 43 mg/dL     Total Cholesterol: 185 mg/dL   DEPRESSION Mood status: {Blank single:19197::"controlled","uncontrolled","better","worse","exacerbated","stable"} Satisfied with current treatment?: {Blank single:19197::"yes","no"} Symptom severity: {Blank single:19197::"mild","moderate","severe"}  Duration of current treatment : {Blank single:19197::"chronic","months","years"} Side effects: {Blank single:19197::"yes","no"} Medication compliance: {Blank single:19197::"excellent compliance","good compliance","fair compliance","poor compliance"} Psychotherapy/counseling: {Blank single:19197::"yes","no"} Previous psychiatric medications: Depressed mood: {Blank single:19197::"yes","no"} Anxious mood: {Blank  single:19197::"yes","no"} Anhedonia: {Blank single:19197::"yes","no"} Significant weight loss or gain: {Blank single:19197::"yes","no"} Insomnia: {Blank single:19197::"yes","no"} Fatigue: {Blank single:19197::"yes","no"} Feelings of worthlessness or guilt: {Blank single:19197::"yes","no"} Impaired concentration/indecisiveness: {Blank single:19197::"yes","no"} Suicidal ideations: {Blank single:19197::"yes","no"} Hopelessness: {Blank single:19197::"yes","no"} Crying spells: {Blank single:19197::"yes","no"} Depression screen [facility] 2/9 09/06/2020 08/18/2019 08/18/2019 05/06/2018 01/27/2017  Decreased Interest 0 0 0 2 1  Down, Depressed, Hopeless 0 0 0 2 1  PHQ - 2 Score 0 0 0 4 2  Altered sleeping - 2 - 2 1  Tired, decreased energy - 2 - 2 1  Change in appetite - 0 - 0 1  Feeling bad or failure about yourself  - 0 - 1 0  Trouble concentrating - 0 - 1 1  Moving slowly or fidgety/restless - 0 - 0 0  Suicidal thoughts - 0 - 0 0  PHQ-9 Score - 4 - 10 6  Difficult doing work/chores - Not difficult at all - Somewhat difficult Somewhat difficult    No flowsheet data found.  PRE-DIABETES Hypoglycemic episodes:{Blank single:19197::"yes","no"} Polydipsia/polyuria: {Blank single:19197::"yes","no"} Visual disturbance: {Blank single:19197::"yes","no"} Chest pain: {Blank single:19197::"yes","no"} Paresthesias: {Blank single:19197::"yes","no"} Glucose Monitoring: {Blank single:19197::"yes","no"}  Accucheck frequency:  Fasting glucose:  Post prandial:  Evening:  Before meals: Diabetic Education: {Blank single:19197::"Completed","Not Completed"} Physical activity: Dietary habits:   No Known Allergies  Outpatient Encounter Medications as of 08/06/2021  Medication Sig   albuterol (VENTOLIN HFA) 108 (90 Base) MCG/ACT inhaler INHALE 2 PUFFS INTO THE LUNGS EVERY 4 HOURS AS NEEDED FOR WHEEZING OR SHORTNESS OF BREATH   atorvastatin (LIPITOR) 10 MG tablet Take 1 tablet (10 mg total) by mouth at  bedtime.   clotrimazole-betamethasone (LOTRISONE) cream Apply 1 application topically 2 (two) times daily.   escitalopram (LEXAPRO) 10 MG tablet For mood   hydrochlorothiazide (HYDRODIURIL) 25 MG tablet TAKE 1 TABLET BY MOUTH IN THE MORNING FOR BLOOD PRESSURE   mirabegron ER (MYRBETRIQ) 25 MG TB24 tablet TAKE 1 TABLET BY MOUTH AT BEDTIME FOR YOUR BLADDER   No facility-administered encounter medications on file as of 08/06/2021.    Patient Active Problem List   Diagnosis Date Noted  Pain, dental 10/12/2020   Hyperlipemia 09/07/2020   Borderline diabetes 09/06/2020   Bullous impetigo 07/26/2015   Mixed incontinence urge and stress 07/25/2015   Rotator cuff syndrome of left shoulder 12/19/2014   Difficulty in walking(719.7) 05/06/2012   Stiffness of vertebral column 05/06/2012   Decreased strength 05/06/2012   Class 3 obesity ([place]) 04/16/2012   MDD (major depressive disorder) 04/16/2012   Essential hypertension, benign 04/16/2012   Back pain 04/16/2012   Cervical strain, acute 04/16/2012    Past Medical History:  Diagnosis Date   Chronic back pain 2013   Chronic neck pain 2013   Depression    Hypertension     Relevant past medical, surgical, family and social history reviewed and updated as indicated. Interim medical history since our last visit reviewed.  Review of Systems Per HPI unless specifically indicated above     Objective:    LMP 12/20/2011   Wt Readings from Last 3 Encounters:  12/13/20 253 lb 3.2 oz (114.9 kg)  09/06/20 257 lb (116.6 kg)  06/19/20 263 lb (119.3 kg)    Physical Exam  Results for orders placed or performed in visit on 09/06/20  CBC with Differential/Platelet  Result Value Ref Range   WBC 6.2 3.8 - 10.8 Thousand/uL   RBC 5.07 3.80 - 5.10 Million/uL   Hemoglobin 14.8 11.7 - 15.5 g/dL   HCT 44.7 35.0 - 45.0 %   MCV 88.2 80.0 - 100.0 fL   MCH 29.2 27.0 - 33.0 pg   MCHC 33.1 32.0 - 36.0 g/dL   RDW 12.6 11.0 - 15.0 %   Platelets 277 140  - 400 Thousand/uL   MPV 9.9 7.5 - 12.5 fL   Neutro Abs 4,117 1,500 - 7,800 cells/uL   Lymphs Abs 1,680 850 - 3,900 cells/uL   Absolute Monocytes 347 200 - 950 cells/uL   Eosinophils Absolute 19 15 - 500 cells/uL   Basophils Absolute 37 0 - 200 cells/uL   Neutrophils Relative % 66.4 %   Total Lymphocyte 27.1 %   Monocytes Relative 5.6 %   Eosinophils Relative 0.3 %   Basophils Relative 0.6 %  Comprehensive metabolic panel  Result Value Ref Range   Glucose, Bld 102 (H) 65 - 99 mg/dL   BUN 14 7 - 25 mg/dL   Creat 1.17 (H) 0.50 - 0.99 mg/dL   BUN/Creatinine Ratio 12 6 - 22 (calc)   Sodium 140 135 - 146 mmol/L   Potassium 3.8 3.5 - 5.3 mmol/L   Chloride 101 98 - 110 mmol/L   CO2 29 20 - 32 mmol/L   Calcium 9.4 8.6 - 10.4 mg/dL   Total Protein 7.4 6.1 - 8.1 g/dL   Albumin 4.1 3.6 - 5.1 g/dL   Globulin 3.3 1.9 - 3.7 g/dL (calc)   AG Ratio 1.2 1.0 - 2.5 (calc)   Total Bilirubin 0.4 0.2 - 1.2 mg/dL   Alkaline phosphatase (APISO) 101 37 - 153 U/L   AST 20 10 - 35 U/L   ALT 13 6 - 29 U/L  Lipid panel  Result Value Ref Range   Cholesterol 185 <200 mg/dL   HDL 43 (L) > OR = 50 mg/dL   Triglycerides 80 <150 mg/dL   LDL Cholesterol (Calc) 125 (H) mg/dL (calc)   Total CHOL/HDL Ratio 4.3 <5.0 (calc)   Non-HDL Cholesterol (Calc) 142 (H) <130 mg/dL (calc)  Hemoglobin A1c  Result Value Ref Range   Hgb A1c MFr Bld 6.0 (H) <5.7 % of total Hgb  Mean Plasma Glucose 126 (calc)   eAG (mmol/L) 7.0 (calc)      Assessment & Plan:   Problem List Items Addressed This Visit   None    Follow up plan: No follow-ups on file.

## 2021-08-07 NOTE — Progress Notes (Signed)
Patient has a new PCP at [facility] closing encounter

## 2021-08-10 ENCOUNTER — Encounter: Payer: Self-pay | Admitting: Nurse Practitioner

## 2021-08-10 ENCOUNTER — Other Ambulatory Visit: Payer: Self-pay

## 2021-08-10 ENCOUNTER — Ambulatory Visit (INDEPENDENT_AMBULATORY_CARE_PROVIDER_SITE_OTHER): Payer: Medicare Other | Admitting: Nurse Practitioner

## 2021-08-10 VITALS — BP 132/80 | HR 71 | Temp 99.9°F | Resp 20 | Ht 62.0 in | Wt 257.0 lb

## 2021-08-10 DIAGNOSIS — I10 Essential (primary) hypertension: Secondary | ICD-10-CM | POA: Diagnosis not present

## 2021-08-10 DIAGNOSIS — R7303 Prediabetes: Secondary | ICD-10-CM

## 2021-08-10 DIAGNOSIS — E785 Hyperlipidemia, unspecified: Secondary | ICD-10-CM | POA: Diagnosis not present

## 2021-08-10 DIAGNOSIS — F332 Major depressive disorder, recurrent severe without psychotic features: Secondary | ICD-10-CM | POA: Diagnosis not present

## 2021-08-10 DIAGNOSIS — Z23 Encounter for immunization: Secondary | ICD-10-CM | POA: Diagnosis not present

## 2021-08-10 NOTE — Assessment & Plan Note (Signed)
Chronic.  BP improved on recheck today.  Goal is less than 130/80 and she admits she does not take her BP medication consistently.  Encouraged adherence to HCTZ 25 mg daily.  Check CBC and electrolytes with kidney function/liver enzymes today.  Follow up in 3 months.

## 2021-08-10 NOTE — Assessment & Plan Note (Signed)
Chronic.  PHQ-9 is not elevated today.  NO SI/HI.  Continue Lexapro 10 mg daily.  Patient declines starting to see Therapist for now, but will be open to seeing one in the future.  Follow up in 3 months.

## 2021-08-10 NOTE — Assessment & Plan Note (Addendum)
Chronic.  Check HgbA1c today.  Discussed dietary and lifestyle changes.  Encouraged increasing physical activity; goal is 30 minutes 5 times weekly.  Follow up pending blood work or in 3 months.

## 2021-08-10 NOTE — Progress Notes (Signed)
Subjective:    Patient ID: Sheryl [name], female    DOB: 03/23/1956, 65 y.o.   MRN: [id number]  HPI: Sheryl [name] is a 65 y.o. female presenting for follow up.  Chief Complaint  Patient presents with   Hypertension    Check up, Flu Shot   HYPERTENSION / HYPERLIPIDEMIA Taking HCTZ and atorvastatin "off and on."  She is struggling from the stress of her sick family members and often forgets to take her medication.  She is not fasting today.  BP monitoring frequency: not checking Aspirin: no Recent stressors: no Recurrent headaches: no Visual changes: no Palpitations: no Dyspnea: no Chest pain: no Lower extremity edema: no Dizzy/lightheaded: no Myalgias: no The 10-year ASCVD risk score (Arnett DK, et al., 2019) is: 9%   Values used to calculate the score:     Age: 46 years     Sex: Female     Is Non-Hispanic African American: Yes     Diabetic: No     Tobacco smoker: No     Systolic Blood Pressure: 174 mmHg     Is BP treated: Yes     HDL Cholesterol: 43 mg/dL     Total Cholesterol: 185 mg/dL  DEPRESSION Reports it has been really hard since losing her mother and sister.  She is trying to care for her other 2 sisters.  She has also been trying to talk with her son, but tells me it is hard to stay calm when he is anxious.  She has talked with a Therapist in the past, but is not interested at this time. She is taking Lexapro 10 mg hit or miss, overall thinks it is helping her.    Depression screen [facility] 2/9 08/10/2021 09/06/2020 08/18/2019 08/18/2019 05/06/2018  Decreased Interest 1 0 0 0 2  Down, Depressed, Hopeless 0 0 0 0 2  PHQ - 2 Score 1 0 0 0 4  Altered sleeping 0 - 2 - 2  Tired, decreased energy 1 - 2 - 2  Change in appetite 0 - 0 - 0  Feeling bad or failure about yourself  0 - 0 - 1  Trouble concentrating 0 - 0 - 1  Moving slowly or fidgety/restless 0 - 0 - 0  Suicidal thoughts 0 - 0 - 0  PHQ-9 Score 2 - 4 - 10  Difficult doing work/chores Not difficult  at all - Not difficult at all - Somewhat difficult   PRE-DIABETES Not currently taking any medication.   Hypoglycemic episodes:no Polydipsia/polyuria: no Visual disturbance: no Chest pain: no Paresthesias: no Glucose Monitoring: no Physical activity: not active  No Known Allergies  Outpatient Encounter Medications as of 08/10/2021  Medication Sig   albuterol (VENTOLIN HFA) 108 (90 Base) MCG/ACT inhaler INHALE 2 PUFFS INTO THE LUNGS EVERY 4 HOURS AS NEEDED FOR WHEEZING OR SHORTNESS OF BREATH   atorvastatin (LIPITOR) 10 MG tablet Take 1 tablet (10 mg total) by mouth at bedtime.   clotrimazole-betamethasone (LOTRISONE) cream Apply 1 application topically 2 (two) times daily.   escitalopram (LEXAPRO) 10 MG tablet For mood   hydrochlorothiazide (HYDRODIURIL) 25 MG tablet TAKE 1 TABLET BY MOUTH IN THE MORNING FOR BLOOD PRESSURE   mirabegron ER (MYRBETRIQ) 25 MG TB24 tablet TAKE 1 TABLET BY MOUTH AT BEDTIME FOR YOUR BLADDER   No facility-administered encounter medications on file as of 08/10/2021.    Patient Active Problem List   Diagnosis Date Noted   Pain, dental 10/12/2020   Hyperlipemia 09/07/2020  Borderline diabetes 09/06/2020   Bullous impetigo 07/26/2015   Mixed incontinence urge and stress 07/25/2015   Rotator cuff syndrome of left shoulder 12/19/2014   Difficulty in walking(719.7) 05/06/2012   Stiffness of vertebral column 05/06/2012   Decreased strength 05/06/2012   Class 3 obesity ([place]) 04/16/2012   MDD (major depressive disorder) 04/16/2012   Essential hypertension, benign 04/16/2012   Back pain 04/16/2012   Cervical strain, acute 04/16/2012    Past Medical History:  Diagnosis Date   Chronic back pain 2013   Chronic neck pain 2013   Depression    Hypertension     Relevant past medical, surgical, family and social history reviewed and updated as indicated. Interim medical history since our last visit reviewed.  Review of Systems Per HPI unless specifically  indicated above     Objective:    BP 132/80 (BP Location: Left Arm, Cuff Size: Large)   Pulse 71   Temp 99.9 F (37.7 C)   Resp 20   Ht 5\' 2"  (1.575 m)   Wt 257 lb (116.6 kg)   LMP 12/20/2011   SpO2 97%   BMI 47.01 kg/m   Wt Readings from Last 3 Encounters:  08/10/21 257 lb (116.6 kg)  12/13/20 253 lb 3.2 oz (114.9 kg)  09/06/20 257 lb (116.6 kg)    Physical Exam Vitals and nursing note reviewed.  Constitutional:      General: She is not in acute distress.    Appearance: Normal appearance. She is obese. She is not toxic-appearing.  Eyes:     General: No scleral icterus.    Extraocular Movements: Extraocular movements intact.  Cardiovascular:     Rate and Rhythm: Normal rate and regular rhythm.     Heart sounds: Normal heart sounds. No murmur heard. Pulmonary:     Effort: Pulmonary effort is normal. No respiratory distress.     Breath sounds: Normal breath sounds. No wheezing, rhonchi or rales.  Abdominal:     General: Abdomen is flat. Bowel sounds are normal.     Palpations: Abdomen is soft.     Tenderness: There is no abdominal tenderness.  Musculoskeletal:     Right lower leg: No edema.     Left lower leg: No edema.  Skin:    General: Skin is warm and dry.     Capillary Refill: Capillary refill takes less than 2 seconds.     Coloration: Skin is not jaundiced.     Findings: No bruising.  Neurological:     Mental Status: She is alert and oriented to person, place, and time.     Gait: Gait normal.  Psychiatric:        Mood and Affect: Mood normal.        Behavior: Behavior normal.        Thought Content: Thought content normal.        Judgment: Judgment normal.      Assessment & Plan:   Problem List Items Addressed This Visit       Cardiovascular and Mediastinum   Essential hypertension, benign    Chronic.  BP improved on recheck today.  Goal is less than 130/80 and she admits she does not take her BP medication consistently.  Encouraged adherence to  HCTZ 25 mg daily.  Check CBC and electrolytes with kidney function/liver enzymes today.  Follow up in 3 months.       Relevant Orders   CBC with Differential/Platelet   COMPLETE METABOLIC PANEL WITH GFR  Other   MDD (major depressive disorder) - Primary    Chronic.  PHQ-9 is not elevated today.  NO SI/HI.  Continue Lexapro 10 mg daily.  Patient declines starting to see Therapist for now, but will be open to seeing one in the future.  Follow up in 3 months.       Hyperlipemia    Chronic. Will check lipids today although patient is not fasting and she has not been taking atorvastatin regularly.  I encouraged compliance with medication regimen.  Follow up in 3 months.       Relevant Orders   Lipid panel   CBC with Differential/Platelet   COMPLETE METABOLIC PANEL WITH GFR   Borderline diabetes    Chronic.  Check HgbA1c today.  Discussed dietary and lifestyle changes.  Encouraged increasing physical activity; goal is 30 minutes 5 times weekly.  Follow up pending blood work or in 3 months.       Relevant Orders   Hemoglobin A1c   Other Visit Diagnoses     Need for immunization against influenza       Relevant Orders   Flu Vaccine QUAD 61mo+IM (Fluarix, Fluzone & Alfiuria Quad PF) (Completed)        Follow up plan: Return in about 3 months (around 11/10/2021) for Wellness with pap.

## 2021-08-10 NOTE — Assessment & Plan Note (Signed)
Chronic. Will check lipids today although patient is not fasting and she has not been taking atorvastatin regularly.  I encouraged compliance with medication regimen.  Follow up in 3 months.

## 2021-08-11 LAB — CBC WITH DIFFERENTIAL/PLATELET
Absolute Monocytes: 371 cells/uL (ref 200–950)
Basophils Absolute: 51 cells/uL (ref 0–200)
Basophils Relative: 0.8 %
Eosinophils Absolute: 32 cells/uL (ref 15–500)
Eosinophils Relative: 0.5 %
HCT: 43 % (ref 35.0–45.0)
Hemoglobin: 14.2 g/dL (ref 11.7–15.5)
Lymphs Abs: 1894 cells/uL (ref 850–3900)
MCH: 28.6 pg (ref 27.0–33.0)
MCHC: 33 g/dL (ref 32.0–36.0)
MCV: 86.7 fL (ref 80.0–100.0)
MPV: 9.7 fL (ref 7.5–12.5)
Monocytes Relative: 5.8 %
Neutro Abs: 4051 cells/uL (ref 1500–7800)
Neutrophils Relative %: 63.3 %
Platelets: 256 10*3/uL (ref 140–400)
RBC: 4.96 10*6/uL (ref 3.80–5.10)
RDW: 12.7 % (ref 11.0–15.0)
Total Lymphocyte: 29.6 %
WBC: 6.4 10*3/uL (ref 3.8–10.8)

## 2021-08-11 LAB — COMPLETE METABOLIC PANEL WITH GFR
AG Ratio: 1.3 (calc) (ref 1.0–2.5)
ALT: 15 U/L (ref 6–29)
AST: 18 U/L (ref 10–35)
Albumin: 3.9 g/dL (ref 3.6–5.1)
Alkaline phosphatase (APISO): 108 U/L (ref 37–153)
BUN/Creatinine Ratio: 12 (calc) (ref 6–22)
BUN: 13 mg/dL (ref 7–25)
CO2: 29 mmol/L (ref 20–32)
Calcium: 9.5 mg/dL (ref 8.6–10.4)
Chloride: 104 mmol/L (ref 98–110)
Creat: 1.09 mg/dL — ABNORMAL HIGH (ref 0.50–1.05)
Globulin: 3.1 g/dL (calc) (ref 1.9–3.7)
Glucose, Bld: 96 mg/dL (ref 65–99)
Potassium: 4.4 mmol/L (ref 3.5–5.3)
Sodium: 141 mmol/L (ref 135–146)
Total Bilirubin: 0.3 mg/dL (ref 0.2–1.2)
Total Protein: 7 g/dL (ref 6.1–8.1)
eGFR: 57 mL/min/{1.73_m2} — ABNORMAL LOW (ref >=60)

## 2021-08-11 LAB — LIPID PANEL
Cholesterol: 197 mg/dL (ref <200)
HDL: 50 mg/dL (ref >=50)
LDL Cholesterol (Calc): 129 mg/dL (calc) — ABNORMAL HIGH
Non-HDL Cholesterol (Calc): 147 mg/dL (calc) — ABNORMAL HIGH (ref <130)
Total CHOL/HDL Ratio: 3.9 (calc) (ref <5.0)
Triglycerides: 83 mg/dL (ref <150)

## 2021-08-11 LAB — HEMOGLOBIN A1C
Hgb A1c MFr Bld: 6 % of total Hgb — ABNORMAL HIGH (ref <5.7)
Mean Plasma Glucose: 126 mg/dL
eAG (mmol/L): 7 mmol/L

## 2021-09-01 NOTE — Progress Notes (Signed)
Referring Provider: Eulogio Bear, NP Primary Care Physician:  Eulogio Bear, NP Primary Gastroenterologist:  Dr. Gala Romney  Chief Complaint  Patient presents with   Colonoscopy    Last tcs >10 yrs ago. No fhcrc. No problems    HPI:   Sheryl [name] is a 65 y.o. female presenting today at the request of Eulogio Bear, NP for colon cancer screening.  Office visit due to BMI.  Patient reports her last colonoscopy was more than 10 years ago at [facility].  Thinks she may have had a polyp, but she is not sure.  FHx Colon cancer: None.   No GI concerns today.  Denies abdominal pain, constipation, diarrhea, BRBPR, melena, unintentional weight loss, nausea, vomiting, GERD symptoms, dysphagia.  Occasional use of BC powders for headaches.  Fairly rare.  Past Medical History:  Diagnosis Date   Chronic back pain 10/22/2011   Chronic neck pain 10/22/2011   Depression    HLD (hyperlipidemia)    Hypertension     Past Surgical History:  Procedure Laterality Date   BREAST CYST EXCISION     right   COLONOSCOPY     per patient, many years ago at [place]      Current Outpatient Medications  Medication Sig Dispense Refill   albuterol (VENTOLIN HFA) 108 (90 Base) MCG/ACT inhaler INHALE 2 PUFFS INTO THE LUNGS EVERY 4 HOURS AS NEEDED FOR WHEEZING OR SHORTNESS OF BREATH 18 g 0   atorvastatin (LIPITOR) 10 MG tablet Take 1 tablet (10 mg total) by mouth at bedtime. 90 tablet 3   clotrimazole-betamethasone (LOTRISONE) cream Apply 1 application topically 2 (two) times daily. 30 g 3   escitalopram (LEXAPRO) 10 MG tablet For mood (Patient taking differently: Take 10 mg by mouth. For mood) 90 tablet 2   hydrochlorothiazide (HYDRODIURIL) 25 MG tablet TAKE 1 TABLET BY MOUTH IN THE MORNING FOR BLOOD PRESSURE 90 tablet 2   mirabegron ER (MYRBETRIQ) 25 MG TB24 tablet TAKE 1 TABLET BY MOUTH AT BEDTIME FOR YOUR BLADDER 90 tablet 1   No current facility-administered  medications for this visit.    Allergies as of 09/03/2021   (No Known Allergies)    Family History  Problem Relation Age of Onset   Hypertension Mother    Depression Mother    Hypertension Sister    Hypertension Sister    Colon cancer Neg Hx     Social History   Socioeconomic History   Marital status: Legally Separated    Spouse name: Not on file   Number of children: Not on file   Years of education: Not on file   Highest education level: Not on file  Occupational History   Not on file  Tobacco Use   Smoking status: Never   Smokeless tobacco: Never  Vaping Use   Vaping Use: Never used  Substance and Sexual Activity   Alcohol use: No    Alcohol/week: 0.0 standard drinks   Drug use: No   Sexual activity: Not Currently  Other Topics Concern   Not on file  Social History Narrative   Not on file   Social Determinants of Health   Financial Resource Strain: Not on file  Food Insecurity: Not on file  Transportation Needs: Not on file  Physical Activity: Not on file  Stress: Not on file  Social Connections: Not on file  Intimate Partner Violence: Not on file    Review of Systems: Gen: Denies any fever,  chills, cold or flulike symptoms, presyncope, syncope. CV: Denies chest pain, heart palpitations. Resp: Denies shortness of breath or cough. GI: See HPI GU : Denies urinary burning, urinary frequency, urinary hesitancy MS: Denies joint pain. Derm: Denies rash. Psych: Depression well controlled with medications. Heme: See HPI  Physical Exam: BP (!) 163/82   Pulse 67   Temp 97.8 F (36.6 C) (Temporal)   Ht 5\' 2"  (1.575 m)   Wt 257 lb 6.4 oz (116.8 kg)   LMP 12/20/2011   BMI 47.08 kg/m  General:   Alert and oriented. Pleasant and cooperative. Well-nourished and well-developed.  Head:  Normocephalic and atraumatic. Eyes:  Without icterus, sclera clear and conjunctiva pink.  Ears:  Normal auditory acuity. Lungs:  Clear to auscultation bilaterally. No  wheezes, rales, or rhonchi. No distress.  Heart:  S1, S2 present without murmurs appreciated.  Abdomen:  +BS, soft, non-tender and non-distended. No HSM noted. No guarding or rebound. No masses appreciated.  Rectal:  Deferred  Msk:  Symmetrical without gross deformities. Normal posture. Extremities:  Without edema. Neurologic:  Alert and  oriented x4;  grossly normal neurologically. Skin:  Intact without significant lesions or rashes. Psych: Normal mood and affect.    Assessment: 65 year old female with history of depression, HTN, HLD, obesity, presenting today to discuss scheduling screening colonoscopy.  Reports her last colonoscopy was more than 10 years ago at [place] she may have had a polyp, but is not sure.  Denies family history of colon cancer.  She is currently without any significant upper or lower GI symptoms.  No alarm symptoms.   Plan: Proceed with colonoscopy with propofol with Dr. Gala Romney in the near future. The risks, benefits, and alternatives have been discussed with the patient in detail. The patient states understanding and desires to proceed. ASA 3 Request colonoscopy records from [facility]. Follow-up as needed   Nevin Bloodgood [facility] 09/03/2021

## 2021-09-03 ENCOUNTER — Encounter: Payer: Self-pay | Admitting: Gastroenterology

## 2021-09-03 ENCOUNTER — Encounter: Payer: Self-pay | Admitting: *Deleted

## 2021-09-03 ENCOUNTER — Other Ambulatory Visit: Payer: Self-pay

## 2021-09-03 ENCOUNTER — Ambulatory Visit (INDEPENDENT_AMBULATORY_CARE_PROVIDER_SITE_OTHER): Payer: Medicare Other | Admitting: Gastroenterology

## 2021-09-03 VITALS — BP 163/82 | HR 67 | Temp 97.8°F | Ht 62.0 in | Wt 257.4 lb

## 2021-09-03 DIAGNOSIS — Z01818 Encounter for other preprocedural examination: Secondary | ICD-10-CM | POA: Diagnosis not present

## 2021-09-03 DIAGNOSIS — Z1211 Encounter for screening for malignant neoplasm of colon: Secondary | ICD-10-CM | POA: Diagnosis not present

## 2021-09-03 NOTE — Progress Notes (Signed)
LMOVM with pre-op appt details

## 2021-09-03 NOTE — Patient Instructions (Signed)
We will arrange for you to have a colonoscopy in the near future with Dr. Gala Romney.  We will follow-up with you in the office as needed.  Do not hesitate to call if you have any new GI concerns.   It was a pleasure meeting you today!  I hope he had a Happy Thanksgiving!  Aliene Altes, PA-C [facility]

## 2021-09-24 ENCOUNTER — Telehealth: Payer: Self-pay | Admitting: Internal Medicine

## 2021-09-24 NOTE — Telephone Encounter (Signed)
Noted  

## 2021-09-24 NOTE — Telephone Encounter (Signed)
PLEASE CALL PATIENT, NEEDS TO RESCHEDULE PROCEDURE, DEATH IN THE FAMILY

## 2021-09-24 NOTE — Telephone Encounter (Signed)
Called pt, her sister passed away in a car accident. Informed her we will call her to reschedule TCS when Dr. Roseanne Kaufman next schedule is available. Endo scheduler informed to cancel procedure for 09/27/21.

## 2021-09-25 ENCOUNTER — Encounter (HOSPITAL_COMMUNITY): Payer: Medicare Other

## 2021-09-27 ENCOUNTER — Ambulatory Visit (HOSPITAL_COMMUNITY): Admit: 2021-09-27 | Payer: Medicare Other | Admitting: Internal Medicine

## 2021-09-27 ENCOUNTER — Encounter (HOSPITAL_COMMUNITY): Payer: Self-pay

## 2021-09-27 SURGERY — COLONOSCOPY WITH PROPOFOL
Anesthesia: Monitor Anesthesia Care

## 2021-10-04 NOTE — Telephone Encounter (Signed)
Tried to call pt to reschedule TCS w/Propofol ASA 3 w/Dr. Gala Romney, LMOVM for return call.

## 2021-10-04 NOTE — Telephone Encounter (Signed)
Pre-op appt 11/01/21. Appt letter mailed with procedure instructions.

## 2021-10-04 NOTE — Telephone Encounter (Signed)
Spoke to pt, TCS rescheduled to 11/01/21 at 12:30pm. She already has Clenpiq sample. Orders entered.

## 2021-10-23 ENCOUNTER — Telehealth: Payer: Self-pay | Admitting: Nurse Practitioner

## 2021-10-23 NOTE — Telephone Encounter (Signed)
Patient returned missed call about scheduling appt; unsure if it was for AWV. Patient will call back another time to schedule; handling family matters at this time.

## 2021-10-26 NOTE — Patient Instructions (Signed)
Sheryl [name]  10/26/2021     @PREFPERIOPPHARMACY @   Your procedure is scheduled on  11/01/2021.   Report to [facility] at  [place]  A.M.   Call this number if you have problems the morning of surgery:  [phone]   Remember:  Follow the diet and prep instructions given to you by the office.    Use your inhaler before you come and bring your rescue inhaler with you.    Take these medicines the morning of surgery with A SIP OF WATER                                              lexapro.     Do not wear jewelry, make-up or nail polish.  Do not wear lotions, powders, or perfumes, or deodorant.  Do not shave 48 hours prior to surgery.  Men may shave face and neck.  Do not bring valuables to the hospital.  [facility] is not responsible for any belongings or valuables.  Contacts, dentures or bridgework may not be worn into surgery.  Leave your suitcase in the car.  After surgery it may be brought to your room.  For patients admitted to the hospital, discharge time will be determined by your treatment team.  Patients discharged the day of surgery will not be allowed to drive home and must have someone with them for 24 hours.    Special instructions:   DO NOT smoke tobacco or vape for 24 hours before your procedure.  Please read over the following fact sheets that you were given. Anesthesia Post-op Instructions and Care and Recovery After Surgery      Colonoscopy, Adult, Care After This sheet gives you information about how to care for yourself after your procedure. Your health care provider may also give you more specific instructions. If you have problems or questions, contact your health care provider. What can I expect after the procedure? After the procedure, it is common to have: A small amount of blood in your stool for 24 hours after the procedure. Some gas. Mild cramping or bloating of your abdomen. Follow these instructions at home: Eating and  drinking  Drink enough fluid to keep your urine pale yellow. Follow instructions from your health care provider about eating or drinking restrictions. Resume your normal diet as instructed by your health care provider. Avoid heavy or fried foods that are hard to digest. Activity Rest as told by your health care provider. Avoid sitting for a long time without moving. Get up to take short walks every 1-2 hours. This is important to improve blood flow and breathing. Ask for help if you feel weak or unsteady. Return to your normal activities as told by your health care provider. Ask your health care provider what activities are safe for you. Managing cramping and bloating  Try walking around when you have cramps or feel bloated. Apply heat to your abdomen as told by your health care provider. Use the heat source that your health care provider recommends, such as a moist heat pack or a heating pad. Place a towel between your skin and the heat source. Leave the heat on for 20-30 minutes. Remove the heat if your skin turns bright red. This is especially important if you are unable to feel pain, heat, or cold. You may have  a greater risk of getting burned. General instructions If you were given a sedative during the procedure, it can affect you for several hours. Do not drive or operate machinery until your health care provider says that it is safe. For the first 24 hours after the procedure: Do not sign important documents. Do not drink alcohol. Do your regular daily activities at a slower pace than normal. Eat soft foods that are easy to digest. Take over-the-counter and prescription medicines only as told by your health care provider. Keep all follow-up visits as told by your health care provider. This is important. Contact a health care provider if: You have blood in your stool 2-3 days after the procedure. Get help right away if you have: More than a small spotting of blood in your  stool. Large blood clots in your stool. Swelling of your abdomen. Nausea or vomiting. A fever. Increasing pain in your abdomen that is not relieved with medicine. Summary After the procedure, it is common to have a small amount of blood in your stool. You may also have mild cramping and bloating of your abdomen. If you were given a sedative during the procedure, it can affect you for several hours. Do not drive or operate machinery until your health care provider says that it is safe. Get help right away if you have a lot of blood in your stool, nausea or vomiting, a fever, or increased pain in your abdomen. This information is not intended to replace advice given to you by your health care provider. Make sure you discuss any questions you have with your health care provider. Document Revised: 08/13/2019 Document Reviewed: 05/03/2019 Elsevier Patient Education  [place] After This sheet gives you information about how to care for yourself after your procedure. Your health care provider may also give you more specific instructions. If you have problems or questions, contact your health care provider. What can I expect after the procedure? After the procedure, it is common to have: Tiredness. Forgetfulness about what happened after the procedure. Impaired judgment for important decisions. Nausea or vomiting. Some difficulty with balance. Follow these instructions at home: For the time period you were told by your health care provider:   Rest as needed. Do not participate in activities where you could fall or become injured. Do not drive or use machinery. Do not drink alcohol. Do not take sleeping pills or medicines that cause drowsiness. Do not make important decisions or sign legal documents. Do not take care of children on your own. Eating and drinking Follow the diet that is recommended by your health care provider. Drink enough fluid to  keep your urine pale yellow. If you vomit: Drink water, juice, or soup when you can drink without vomiting. Make sure you have little or no nausea before eating solid foods. General instructions Have a responsible adult stay with you for the time you are told. It is important to have someone help care for you until you are awake and alert. Take over-the-counter and prescription medicines only as told by your health care provider. If you have sleep apnea, surgery and certain medicines can increase your risk for breathing problems. Follow instructions from your health care provider about wearing your sleep device: Anytime you are sleeping, including during daytime naps. While taking prescription pain medicines, sleeping medicines, or medicines that make you drowsy. Avoid smoking. Keep all follow-up visits as told by your health care provider. This is important. Contact a  health care provider if: You keep feeling nauseous or you keep vomiting. You feel light-headed. You are still sleepy or having trouble with balance after 24 hours. You develop a rash. You have a fever. You have redness or swelling around the IV site. Get help right away if: You have trouble breathing. You have new-onset confusion at home. Summary For several hours after your procedure, you may feel tired. You may also be forgetful and have poor judgment. Have a responsible adult stay with you for the time you are told. It is important to have someone help care for you until you are awake and alert. Rest as told. Do not drive or operate machinery. Do not drink alcohol or take sleeping pills. Get help right away if you have trouble breathing, or if you suddenly become confused. This information is not intended to replace advice given to you by your health care provider. Make sure you discuss any questions you have with your health care provider. Document Revised: 06/22/2020 Document Reviewed: 09/09/2019 Elsevier Patient  Education  2022 [employer].

## 2021-10-30 ENCOUNTER — Telehealth: Payer: Self-pay | Admitting: Internal Medicine

## 2021-10-30 ENCOUNTER — Encounter (HOSPITAL_COMMUNITY)
Admission: RE | Admit: 2021-10-30 | Discharge: 2021-10-30 | Disposition: A | Discharge status: Home / Self Care | Payer: Medicare Other | Source: Ambulatory Visit | Attending: Internal Medicine | Admitting: Internal Medicine

## 2021-10-30 ENCOUNTER — Encounter (HOSPITAL_COMMUNITY): Payer: Self-pay

## 2021-10-30 DIAGNOSIS — R7303 Prediabetes: Secondary | ICD-10-CM

## 2021-10-30 DIAGNOSIS — Z79899 Other long term (current) drug therapy: Secondary | ICD-10-CM

## 2021-10-30 NOTE — Telephone Encounter (Signed)
Called pt. She is on for Thursday for procedure and needs to r/s. Aware we will call once we get further schedule.

## 2021-10-30 NOTE — Telephone Encounter (Signed)
Patient needs to reschedule her procedure 

## 2021-11-01 ENCOUNTER — Ambulatory Visit (HOSPITAL_COMMUNITY): Admission: RE | Admit: 2021-11-01 | Payer: Medicare Other | Source: Home / Self Care | Admitting: Internal Medicine

## 2021-11-01 ENCOUNTER — Encounter (HOSPITAL_COMMUNITY): Admission: RE | Payer: Self-pay | Source: Home / Self Care

## 2021-11-01 SURGERY — COLONOSCOPY WITH PROPOFOL
Anesthesia: Monitor Anesthesia Care

## 2021-11-01 NOTE — Telephone Encounter (Signed)
Tried to call pt to reschedule TCS w/Propofol ASA 3 w/Dr. Gala Romney, LMOVM for return call.

## 2021-11-02 NOTE — Telephone Encounter (Signed)
Called pt, TCS rescheduled to 11/16/21 at 9:15am. Pt had Clenpiq sample but she had left it in her car and it froze. Pt will come by office to pick up another Clenpiq sample and new instructions.   Pre-op appt 11/13/21 at 11:30am. Appt letter, new instructions, and Clenpiq sample placed at front desk for pt to pick up.

## 2021-11-12 ENCOUNTER — Other Ambulatory Visit: Payer: Self-pay | Admitting: Nurse Practitioner

## 2021-11-12 ENCOUNTER — Telehealth: Payer: Self-pay | Admitting: Nurse Practitioner

## 2021-11-12 ENCOUNTER — Encounter: Payer: Self-pay | Admitting: Nurse Practitioner

## 2021-11-12 ENCOUNTER — Other Ambulatory Visit: Payer: Self-pay

## 2021-11-12 ENCOUNTER — Ambulatory Visit (INDEPENDENT_AMBULATORY_CARE_PROVIDER_SITE_OTHER): Payer: Medicare Other | Admitting: Nurse Practitioner

## 2021-11-12 VITALS — BP 142/82 | HR 94 | Ht 62.0 in | Wt 258.0 lb

## 2021-11-12 DIAGNOSIS — Z124 Encounter for screening for malignant neoplasm of cervix: Secondary | ICD-10-CM

## 2021-11-12 DIAGNOSIS — R059 Cough, unspecified: Secondary | ICD-10-CM

## 2021-11-12 DIAGNOSIS — R7303 Prediabetes: Secondary | ICD-10-CM | POA: Diagnosis not present

## 2021-11-12 DIAGNOSIS — I10 Essential (primary) hypertension: Secondary | ICD-10-CM

## 2021-11-12 DIAGNOSIS — N289 Disorder of kidney and ureter, unspecified: Secondary | ICD-10-CM

## 2021-11-12 DIAGNOSIS — E785 Hyperlipidemia, unspecified: Secondary | ICD-10-CM | POA: Diagnosis not present

## 2021-11-12 DIAGNOSIS — Z0000 Encounter for general adult medical examination without abnormal findings: Secondary | ICD-10-CM | POA: Diagnosis not present

## 2021-11-12 DIAGNOSIS — K0889 Other specified disorders of teeth and supporting structures: Secondary | ICD-10-CM

## 2021-11-12 DIAGNOSIS — Z87898 Personal history of other specified conditions: Secondary | ICD-10-CM

## 2021-11-12 MED ORDER — ALBUTEROL SULFATE HFA 108 (90 BASE) MCG/ACT IN AERS: INHALATION_SPRAY | RESPIRATORY_TRACT | 0 refills | Status: DC

## 2021-11-12 NOTE — Progress Notes (Signed)
Patient: Sheryl [name], Female    DOB: 02/12/1956, 66 y.o.   MRN: [id number]  Visit Date: 11/15/2021  Today's Provider: Eulogio Bear, NP   Chief Complaint  Patient presents with   Annual Exam    Subjective:   Sheryl [name] is a 66 y.o. female who presents today for her Subsequent Annual Wellness Visit.  Care Team:   Gastroenterology - Dr. Gala Romney Primary care provider - Noemi Chapel, NP  HPI  Patient reports ongoing dental infection.  She has been looking for an affordable dentist to have some work on her teeth.  She reports many of them are chipped and "need to be removed."  She denies fevers, pus draining from her teeth, swelling in her face, or pain in her face.   Hyperlipidemia - she has been taking atorvastatin about 4 nights per week.  She reports she forgets to take it.    Taking Lexapro 10 mg daily for mood.  Reports this is working well for her.    Hypertension - she has been taking HCTZ 25 mg daily.  She does not check hre blood pressure at home.  She denies vision changes, chest pain, shortness of breath, dizziness/lightheadedness, and lower extremity swelling.    Asking for rescue refill of albuterol inhaler to have on hand.  She has not needed to use recently, however wants to have on hand.   Review of Systems  Constitutional: Negative.  Negative for activity change, appetite change, chills, fatigue and fever.  HENT:  Positive for dental problem. Negative for congestion, facial swelling, nosebleeds, postnasal drip, rhinorrhea, sinus pressure, sinus pain, sneezing and sore throat.   Respiratory: Negative.    Cardiovascular: Negative.   Gastrointestinal: Negative.   Musculoskeletal: Negative.   Skin: Negative.   Neurological: Negative.  Negative for dizziness, syncope, light-headedness and headaches.  Psychiatric/Behavioral:  Negative for decreased concentration, dysphoric mood and suicidal ideas. The patient is not nervous/anxious and is not  hyperactive.    Past Medical History:  Diagnosis Date   Chronic back pain 10/22/2011   Chronic neck pain 10/22/2011   Depression    HLD (hyperlipidemia)    Hypertension    Past Surgical History:  Procedure Laterality Date   BREAST CYST EXCISION     right   COLONOSCOPY     per patient, many years ago at [facility] History  Problem Relation Age of Onset   Hypertension Mother    Depression Mother    Hypertension Sister    Hypertension Sister    Colon cancer Neg Hx     Social History   Socioeconomic History   Marital status: Legally Separated    Spouse name: Not on file   Number of children: Not on file   Years of education: Not on file   Highest education level: Not on file  Occupational History   Not on file  Tobacco Use   Smoking status: Never   Smokeless tobacco: Never  Vaping Use   Vaping Use: Never used  Substance and Sexual Activity   Alcohol use: No    Alcohol/week: 0.0 standard drinks   Drug use: No   Sexual activity: Not Currently  Other Topics Concern   Not on file  Social History Narrative   Not on file   Social Determinants of Health   Financial Resource Strain: Not on file  Food Insecurity: Not on file  Transportation Needs: Not on file  Physical  Activity: Not on file  Stress: Not on file  Social Connections: Not on file  Intimate Partner Violence: Not on file    Outpatient Encounter Medications as of 11/12/2021  Medication Sig   Aspirin-Salicylamide-Caffeine (BC HEADACHE POWDER PO) Take 2 packets by mouth daily as needed (headaches).   atorvastatin (LIPITOR) 10 MG tablet Take 1 tablet (10 mg total) by mouth at bedtime.   clotrimazole-betamethasone (LOTRISONE) cream Apply 1 application topically 2 (two) times daily. (Patient taking differently: Apply 1 application topically 2 (two) times daily as needed (skin irritation/rash).)   escitalopram (LEXAPRO) 10 MG tablet For mood   hydrochlorothiazide (HYDRODIURIL)  25 MG tablet TAKE 1 TABLET BY MOUTH IN THE MORNING FOR BLOOD PRESSURE   mirabegron ER (MYRBETRIQ) 25 MG TB24 tablet TAKE 1 TABLET BY MOUTH AT BEDTIME FOR YOUR BLADDER   [DISCONTINUED] albuterol (VENTOLIN HFA) 108 (90 Base) MCG/ACT inhaler INHALE 2 PUFFS INTO THE LUNGS EVERY 4 HOURS AS NEEDED FOR WHEEZING OR SHORTNESS OF BREATH   albuterol (VENTOLIN HFA) 108 (90 Base) MCG/ACT inhaler INHALE 2 PUFFS INTO THE LUNGS EVERY 4 HOURS AS NEEDED FOR WHEEZING OR SHORTNESS OF BREATH   amoxicillin-clavulanate (AUGMENTIN) 875-125 MG tablet Take 1 tablet by mouth 2 (two) times daily for 7 days.   No facility-administered encounter medications on file as of 11/12/2021.   Lives with daughter.    Functional Status Survey: Is the patient deaf or have difficulty hearing?: No Does the patient have difficulty seeing, even when wearing glasses/contacts?: No Does the patient have difficulty concentrating, remembering, or making decisions?: No Does the patient have difficulty walking or climbing stairs?: Yes Does the patient have difficulty dressing or bathing?: No Does the patient have difficulty doing errands alone such as visiting a doctor's office or shopping?: Yes   Fall Risk Assessment Fall Risk 01/27/2017 08/18/2019 09/06/2020 12/13/2020 11/12/2021  Falls in the past year? No 0 0 0 0  Was there an injury with Fall? - - - 0 0  Fall Risk Category Calculator - - - 0 0  Fall Risk Category - - - Low Low  Patient Fall Risk Level - Low fall risk Low fall risk - Low fall risk  Fall risk Follow up - Falls evaluation completed Education provided - -     Depression Screen Depression screen [facility] 2/9 11/12/2021 08/10/2021 09/06/2020 08/18/2019 08/18/2019  Decreased Interest 0 1 0 0 0  Down, Depressed, Hopeless 0 0 0 0 0  PHQ - 2 Score 0 1 0 0 0  Altered sleeping - 0 - 2 -  Tired, decreased energy - 1 - 2 -  Change in appetite - 0 - 0 -  Feeling bad or failure about yourself  - 0 - 0 -  Trouble concentrating - 0 - 0  -  Moving slowly or fidgety/restless - 0 - 0 -  Suicidal thoughts - 0 - 0 -  PHQ-9 Score - 2 - 4 -  Difficult doing work/chores - Not difficult at all - Not difficult at all -    Advanced Directives Does patient have a HCPOA?  No - would like to have her daughter If yes, name and contact information: Laure Kidney, daughter  Does patient have a living will or MOST form?  no  Objective:   Vitals: BP (!) 142/82    Pulse 94    Ht 5\' 2"  (1.575 m)    Wt 258 lb (117 kg)    LMP 12/20/2011    SpO2 98%  BMI 47.19 kg/m  Body mass index is 47.19 kg/m. No results found.  Physical Exam Vitals and nursing note reviewed. Exam conducted with a chaperone present Elizabeth Palau, CMA).  Constitutional:      General: She is not in acute distress.    Appearance: She is obese. She is not toxic-appearing.  HENT:     Head: Normocephalic and atraumatic.     Nose: Nose normal. No congestion.     Mouth/Throat:     Mouth: Mucous membranes are moist.     Dentition: Abnormal dentition. Gingival swelling and dental caries present.     Pharynx: Oropharynx is clear.  Neck:     Vascular: No carotid bruit.  Cardiovascular:     Rate and Rhythm: Normal rate and regular rhythm.     Heart sounds: Normal heart sounds. No murmur heard. Pulmonary:     Effort: Pulmonary effort is normal. No respiratory distress.     Breath sounds: Normal breath sounds. No wheezing, rhonchi or rales.  Genitourinary:    General: Normal vulva.     Exam position: Lithotomy position.     Vagina: Normal. No erythema, tenderness or bleeding.     Cervix: Normal.     Uterus: Normal.      Adnexa: Right adnexa normal and left adnexa normal.       Right: No tenderness or fullness.         Left: No tenderness or fullness.    Musculoskeletal:     Cervical back: Normal range of motion.     Right lower leg: No edema.     Left lower leg: No edema.  Lymphadenopathy:     Cervical: No cervical adenopathy.     Lower Body: No right  inguinal adenopathy. No left inguinal adenopathy.  Skin:    General: Skin is warm and dry.     Coloration: Skin is not jaundiced or pale.     Findings: No erythema.    Assessment & Plan:   1. Encounter for annual wellness exam in Medicare patient   2. Essential hypertension, benign Chronic.  Check kidney function today.  Plan to continue HCTZ 25 mg daily as long as kidney function and electrolytes stable.  Encouraged monitoring BP at home - goal is less than 140/90 and recheck is borderline today.  - BASIC METABOLIC PANEL WITH GFR  3. Borderline diabetes Check hgbA1c today.  Encouraged avoidance of simple sugars and encouraged eating complex carbohydrates.  - BASIC METABOLIC PANEL WITH GFR - Hemoglobin A1c  4. Hyperlipidemia, unspecified hyperlipidemia type Chronic.  She is taking atorvastatin a few nights per week.  Last LDL nonfasting and she is not fasting today.  Will defer checking lipid levels today; I encouraged patient to take atorvastatin regularly.    5. Pain, dental Encouraged patient to set appointment with dentist; she declines referral today.  Start Augmentin twice daily for 7 days.  Seek urgent care if symptoms persist despite antibiotics or if they worsen.  - amoxicillin-clavulanate (AUGMENTIN) 875-125 MG tablet; Take 1 tablet by mouth 2 (two) times daily for 7 days.  Dispense: 14 tablet; Refill: 0  6. History of wheezing She is not wheezing today and denies any wheezing recently.  I think it is reasonable to have an inhaler on had for rescue purposes.  She should make an office visit if she starts using it regularly and this was explained to the patient.  - albuterol (VENTOLIN HFA) 108 (90 Base) MCG/ACT inhaler; INHALE 2 PUFFS INTO THE  LUNGS EVERY 4 HOURS AS NEEDED FOR WHEEZING OR SHORTNESS OF BREATH  Dispense: 18 g; Refill: 0  7. Screening for cervical cancer  - PAP,TP IMGw/HPV RNA,rflx [id number],61/60   Reviewed patient's Family Medical History  Reviewed  and updated list of patient's medical providers  Assessment of cognitive impairment was done  Assessed patient's functional ability  Established a written schedule for health screening [place] Completed and Reviewed  Advanced Directions - packet given for patient to review and fill out with daughter as HCPOA  Exercise Activities and Dietary recommendations   Immunization History  Administered Date(s) Administered   Influenza,inj,Quad PF,6+ Mos 11/19/2016, 08/18/2019, 09/06/2020, 08/10/2021   Moderna Sars-Covid-2 Vaccination 12/29/2020, 01/26/2021, 07/31/2021   Tdap 05/13/2016, 11/13/2016    Health Maintenance  Topic Date Due   Zoster Vaccines- Shingrix (1 of 2) Never done   COLONOSCOPY (Pts 45-25yrs Insurance coverage will need to be confirmed)  Never done   COVID-19 Vaccine (4 - Booster for Moderna series) 09/25/2021   MAMMOGRAM  10/10/2022   PAP SMEAR-Modifier  11/12/2024   TETANUS/TDAP  11/13/2026   INFLUENZA VACCINE  Completed   Hepatitis C Screening  Completed   HIV Screening  Completed   HPV VACCINES  Aged Out    Discussed health benefits of physical activity, and encouraged her to engage in regular exercise appropriate for her age and condition.   Meds ordered this encounter  Medications   albuterol (VENTOLIN HFA) 108 (90 Base) MCG/ACT inhaler    Sig: INHALE 2 PUFFS INTO THE LUNGS EVERY 4 HOURS AS NEEDED FOR WHEEZING OR SHORTNESS OF BREATH    Dispense:  18 g    Refill:  0    Will need appt for further refills   amoxicillin-clavulanate (AUGMENTIN) 875-125 MG tablet    Sig: Take 1 tablet by mouth 2 (two) times daily for 7 days.    Dispense:  14 tablet    Refill:  0    Order Specific Question:   Supervising Provider    Answer:   Jenna Luo T [[id number]]    Current Outpatient Medications:    Aspirin-Salicylamide-Caffeine (BC HEADACHE POWDER PO), Take 2 packets by mouth daily as needed (headaches)., Disp: , Rfl:    atorvastatin  (LIPITOR) 10 MG tablet, Take 1 tablet (10 mg total) by mouth at bedtime., Disp: 90 tablet, Rfl: 3   clotrimazole-betamethasone (LOTRISONE) cream, Apply 1 application topically 2 (two) times daily. (Patient taking differently: Apply 1 application topically 2 (two) times daily as needed (skin irritation/rash).), Disp: 30 g, Rfl: 3   escitalopram (LEXAPRO) 10 MG tablet, For mood, Disp: 90 tablet, Rfl: 2   hydrochlorothiazide (HYDRODIURIL) 25 MG tablet, TAKE 1 TABLET BY MOUTH IN THE MORNING FOR BLOOD PRESSURE, Disp: 90 tablet, Rfl: 2   mirabegron ER (MYRBETRIQ) 25 MG TB24 tablet, TAKE 1 TABLET BY MOUTH AT BEDTIME FOR YOUR BLADDER, Disp: 90 tablet, Rfl: 1   albuterol (VENTOLIN HFA) 108 (90 Base) MCG/ACT inhaler, INHALE 2 PUFFS INTO THE LUNGS EVERY 4 HOURS AS NEEDED FOR WHEEZING OR SHORTNESS OF BREATH, Disp: 18 g, Rfl: 0   amoxicillin-clavulanate (AUGMENTIN) 875-125 MG tablet, Take 1 tablet by mouth 2 (two) times daily for 7 days., Disp: 14 tablet, Rfl: 0 Medications Discontinued During This Encounter  Medication Reason   albuterol (VENTOLIN HFA) 108 (90 Base) MCG/ACT inhaler Reorder    Next Medicare Wellness Visit in 12+ months

## 2021-11-12 NOTE — Patient Instructions (Signed)
Sheryl [name]  11/12/2021     @PREFPERIOPPHARMACY @   Your procedure is scheduled on  11/16/2021.   Report to [facility] at  [facility]   Call this number if you have problems the morning of surgery:  [phone]   Remember:  Follow the diet and prep instructions given to you by the office.    Use your inhaler before you come and bring your rescue inhaler with you.    Take these medicines the morning of surgery with A SIP OF WATER                                                lexapro.     Do not wear jewelry, make-up or nail polish.  Do not wear lotions, powders, or perfumes, or deodorant.  Do not shave 48 hours prior to surgery.  Men may shave face and neck.  Do not bring valuables to the hospital.  [facility] is not responsible for any belongings or valuables.  Contacts, dentures or bridgework may not be worn into surgery.  Leave your suitcase in the car.  After surgery it may be brought to your room.  For patients admitted to the hospital, discharge time will be determined by your treatment team.  Patients discharged the day of surgery will not be allowed to drive home and must have someone with them for 24 hours.    Special instructions:   DO NOT smoke tobacco or vape for 24 hours.  Please read over the following fact sheets that you were given. Anesthesia Post-op Instructions and Care and Recovery After Surgery      Colonoscopy, Adult, Care After This sheet gives you information about how to care for yourself after your procedure. Your health care provider may also give you more specific instructions. If you have problems or questions, contact your health care provider. What can I expect after the procedure? After the procedure, it is common to have: A small amount of blood in your stool for 24 hours after the procedure. Some gas. Mild cramping or bloating of your abdomen. Follow these instructions at home: Eating and drinking  Drink enough  fluid to keep your urine pale yellow. Follow instructions from your health care provider about eating or drinking restrictions. Resume your normal diet as instructed by your health care provider. Avoid heavy or fried foods that are hard to digest. Activity Rest as told by your health care provider. Avoid sitting for a long time without moving. Get up to take short walks every 1-2 hours. This is important to improve blood flow and breathing. Ask for help if you feel weak or unsteady. Return to your normal activities as told by your health care provider. Ask your health care provider what activities are safe for you. Managing cramping and bloating  Try walking around when you have cramps or feel bloated. Apply heat to your abdomen as told by your health care provider. Use the heat source that your health care provider recommends, such as a moist heat pack or a heating pad. Place a towel between your skin and the heat source. Leave the heat on for 20-30 minutes. Remove the heat if your skin turns bright red. This is especially important if you are unable to feel pain, heat, or cold. You may have a  greater risk of getting burned. General instructions If you were given a sedative during the procedure, it can affect you for several hours. Do not drive or operate machinery until your health care provider says that it is safe. For the first 24 hours after the procedure: Do not sign important documents. Do not drink alcohol. Do your regular daily activities at a slower pace than normal. Eat soft foods that are easy to digest. Take over-the-counter and prescription medicines only as told by your health care provider. Keep all follow-up visits as told by your health care provider. This is important. Contact a health care provider if: You have blood in your stool 2-3 days after the procedure. Get help right away if you have: More than a small spotting of blood in your stool. Large blood clots in your  stool. Swelling of your abdomen. Nausea or vomiting. A fever. Increasing pain in your abdomen that is not relieved with medicine. Summary After the procedure, it is common to have a small amount of blood in your stool. You may also have mild cramping and bloating of your abdomen. If you were given a sedative during the procedure, it can affect you for several hours. Do not drive or operate machinery until your health care provider says that it is safe. Get help right away if you have a lot of blood in your stool, nausea or vomiting, a fever, or increased pain in your abdomen. This information is not intended to replace advice given to you by your health care provider. Make sure you discuss any questions you have with your health care provider. Document Revised: 08/13/2019 Document Reviewed: 05/03/2019 Elsevier Patient Education  [place] After This sheet gives you information about how to care for yourself after your procedure. Your health care provider may also give you more specific instructions. If you have problems or questions, contact your health care provider. What can I expect after the procedure? After the procedure, it is common to have: Tiredness. Forgetfulness about what happened after the procedure. Impaired judgment for important decisions. Nausea or vomiting. Some difficulty with balance. Follow these instructions at home: For the time period you were told by your health care provider:   Rest as needed. Do not participate in activities where you could fall or become injured. Do not drive or use machinery. Do not drink alcohol. Do not take sleeping pills or medicines that cause drowsiness. Do not make important decisions or sign legal documents. Do not take care of children on your own. Eating and drinking Follow the diet that is recommended by your health care provider. Drink enough fluid to keep your urine pale yellow. If  you vomit: Drink water, juice, or soup when you can drink without vomiting. Make sure you have little or no nausea before eating solid foods. General instructions Have a responsible adult stay with you for the time you are told. It is important to have someone help care for you until you are awake and alert. Take over-the-counter and prescription medicines only as told by your health care provider. If you have sleep apnea, surgery and certain medicines can increase your risk for breathing problems. Follow instructions from your health care provider about wearing your sleep device: Anytime you are sleeping, including during daytime naps. While taking prescription pain medicines, sleeping medicines, or medicines that make you drowsy. Avoid smoking. Keep all follow-up visits as told by your health care provider. This is important. Contact a health  care provider if: You keep feeling nauseous or you keep vomiting. You feel light-headed. You are still sleepy or having trouble with balance after 24 hours. You develop a rash. You have a fever. You have redness or swelling around the IV site. Get help right away if: You have trouble breathing. You have new-onset confusion at home. Summary For several hours after your procedure, you may feel tired. You may also be forgetful and have poor judgment. Have a responsible adult stay with you for the time you are told. It is important to have someone help care for you until you are awake and alert. Rest as told. Do not drive or operate machinery. Do not drink alcohol or take sleeping pills. Get help right away if you have trouble breathing, or if you suddenly become confused. This information is not intended to replace advice given to you by your health care provider. Make sure you discuss any questions you have with your health care provider. Document Revised: 06/22/2020 Document Reviewed: 09/09/2019 Elsevier Patient Education  2022 [employer].

## 2021-11-12 NOTE — Telephone Encounter (Signed)
Patient has outstanding balance totaling $368.02 from several dates of service dating back to 2021.  Patient has been covered under Medicare Part A&B since 2003, id #[id number].  Requesting for bills to be resubmitted.  Please advise at [phone].

## 2021-11-13 ENCOUNTER — Other Ambulatory Visit: Payer: Self-pay

## 2021-11-13 ENCOUNTER — Encounter (HOSPITAL_COMMUNITY): Payer: Self-pay

## 2021-11-13 ENCOUNTER — Encounter (HOSPITAL_COMMUNITY)
Admission: RE | Admit: 2021-11-13 | Discharge: 2021-11-13 | Disposition: A | Discharge status: Home / Self Care | Payer: Medicare Other | Source: Ambulatory Visit | Attending: Internal Medicine | Admitting: Internal Medicine

## 2021-11-13 VITALS — BP 151/78 | HR 86 | Temp 97.7°F | Resp 18 | Ht 62.0 in | Wt 258.0 lb

## 2021-11-13 DIAGNOSIS — R7303 Prediabetes: Secondary | ICD-10-CM | POA: Insufficient documentation

## 2021-11-13 DIAGNOSIS — Z01810 Encounter for preprocedural cardiovascular examination: Secondary | ICD-10-CM | POA: Diagnosis present

## 2021-11-13 LAB — BASIC METABOLIC PANEL WITH GFR
BUN/Creatinine Ratio: 10 (calc) (ref 6–22)
BUN: 13 mg/dL (ref 7–25)
CO2: 35 mmol/L — ABNORMAL HIGH (ref 20–32)
Calcium: 9.6 mg/dL (ref 8.6–10.4)
Chloride: 100 mmol/L (ref 98–110)
Creat: 1.35 mg/dL — ABNORMAL HIGH (ref 0.50–1.05)
Glucose, Bld: 105 mg/dL — ABNORMAL HIGH (ref 65–99)
Potassium: 4 mmol/L (ref 3.5–5.3)
Sodium: 140 mmol/L (ref 135–146)
eGFR: 44 mL/min/{1.73_m2} — ABNORMAL LOW (ref >=60)

## 2021-11-13 LAB — HEMOGLOBIN A1C
Hgb A1c MFr Bld: 6.1 % of total Hgb — ABNORMAL HIGH (ref <5.7)
Mean Plasma Glucose: 128 mg/dL
eAG (mmol/L): 7.1 mmol/L

## 2021-11-14 LAB — PAP, TP IMAGING W/ HPV RNA, RFLX HPV TYPE 16,18/45: HPV DNA High Risk: NOT DETECTED

## 2021-11-14 NOTE — Pre-Procedure Instructions (Signed)
Dr Briant Cedar to review EKG.

## 2021-11-14 NOTE — Pre-Procedure Instructions (Signed)
EKG reviewed by Dr Briant Cedar and is okay to proceed with procedure.

## 2021-11-15 DIAGNOSIS — Z87898 Personal history of other specified conditions: Secondary | ICD-10-CM | POA: Insufficient documentation

## 2021-11-15 DIAGNOSIS — K047 Periapical abscess without sinus: Secondary | ICD-10-CM | POA: Insufficient documentation

## 2021-11-15 MED ORDER — AMOXICILLIN-POT CLAVULANATE 875-125 MG PO TABS: 1.0000 | ORAL_TABLET | Freq: Two times a day (BID) | ORAL | 0 refills | Status: AC

## 2021-11-16 ENCOUNTER — Ambulatory Visit (HOSPITAL_COMMUNITY): Payer: Medicare Other | Admitting: Anesthesiology

## 2021-11-16 ENCOUNTER — Encounter (HOSPITAL_COMMUNITY)
Admission: RE | Disposition: A | Discharge status: Home / Self Care | Payer: Self-pay | Source: Ambulatory Visit | Attending: Internal Medicine

## 2021-11-16 ENCOUNTER — Encounter (HOSPITAL_COMMUNITY): Payer: Self-pay | Admitting: Internal Medicine

## 2021-11-16 ENCOUNTER — Ambulatory Visit (HOSPITAL_COMMUNITY)
Admission: RE | Admit: 2021-11-16 | Discharge: 2021-11-16 | Disposition: A | Discharge status: Home / Self Care | Payer: Medicare Other | Source: Ambulatory Visit | Attending: Internal Medicine | Admitting: Internal Medicine

## 2021-11-16 DIAGNOSIS — E119 Type 2 diabetes mellitus without complications: Secondary | ICD-10-CM | POA: Diagnosis not present

## 2021-11-16 DIAGNOSIS — Z6841 Body Mass Index (BMI) 40.0 and over, adult: Secondary | ICD-10-CM | POA: Insufficient documentation

## 2021-11-16 DIAGNOSIS — Z1211 Encounter for screening for malignant neoplasm of colon: Secondary | ICD-10-CM | POA: Diagnosis not present

## 2021-11-16 DIAGNOSIS — D123 Benign neoplasm of transverse colon: Secondary | ICD-10-CM | POA: Diagnosis not present

## 2021-11-16 DIAGNOSIS — K635 Polyp of colon: Secondary | ICD-10-CM | POA: Diagnosis not present

## 2021-11-16 DIAGNOSIS — I10 Essential (primary) hypertension: Secondary | ICD-10-CM | POA: Diagnosis not present

## 2021-11-16 HISTORY — PX: COLONOSCOPY WITH PROPOFOL: SHX5780

## 2021-11-16 HISTORY — PX: POLYPECTOMY: SHX5525

## 2021-11-16 LAB — GLUCOSE, CAPILLARY: Glucose-Capillary: 99 mg/dL (ref 70–99)

## 2021-11-16 SURGERY — COLONOSCOPY WITH PROPOFOL
Anesthesia: General

## 2021-11-16 MED ORDER — PROPOFOL 10 MG/ML IV BOLUS
INTRAVENOUS | Status: DC | PRN
Start: 2021-11-16 — End: 2021-11-16
  Administered 2021-11-16 (×3): 30 mg via INTRAVENOUS
  Administered 2021-11-16: 50 mg via INTRAVENOUS
  Administered 2021-11-16: 30 mg via INTRAVENOUS
  Administered 2021-11-16: 20 mg via INTRAVENOUS
  Administered 2021-11-16: 30 mg via INTRAVENOUS
  Administered 2021-11-16: 20 mg via INTRAVENOUS

## 2021-11-16 MED ORDER — LACTATED RINGERS IV SOLN: INTRAVENOUS | Status: DC | PRN

## 2021-11-16 MED ORDER — STERILE WATER FOR IRRIGATION IR SOLN
Status: DC | PRN
  Administered 2021-11-16: 50 mL

## 2021-11-16 NOTE — Op Note (Signed)
[facility] Patient Name: Sheryl [name] Procedure Date: 11/16/2021 9:10 AM MRN: [id number] Date of Birth: Jun 01, 1956 Attending MD: Norvel Richards , MD CSN: [id number] Age: 66 Admit Type: Outpatient Procedure:                Colonoscopy Indications:              Screening for colorectal malignant neoplasm Providers:                Norvel Richards, MD, Charlsie Quest. Insurance claims handler, Therapist, sports,                            Suzan Garibaldi. Risa Grill, Technician Referring MD:              Medicines:                Propofol per Anesthesia Complications:            No immediate complications. Estimated Blood Loss:     Estimated blood loss was minimal. Estimated blood                            loss was minimal. Procedure:                Pre-Anesthesia Assessment:                           - Prior to the procedure, a History and Physical                            was performed, and patient medications and                            allergies were reviewed. The patient's tolerance of                            previous anesthesia was also reviewed. The risks                            and benefits of the procedure and the sedation                            options and risks were discussed with the patient.                            All questions were answered, and informed consent                            was obtained. Prior Anticoagulants: The patient has                            taken no previous anticoagulant or antiplatelet                            agents. ASA Grade Assessment: III - A patient with  severe systemic disease. After reviewing the risks                            and benefits, the patient was deemed in                            satisfactory condition to undergo the procedure.                           After obtaining informed consent, the colonoscope                            was passed under direct vision. Throughout the                             procedure, the patient's blood pressure, pulse, and                            oxygen saturations were monitored continuously. The                            [id number] scope was introduced through the                            anus and advanced to the the ileocecal valve. The                            colonoscopy was performed without difficulty. The                            patient tolerated the procedure well. The quality                            of the bowel preparation was adequate. Scope In: 9:43:46 AM Scope Out: 9:55:11 AM Scope Withdrawal Time: 0 hours 9 minutes 17 seconds  Total Procedure Duration: 0 hours 11 minutes 25 seconds  Findings:      The perianal and digital rectal examinations were normal.      Scattered medium-mouthed diverticula were found in the sigmoid colon and       descending colon.      Two sessile polyps were found in the splenic flexure. The polyps were 4       to 5 mm in size. These polyps were removed with a cold snare. Resection       and retrieval were complete. Estimated blood loss was minimal.      The exam was otherwise without abnormality on direct and retroflexion       views.      Non-bleeding internal hemorrhoids were found during retroflexion. The       hemorrhoids were mild, small and Grade I (internal hemorrhoids that do       not prolapse). Impression:               - Diverticulosis in the sigmoid colon and in the  descending colon.                           - Two polyps, removed with a cold snare. Resected                            and retrieved.                           - The examination was otherwise normal on direct                            and retroflexion views. Moderate Sedation:      Moderate (conscious) sedation was personally administered by an       anesthesia professional. The following parameters were monitored: oxygen       saturation, heart rate, blood pressure, respiratory  rate, EKG, adequacy       of pulmonary ventilation, and response to care. Recommendation:           - Patient has a contact number available for                            emergencies. The signs and symptoms of potential                            delayed complications were discussed with the                            patient. Return to normal activities tomorrow.                            Written discharge instructions were provided to the                            patient.                           - Advance diet as tolerated.                           - Continue present medications.                           - Repeat colonoscopy date to be determined after                            pending pathology results are reviewed for                            surveillance.                           - Return to GI office (date not yet determined). Procedure Code(s):        --- Professional ---                           [phone], Colonoscopy,  flexible; with removal of                            tumor(s), polyp(s), or other lesion(s) by snare                            technique Diagnosis Code(s):        --- Professional ---                           Z12.11, Encounter for screening for malignant                            neoplasm of colon                           K63.5, Polyp of colon                           K57.30, Diverticulosis of large intestine without                            perforation or abscess without bleeding CPT copyright 2019 American Medical Association. All rights reserved. The codes documented in this report are preliminary and upon coder review may  be revised to meet current compliance requirements. Cristopher Estimable. Malynn Lucy, MD Norvel Richards, MD 11/16/2021 2:38:47 PM This report has been signed electronically. Number of Addenda: 0

## 2021-11-16 NOTE — Anesthesia Postprocedure Evaluation (Signed)
Anesthesia Post Note  Patient: Sheryl [name]  Procedure(s) Performed: COLONOSCOPY WITH PROPOFOL POLYPECTOMY  Patient location during evaluation: Phase II Anesthesia Type: General Level of consciousness: awake Pain management: pain level controlled Vital Signs Assessment: post-procedure vital signs reviewed and stable Respiratory status: spontaneous breathing and respiratory function stable Cardiovascular status: blood pressure returned to baseline and stable Postop Assessment: no headache and no apparent nausea or vomiting Anesthetic complications: no Comments: Late entry   No notable events documented.   Last Vitals:  Vitals:   11/16/21 1002 11/16/21 1004  BP: (!) 104/50 (!) 101/45  Pulse: 72   Resp: 15 16  Temp: 36.5 C 37 C  SpO2: 97% 99%    Last Pain:  Vitals:   11/16/21 1004  TempSrc: Tympanic  PainSc:                  Louann Sjogren

## 2021-11-16 NOTE — Discharge Instructions (Signed)
°  Colonoscopy Discharge Instructions  Read the instructions outlined below and refer to this sheet in the next few weeks. These discharge instructions provide you with general information on caring for yourself after you leave the hospital. Your doctor may also give you specific instructions. While your treatment has been planned according to the most current medical practices available, unavoidable complications occasionally occur. If you have any problems or questions after discharge, call Dr. Gala Romney at [phone]. ACTIVITY You may resume your regular activity, but move at a slower pace for the next 24 hours.  Take frequent rest periods for the next 24 hours.  Walking will help get rid of the air and reduce the bloated feeling in your belly (abdomen).  No driving for 24 hours (because of the medicine (anesthesia) used during the test).   Do not sign any important legal documents or operate any machinery for 24 hours (because of the anesthesia used during the test).  NUTRITION Drink plenty of fluids.  You may resume your normal diet as instructed by your doctor.  Begin with a light meal and progress to your normal diet. Heavy or fried foods are harder to digest and may make you feel sick to your stomach (nauseated).  Avoid alcoholic beverages for 24 hours or as instructed.  MEDICATIONS You may resume your normal medications unless your doctor tells you otherwise.  WHAT YOU CAN EXPECT TODAY Some feelings of bloating in the abdomen.  Passage of more gas than usual.  Spotting of blood in your stool or on the toilet paper.  IF YOU HAD POLYPS REMOVED DURING THE COLONOSCOPY: No aspirin products for 7 days or as instructed.  No alcohol for 7 days or as instructed.  Eat a soft diet for the next 24 hours.  FINDING OUT THE RESULTS OF YOUR TEST Not all test results are available during your visit. If your test results are not back during the visit, make an appointment with your caregiver to find out the  results. Do not assume everything is normal if you have not heard from your caregiver or the medical facility. It is important for you to follow up on all of your test results.  SEEK IMMEDIATE MEDICAL ATTENTION IF: You have more than a spotting of blood in your stool.  Your belly is swollen (abdominal distention).  You are nauseated or vomiting.  You have a temperature over 101.  You have abdominal pain or discomfort that is severe or gets worse throughout the day.    2 polyps removed from your colon today  Colon polyp and diverticulosis information provided  Further recommendations to follow pending review of pathology report  At patient request, I called Crystal at [phone]-reviewed findings and recommendations

## 2021-11-16 NOTE — Addendum Note (Signed)
Addended by: Noemi Chapel A on: 11/16/2021 08:54 AM   Modules accepted: Orders

## 2021-11-16 NOTE — H&P (Signed)
@LOGO @   Primary Care Physician:  Eulogio Bear, NP Primary Gastroenterologist:  Dr. Gala Romney  Pre-Procedure History & Physical: HPI:  Sheryl [name] is a 66 y.o. female is here for a screening colonoscopy.  No bowel symptoms.  No family history of colon cancer.  Prior colonoscopy in the distant past here at this facility.  However, records were not available for review today.  Past Medical History:  Diagnosis Date   Chronic back pain 10/22/2011   Chronic neck pain 10/22/2011   Depression    HLD (hyperlipidemia)    Hypertension     Past Surgical History:  Procedure Laterality Date   BREAST CYST EXCISION     right   COLONOSCOPY     per patient, many years ago at [place]      Prior to Admission medications   Medication Sig Start Date End Date Taking? Authorizing Provider  Aspirin-Salicylamide-Caffeine (BC HEADACHE POWDER PO) Take 2 packets by mouth daily as needed (headaches).   Yes [provider]  atorvastatin (LIPITOR) 10 MG tablet Take 1 tablet (10 mg total) by mouth at bedtime. 04/27/21  Yes Eulogio Bear, NP  clotrimazole-betamethasone (LOTRISONE) cream Apply 1 application topically 2 (two) times daily. Patient taking differently: Apply 1 application topically 2 (two) times daily as needed (skin irritation/rash). 04/27/21  Yes Eulogio Bear, NP  escitalopram (LEXAPRO) 10 MG tablet For mood 04/27/21  Yes Noemi Chapel A, NP  hydrochlorothiazide (HYDRODIURIL) 25 MG tablet TAKE 1 TABLET BY MOUTH IN THE MORNING FOR BLOOD PRESSURE 11/12/21  Yes Eulogio Bear, NP  mirabegron ER (MYRBETRIQ) 25 MG TB24 tablet TAKE 1 TABLET BY MOUTH AT BEDTIME FOR YOUR BLADDER 09/06/20  Yes [place], Modena Nunnery, MD  albuterol (VENTOLIN HFA) 108 (90 Base) MCG/ACT inhaler INHALE 2 PUFFS INTO THE LUNGS EVERY 4 HOURS AS NEEDED FOR WHEEZING OR SHORTNESS OF BREATH 11/12/21   Eulogio Bear, NP  amoxicillin-clavulanate (AUGMENTIN) 875-125 MG tablet Take 1  tablet by mouth 2 (two) times daily for 7 days. 11/15/21 11/22/21  Eulogio Bear, NP    Allergies as of 11/02/2021   (No Known Allergies)    Family History  Problem Relation Age of Onset   Hypertension Mother    Depression Mother    Hypertension Sister    Hypertension Sister    Colon cancer Neg Hx     Social History   Socioeconomic History   Marital status: Legally Separated    Spouse name: Not on file   Number of children: Not on file   Years of education: Not on file   Highest education level: Not on file  Occupational History   Not on file  Tobacco Use   Smoking status: Never   Smokeless tobacco: Never  Vaping Use   Vaping Use: Never used  Substance and Sexual Activity   Alcohol use: No    Alcohol/week: 0.0 standard drinks   Drug use: No   Sexual activity: Not Currently  Other Topics Concern   Not on file  Social History Narrative   Not on file   Social Determinants of Health   Financial Resource Strain: Not on file  Food Insecurity: Not on file  Transportation Needs: Not on file  Physical Activity: Not on file  Stress: Not on file  Social Connections: Not on file  Intimate Partner Violence: Not on file    Review of Systems: See HPI, otherwise negative ROS  Physical Exam: BP (!) 147/77 (  BP Location: Right Arm)    Pulse 71    Temp 98.2 F (36.8 C)    Resp 17    LMP 12/20/2011    SpO2 97%  General:   Alert,  Well-developed, well-nourished, pleasant and cooperative in NAD Lungs:  Clear throughout to auscultation.   No wheezes, crackles, or rhonchi. No acute distress. Heart:  Regular rate and rhythm; no murmurs, clicks, rubs,  or gallops. Abdomen:  Soft, nontender and nondistended. No masses, hepatosplenomegaly or hernias noted. Normal bowel sounds, without guarding, and without rebound.   Impression/Plan: CHARDA JANIS is now here to undergo a screening colonoscopy.  Average risk screening examination.  Risks, benefits, limitations,  imponderables and alternatives regarding colonoscopy have been reviewed with the patient. Questions have been answered. All parties agreeable.     Notice:  This dictation was prepared with Dragon dictation along with smaller phrase technology. Any transcriptional errors that result from this process are unintentional and may not be corrected upon review.

## 2021-11-16 NOTE — Anesthesia Preprocedure Evaluation (Signed)
Anesthesia Evaluation  Patient identified by MRN, date of birth, ID band Patient awake    Reviewed: Allergy & Precautions, H&P , NPO status , Patient's Chart, lab work & pertinent test results, reviewed documented beta blocker date and time   Airway Mallampati: II  TM Distance: >3 FB Neck ROM: full    Dental no notable dental hx.    Pulmonary neg pulmonary ROS,    Pulmonary exam normal breath sounds clear to auscultation       Cardiovascular Exercise Tolerance: Good hypertension, negative cardio ROS   Rhythm:regular Rate:Normal     Neuro/Psych PSYCHIATRIC DISORDERS Depression  Neuromuscular disease    GI/Hepatic negative GI ROS, Neg liver ROS,   Endo/Other  Morbid obesity  Renal/GU negative Renal ROS  negative genitourinary   Musculoskeletal   Abdominal   Peds  Hematology negative hematology ROS (+)   Anesthesia Other Findings   Reproductive/Obstetrics negative OB ROS                             Anesthesia Physical Anesthesia Plan  ASA: 3  Anesthesia Plan: General   Post-op Pain Management:    Induction:   PONV Risk Score and Plan: Propofol infusion  Airway Management Planned:   Additional Equipment:   Intra-op Plan:   Post-operative Plan:   Informed Consent: I have reviewed the patients History and Physical, chart, labs and discussed the procedure including the risks, benefits and alternatives for the proposed anesthesia with the patient or authorized representative who has indicated his/her understanding and acceptance.     Dental Advisory Given  Plan Discussed with: CRNA  Anesthesia Plan Comments:         Anesthesia Quick Evaluation

## 2021-11-16 NOTE — Transfer of Care (Signed)
Immediate Anesthesia Transfer of Care Note  Patient: Sheryl [name]  Procedure(s) Performed: COLONOSCOPY WITH PROPOFOL POLYPECTOMY  Patient Location: PACU  Anesthesia Type:MAC  Level of Consciousness: awake, alert  and oriented  Airway & Oxygen Therapy: Patient Spontanous Breathing  Post-op Assessment: Report given to RN and Post -op Vital signs reviewed and stable  Post vital signs: Reviewed and stable  Last Vitals:  Vitals Value Taken Time  BP 101/45 11/16/21 1004  Temp 37 C 11/16/21 1004  Pulse 72 11/16/21 1002  Resp 16 11/16/21 1004  SpO2 99 % 11/16/21 1004    Last Pain:  Vitals:   11/16/21 1004  TempSrc: Tympanic  PainSc:          Complications: No notable events documented.

## 2021-11-19 ENCOUNTER — Encounter: Payer: Self-pay | Admitting: Internal Medicine

## 2021-11-19 ENCOUNTER — Telehealth: Payer: Self-pay

## 2021-11-19 LAB — SURGICAL PATHOLOGY

## 2021-11-19 NOTE — Telephone Encounter (Signed)
There are two letters for this patient. One says 7 yrs and the other says 3 yrs for repeat colonoscopy. Which one would you like to go with?

## 2021-11-19 NOTE — Telephone Encounter (Signed)
Will do! thanks

## 2021-11-20 ENCOUNTER — Encounter (HOSPITAL_COMMUNITY): Payer: Self-pay | Admitting: Internal Medicine

## 2021-11-22 NOTE — Telephone Encounter (Signed)
Sending to our billing team.  °

## 2021-12-10 ENCOUNTER — Other Ambulatory Visit (HOSPITAL_COMMUNITY): Payer: Self-pay | Admitting: Nurse Practitioner

## 2021-12-10 DIAGNOSIS — Z1231 Encounter for screening mammogram for malignant neoplasm of breast: Secondary | ICD-10-CM

## 2021-12-17 ENCOUNTER — Encounter: Payer: Self-pay | Admitting: Nurse Practitioner

## 2021-12-19 ENCOUNTER — Other Ambulatory Visit: Payer: Self-pay

## 2021-12-19 ENCOUNTER — Ambulatory Visit (HOSPITAL_COMMUNITY)
Admission: RE | Admit: 2021-12-19 | Discharge: 2021-12-19 | Disposition: A | Discharge status: Home / Self Care | Payer: Medicare Other | Source: Ambulatory Visit | Attending: Nurse Practitioner | Admitting: Nurse Practitioner

## 2021-12-19 DIAGNOSIS — Z1231 Encounter for screening mammogram for malignant neoplasm of breast: Secondary | ICD-10-CM | POA: Insufficient documentation

## 2021-12-25 DIAGNOSIS — J301 Allergic rhinitis due to pollen: Secondary | ICD-10-CM | POA: Diagnosis not present

## 2021-12-25 DIAGNOSIS — E782 Mixed hyperlipidemia: Secondary | ICD-10-CM | POA: Diagnosis not present

## 2021-12-25 DIAGNOSIS — J4520 Mild intermittent asthma, uncomplicated: Secondary | ICD-10-CM | POA: Diagnosis not present

## 2022-02-09 ENCOUNTER — Other Ambulatory Visit: Payer: Self-pay | Admitting: Nurse Practitioner

## 2022-02-09 DIAGNOSIS — F332 Major depressive disorder, recurrent severe without psychotic features: Secondary | ICD-10-CM

## 2022-03-15 DIAGNOSIS — E782 Mixed hyperlipidemia: Secondary | ICD-10-CM | POA: Diagnosis not present

## 2022-03-15 DIAGNOSIS — F32A Depression, unspecified: Secondary | ICD-10-CM | POA: Diagnosis not present

## 2022-03-27 ENCOUNTER — Other Ambulatory Visit: Payer: Self-pay | Admitting: Family Medicine

## 2022-03-27 DIAGNOSIS — F332 Major depressive disorder, recurrent severe without psychotic features: Secondary | ICD-10-CM

## 2022-04-12 DIAGNOSIS — R7303 Prediabetes: Secondary | ICD-10-CM | POA: Diagnosis not present

## 2022-04-19 ENCOUNTER — Ambulatory Visit: Payer: Medicare Other | Admitting: Nurse Practitioner

## 2022-05-22 ENCOUNTER — Other Ambulatory Visit: Payer: Self-pay | Admitting: Nurse Practitioner

## 2022-05-28 ENCOUNTER — Other Ambulatory Visit: Payer: Self-pay | Admitting: Family Medicine

## 2022-05-28 ENCOUNTER — Other Ambulatory Visit: Payer: Self-pay | Admitting: Nurse Practitioner

## 2022-05-28 DIAGNOSIS — F332 Major depressive disorder, recurrent severe without psychotic features: Secondary | ICD-10-CM

## 2022-05-28 DIAGNOSIS — R7303 Prediabetes: Secondary | ICD-10-CM

## 2022-05-29 ENCOUNTER — Other Ambulatory Visit: Payer: Self-pay | Admitting: Nurse Practitioner

## 2022-05-29 DIAGNOSIS — R7303 Prediabetes: Secondary | ICD-10-CM

## 2022-05-29 NOTE — Telephone Encounter (Signed)
former pt of Noemi Chapel NP  Requested Prescriptions  Refused Prescriptions Disp Refills  . atorvastatin (LIPITOR) 10 MG tablet [Pharmacy Med Name: ATORVASTATIN '10MG'$  TABLETS] 90 tablet 3    Sig: TAKE 1 TABLET(10 MG) BY MOUTH AT BEDTIME     Cardiovascular:  Antilipid - Statins Failed - 05/29/2022  8:26 AM      Failed - Lipid Panel in normal range within the last 12 months    Cholesterol  Date Value Ref Range Status  08/10/2021 197 <200 mg/dL Final   LDL Cholesterol (Calc)  Date Value Ref Range Status  08/10/2021 129 (H) mg/dL (calc) Final    Comment:    Reference range: <100 . Desirable range <100 mg/dL for primary prevention;   <70 mg/dL for patients with CHD or diabetic patients  with > or = 2 CHD risk factors. Marland Kitchen LDL-C is now calculated using the Martin-Hopkins  calculation, which is a validated novel method providing  better accuracy than the Friedewald equation in the  estimation of LDL-C.  Cresenciano Genre et al. Annamaria Helling. 8032;122(48): 2061-2068  (http://education.[facility].com/faq/FAQ164)    HDL  Date Value Ref Range Status  08/10/2021 50 > OR = 50 mg/dL Final   Triglycerides  Date Value Ref Range Status  08/10/2021 83 <150 mg/dL Final         Passed - Patient is not pregnant      Passed - Valid encounter within last 12 months    Recent Outpatient Visits          6 months ago Encounter for annual wellness exam in Medicare patient   [place] Noemi Chapel A, NP   9 months ago Severe episode of recurrent major depressive disorder, without psychotic features ([place])   [place] Eulogio Bear, NP   1 year ago Essential hypertension, benign   [place] Eulogio Bear, NP   1 year ago Breast mass, right   [place] Eulogio Bear, NP   1 year ago History of [place], Modena Nunnery, MD

## 2022-05-31 DIAGNOSIS — E1169 Type 2 diabetes mellitus with other specified complication: Secondary | ICD-10-CM | POA: Diagnosis not present

## 2022-05-31 DIAGNOSIS — I10 Essential (primary) hypertension: Secondary | ICD-10-CM | POA: Diagnosis not present

## 2022-06-15 ENCOUNTER — Telehealth: Payer: Self-pay | Admitting: Emergency Medicine

## 2022-06-15 ENCOUNTER — Ambulatory Visit
Admission: EM | Admit: 2022-06-15 | Discharge: 2022-06-15 | Disposition: A | Discharge status: Home / Self Care | Payer: Medicare Other | Attending: Family Medicine | Admitting: Family Medicine

## 2022-06-15 DIAGNOSIS — H1132 Conjunctival hemorrhage, left eye: Secondary | ICD-10-CM | POA: Diagnosis not present

## 2022-06-15 MED ORDER — ERYTHROMYCIN 5 MG/GM OP OINT: TOPICAL_OINTMENT | OPHTHALMIC | 0 refills | Status: AC

## 2022-06-15 NOTE — Discharge Instructions (Signed)
You may apply cool compresses off-and-on, use the ointment to protect against infection and irritation and follow-up if your symptoms worsen at any time.

## 2022-06-15 NOTE — Telephone Encounter (Signed)
Pt emergency contact called and reported that pt started using px ointment and reports left eye pain. Consulted PA and reported to advise emergency contact that pt can stop ointment,use otc hydration drops,if pain persists was advised to seek medical care at ED. Pt family verbalized understanding.

## 2022-06-15 NOTE — ED Provider Notes (Signed)
[facility]    CSN: [id number] Arrival date & time: 06/15/22  0906      History   Chief Complaint Chief Complaint  Patient presents with   Eye Problem    Left eye has a busted blood vessel x3 days.    HPI Sheryl [name] is a 66 y.o. female.   Patient presenting today with 3-day history of localized left eye redness, mild itching.  Denies visual change, injury to the eye, discharge, headache, nausea, vomiting.  So far not trying anything over-the-counter for symptoms.  No past history of similar issues.    Past Medical History:  Diagnosis Date   Chronic back pain 10/22/2011   Chronic neck pain 10/22/2011   Depression    HLD (hyperlipidemia)    Hypertension     Patient Active Problem List   Diagnosis Date Noted   History of wheezing 11/15/2021   Dental abscess 11/15/2021   Colon cancer screening 09/03/2021   Pain, dental 10/12/2020   Hyperlipemia 09/07/2020   Borderline diabetes 09/06/2020   Bullous impetigo 07/26/2015   Mixed incontinence urge and stress 07/25/2015   Rotator cuff syndrome of left shoulder 12/19/2014   Difficulty in walking(719.7) 05/06/2012   Stiffness of vertebral column 05/06/2012   Decreased strength 05/06/2012   Class 3 obesity ([place]) 04/16/2012   MDD (major depressive disorder) 04/16/2012   Essential hypertension, benign 04/16/2012   Back pain 04/16/2012   Cervical strain, acute 04/16/2012    Past Surgical History:  Procedure Laterality Date   BREAST CYST EXCISION     right   COLONOSCOPY     per patient, many years ago at [facility] [place] N/A 11/16/2021   Procedure: COLONOSCOPY WITH PROPOFOL;  Surgeon: Daneil Dolin, MD;  [facility];  Service: Endoscopy;  Laterality: N/A;  9:15am   POLYPECTOMY  11/16/2021   Procedure: POLYPECTOMY;  Surgeon: Daneil Dolin, MD;  Location: [facility];  Service: Endoscopy;;   TUBAL LIGATION      OB History   No obstetric history on file.       Home Medications    Prior to Admission medications   Medication Sig Start Date End Date Taking? Authorizing Provider  albuterol (VENTOLIN HFA) 108 (90 Base) MCG/ACT inhaler INHALE 2 PUFFS INTO THE LUNGS EVERY 4 HOURS AS NEEDED FOR WHEEZING OR SHORTNESS OF BREATH 11/12/21  Yes Eulogio Bear, NP  Aspirin-Salicylamide-Caffeine (BC HEADACHE POWDER PO) Take 2 packets by mouth daily as needed (headaches).   Yes [provider]  atorvastatin (LIPITOR) 10 MG tablet Take 1 tablet (10 mg total) by mouth at bedtime. 04/27/21  Yes Eulogio Bear, NP  clotrimazole-betamethasone (LOTRISONE) cream Apply 1 application topically 2 (two) times daily. Patient taking differently: Apply 1 application  topically 2 (two) times daily as needed (skin irritation/rash). 04/27/21  Yes Eulogio Bear, NP  erythromycin ophthalmic ointment Place a 1/2 inch ribbon of ointment into the left lower eyelid BID prn. 06/15/22  Yes Volney American, PA-C  escitalopram (LEXAPRO) 10 MG tablet TAKE 1 TABLET BY MOUTH DAILY AS DIRECTED FOR MOO. *PT NEEDS NEW PCP* 02/11/22  Yes Susy Frizzle, MD  hydrochlorothiazide (HYDRODIURIL) 25 MG tablet TAKE 1 TABLET BY MOUTH IN THE MORNING FOR BLOOD PRESSURE 11/12/21  Yes Eulogio Bear, NP  mirabegron ER (MYRBETRIQ) 25 MG TB24 tablet TAKE 1 TABLET BY MOUTH AT BEDTIME FOR YOUR BLADDER 09/06/20  Yes [place], Modena Nunnery, MD  Family History Family History  Problem Relation Age of Onset   Hypertension Mother    Depression Mother    Hypertension Sister    Hypertension Sister    Colon cancer Neg Hx     Social History Social History   Tobacco Use   Smoking status: Never   Smokeless tobacco: Never  Vaping Use   Vaping Use: Never used  Substance Use Topics   Alcohol use: No    Alcohol/week: 0.0 standard drinks of alcohol   Drug use: No     Allergies   Patient has no known allergies.   Review of Systems Review of Systems PER HPI  Physical  Exam Triage Vital Signs ED Triage Vitals  Enc Vitals Group     BP 06/15/22 0939 135/86     Pulse Rate 06/15/22 0939 67     Resp --      Temp 06/15/22 0939 98.3 F (36.8 C)     Temp Source 06/15/22 0939 Oral     SpO2 06/15/22 0939 97 %     Weight --      Height --      Head Circumference --      Peak Flow --      Pain Score 06/15/22 0937 0     Pain Loc --      Pain Edu? --      Excl. in [place]? --    No data found.  Updated Vital Signs BP 135/86 (BP Location: Right Arm)   Pulse 67   Temp 98.3 F (36.8 C) (Oral)   LMP 12/20/2011   SpO2 97%   Visual Acuity Right Eye Distance:   Left Eye Distance:   Bilateral Distance:    Right Eye Near:   Left Eye Near:    Bilateral Near:     Physical Exam Vitals and nursing note reviewed.  Constitutional:      Appearance: Normal appearance. She is not ill-appearing.  HENT:     Head: Atraumatic.     Nose: Nose normal.     Mouth/Throat:     Mouth: Mucous membranes are moist.  Eyes:     Extraocular Movements: Extraocular movements intact.     Pupils: Pupils are equal, round, and reactive to light.     Comments: Subconjunctival hemorrhage to the left medial eye  Cardiovascular:     Rate and Rhythm: Normal rate.  Pulmonary:     Effort: Pulmonary effort is normal. No respiratory distress.  Musculoskeletal:        General: Normal range of motion.     Cervical back: Normal range of motion and neck supple.  Skin:    General: Skin is warm and dry.  Neurological:     Mental Status: She is alert and oriented to person, place, and time.  Psychiatric:        Mood and Affect: Mood normal.        Thought Content: Thought content normal.        Judgment: Judgment normal.      UC Treatments / Results  Labs (all labs ordered are listed, but only abnormal results are displayed) Labs Reviewed - No data to display  EKG   Radiology No results found.  Procedures Procedures (including critical care time)  Medications Ordered in  UC Medications - No data to display  Initial Impression / Assessment and Plan / UC Course  I have reviewed the triage vital signs and the nursing notes.  Pertinent labs & imaging  results that were available during my care of the patient were reviewed by me and considered in my medical decision making (see chart for details).     Overall exam reassuring, no foreign body on exam of the eye and no red flag findings.  Visual acuity 20/60 without correction, states she has an appointment with her eye specialist next month and that this is her baseline vision.  Cool compresses, erythromycin ointment to protect against infection and provide some comfort and follow-up for any worsening symptoms.  Final Clinical Impressions(s) / UC Diagnoses   Final diagnoses:  Subconjunctival hemorrhage of left eye     Discharge Instructions      You may apply cool compresses off-and-on, use the ointment to protect against infection and irritation and follow-up if your symptoms worsen at any time.    ED Prescriptions     Medication Sig Dispense Auth. Provider   erythromycin ophthalmic ointment Place a 1/2 inch ribbon of ointment into the left lower eyelid BID prn. 3.5 g Volney American, PA-C      PDMP not reviewed this encounter.   Volney American, [place] 06/15/22 1004

## 2022-06-15 NOTE — ED Triage Notes (Signed)
Pt states her left eye has been blood shot red x3 days.   Denies Meds

## 2022-07-05 DIAGNOSIS — I10 Essential (primary) hypertension: Secondary | ICD-10-CM | POA: Diagnosis not present

## 2022-07-05 DIAGNOSIS — J4520 Mild intermittent asthma, uncomplicated: Secondary | ICD-10-CM | POA: Diagnosis not present

## 2022-08-02 DIAGNOSIS — R030 Elevated blood-pressure reading, without diagnosis of hypertension: Secondary | ICD-10-CM | POA: Diagnosis not present

## 2022-08-06 ENCOUNTER — Other Ambulatory Visit: Payer: Self-pay | Admitting: Family Medicine

## 2022-08-06 DIAGNOSIS — F332 Major depressive disorder, recurrent severe without psychotic features: Secondary | ICD-10-CM

## 2022-08-06 NOTE — Telephone Encounter (Signed)
Requested Prescriptions  Pending Prescriptions Disp Refills  . escitalopram (LEXAPRO) 10 MG tablet [Pharmacy Med Name: ESCITALOPRAM '10MG'$  TABLETS] 30 tablet 0    Sig: TAKE 1 TABLET BY MOUTH DAILY     Psychiatry:  Antidepressants - SSRI Failed - 08/06/2022  5:48 PM      Failed - Valid encounter within last 6 months    Recent Outpatient Visits          8 months ago Encounter for annual wellness exam in Medicare patient   [place] [facility] [place] [facility], NP   12 months ago Severe episode of recurrent major depressive disorder, without psychotic features ([place])   [place] Eulogio Bear, NP   1 year ago Essential hypertension, benign   [place] Eulogio Bear, NP   1 year ago Breast mass, right   [place] Eulogio Bear, NP   1 year ago History of [place], Modena Nunnery, MD             Passed - Completed PHQ-2 or PHQ-9 in the last 360 days

## 2022-08-16 DIAGNOSIS — F32A Depression, unspecified: Secondary | ICD-10-CM | POA: Diagnosis not present

## 2022-08-16 DIAGNOSIS — E7800 Pure hypercholesterolemia, unspecified: Secondary | ICD-10-CM | POA: Diagnosis not present

## 2022-08-16 DIAGNOSIS — I10 Essential (primary) hypertension: Secondary | ICD-10-CM | POA: Diagnosis not present

## 2022-09-24 DIAGNOSIS — U071 COVID-19: Secondary | ICD-10-CM | POA: Diagnosis not present

## 2022-09-24 DIAGNOSIS — R030 Elevated blood-pressure reading, without diagnosis of hypertension: Secondary | ICD-10-CM | POA: Diagnosis not present

## 2022-12-13 DIAGNOSIS — I10 Essential (primary) hypertension: Secondary | ICD-10-CM | POA: Diagnosis not present

## 2022-12-13 DIAGNOSIS — E1169 Type 2 diabetes mellitus with other specified complication: Secondary | ICD-10-CM | POA: Diagnosis not present

## 2022-12-13 DIAGNOSIS — I119 Hypertensive heart disease without heart failure: Secondary | ICD-10-CM | POA: Diagnosis not present

## 2022-12-17 DIAGNOSIS — E1169 Type 2 diabetes mellitus with other specified complication: Secondary | ICD-10-CM | POA: Diagnosis not present

## 2022-12-17 DIAGNOSIS — I119 Hypertensive heart disease without heart failure: Secondary | ICD-10-CM | POA: Diagnosis not present

## 2023-01-21 ENCOUNTER — Encounter: Payer: Self-pay | Admitting: Emergency Medicine

## 2023-01-21 ENCOUNTER — Other Ambulatory Visit: Payer: Self-pay

## 2023-01-21 ENCOUNTER — Ambulatory Visit
Admission: EM | Admit: 2023-01-21 | Discharge: 2023-01-21 | Disposition: A | Discharge status: Home / Self Care | Payer: 59 | Attending: Family Medicine | Admitting: Family Medicine

## 2023-01-21 DIAGNOSIS — J3089 Other allergic rhinitis: Secondary | ICD-10-CM | POA: Diagnosis not present

## 2023-01-21 DIAGNOSIS — J069 Acute upper respiratory infection, unspecified: Secondary | ICD-10-CM

## 2023-01-21 MED ORDER — CETIRIZINE HCL 10 MG PO TABS
10.0000 mg | ORAL_TABLET | Freq: Every day | ORAL | 2 refills | Status: AC
Start: 2023-01-21 — End: ?

## 2023-01-21 MED ORDER — PREDNISONE 20 MG PO TABS: 40.0000 mg | ORAL_TABLET | Freq: Every day | ORAL | 0 refills | Status: DC

## 2023-01-21 MED ORDER — FLUTICASONE PROPIONATE 50 MCG/ACT NA SUSP
1.0000 | Freq: Two times a day (BID) | NASAL | 2 refills | Status: DC
Start: 2023-01-21 — End: 2023-03-05

## 2023-01-21 MED ORDER — GUAIFENESIN ER 600 MG PO TB12: 600.0000 mg | ORAL_TABLET | Freq: Two times a day (BID) | ORAL | 0 refills | Status: AC | PRN

## 2023-01-21 NOTE — ED Provider Notes (Signed)
[facility]    CSN: [id number] Arrival date & time: 01/21/23  [id number]      History   Chief Complaint Chief Complaint  Patient presents with   Cough    HPI Sheryl [name] is a 67 y.o. female.   Patient presenting today with 3-week history of sore throat, productive cough, headache, sinus pressure, bilateral ear pressure, chills, back and leg aching.  Denies fever, chest pain, shortness of breath, abdominal pain, nausea vomiting or diarrhea.  So far trying antihistamines with no relief.  History of seasonal allergies, no known sick contacts recently.    Past Medical History:  Diagnosis Date   Chronic back pain 10/22/2011   Chronic neck pain 10/22/2011   Depression    HLD (hyperlipidemia)    Hypertension    Patient Active Problem List   Diagnosis Date Noted   History of wheezing 11/15/2021   Dental abscess 11/15/2021   Colon cancer screening 09/03/2021   Pain, dental 10/12/2020   Hyperlipemia 09/07/2020   Borderline diabetes 09/06/2020   Bullous impetigo 07/26/2015   Mixed incontinence urge and stress 07/25/2015   Rotator cuff syndrome of left shoulder 12/19/2014   Difficulty in walking(719.7) 05/06/2012   Stiffness of vertebral column 05/06/2012   Decreased strength 05/06/2012   Class 3 obesity 04/16/2012   MDD (major depressive disorder) 04/16/2012   Essential hypertension, benign 04/16/2012   Back pain 04/16/2012   Cervical strain, acute 04/16/2012    Past Surgical History:  Procedure Laterality Date   BREAST CYST EXCISION     right   COLONOSCOPY     per patient, many years ago at [facility] N/A 11/16/2021   Procedure: COLONOSCOPY WITH PROPOFOL;  Surgeon: Daneil Dolin, MD;  [facility];  Service: Endoscopy;  Laterality: N/A;  9:15am   POLYPECTOMY  11/16/2021   Procedure: POLYPECTOMY;  Surgeon: Daneil Dolin, MD;  [facility];  Service: Endoscopy;;   TUBAL LIGATION      OB History   No  obstetric history on file.      Home Medications    Prior to Admission medications   Medication Sig Start Date End Date Taking? Authorizing Provider  cetirizine (ZYRTEC ALLERGY) 10 MG tablet Take 1 tablet (10 mg total) by mouth daily. 01/21/23  Yes Volney American, PA-C  fluticasone [facility]) 50 MCG/ACT nasal spray Place 1 spray into both nostrils 2 (two) times daily. 01/21/23  Yes Volney American, PA-C  guaiFENesin (MUCINEX) 600 MG 12 hr tablet Take 1 tablet (600 mg total) by mouth 2 (two) times daily as needed. 01/21/23  Yes Volney American, PA-C  predniSONE (DELTASONE) 20 MG tablet Take 2 tablets (40 mg total) by mouth daily with breakfast. 01/21/23  Yes Volney American, PA-C  albuterol (VENTOLIN HFA) 108 (90 Base) MCG/ACT inhaler INHALE 2 PUFFS INTO THE LUNGS EVERY 4 HOURS AS NEEDED FOR WHEEZING OR SHORTNESS OF BREATH 11/12/21   Eulogio Bear, NP  Aspirin-Salicylamide-Caffeine (BC HEADACHE POWDER PO) Take 2 packets by mouth daily as needed (headaches).    [provider]  atorvastatin (LIPITOR) 10 MG tablet Take 1 tablet (10 mg total) by mouth at bedtime. 04/27/21   Eulogio Bear, NP  clotrimazole-betamethasone (LOTRISONE) cream Apply 1 application topically 2 (two) times daily. Patient taking differently: Apply 1 application  topically 2 (two) times daily as needed (skin irritation/rash). 04/27/21   Eulogio Bear, NP  erythromycin ophthalmic ointment Place a  1/2 inch ribbon of ointment into the left lower eyelid BID prn. 06/15/22   Volney American, PA-C  escitalopram (LEXAPRO) 10 MG tablet TAKE 1 TABLET BY MOUTH DAILY AS DIRECTED FOR MOO. *PT NEEDS NEW PCP* 02/11/22   Susy Frizzle, MD  hydrochlorothiazide (HYDRODIURIL) 25 MG tablet TAKE 1 TABLET BY MOUTH IN THE MORNING FOR BLOOD PRESSURE 11/12/21   Eulogio Bear, NP  mirabegron ER (MYRBETRIQ) 25 MG TB24 tablet TAKE 1 TABLET BY MOUTH AT BEDTIME FOR YOUR BLADDER 09/06/20   Alycia Rossetti, MD    Family History Family History  Problem Relation Age of Onset   Hypertension Mother    Depression Mother    Hypertension Sister    Hypertension Sister    Colon cancer Neg Hx     Social History Social History   Tobacco Use   Smoking status: Never   Smokeless tobacco: Never  Vaping Use   Vaping Use: Never used  Substance Use Topics   Alcohol use: No    Alcohol/week: 0.0 standard drinks of alcohol   Drug use: No     Allergies   Patient has no known allergies.   Review of Systems Review of Systems PER HPI  Physical Exam Triage Vital Signs ED Triage Vitals  Enc Vitals Group     BP 01/21/23 0815 (!) 152/93     Pulse Rate 01/21/23 0815 74     Resp 01/21/23 0815 20     Temp 01/21/23 0815 98.6 F (37 C)     Temp Source 01/21/23 0815 Oral     SpO2 01/21/23 0815 97 %     Weight --      Height --      Head Circumference --      Peak Flow --      Pain Score 01/21/23 0813 6     Pain Loc --      Pain Edu? --      Excl. in [place]? --    No data found.  Updated Vital Signs BP (!) 152/93 (BP Location: Right Arm)   Pulse 74   Temp 98.6 F (37 C) (Oral)   Resp 20   LMP 12/20/2011   SpO2 97%   Visual Acuity Right Eye Distance:   Left Eye Distance:   Bilateral Distance:    Right Eye Near:   Left Eye Near:    Bilateral Near:     Physical Exam Vitals and nursing note reviewed.  Constitutional:      Appearance: Normal appearance.  HENT:     Head: Atraumatic.     Right Ear: Tympanic membrane and external ear normal.     Left Ear: Tympanic membrane and external ear normal.     Nose: Rhinorrhea present.     Mouth/Throat:     Mouth: Mucous membranes are moist.     Pharynx: Posterior oropharyngeal erythema present.  Eyes:     Extraocular Movements: Extraocular movements intact.     Conjunctiva/sclera: Conjunctivae normal.  Cardiovascular:     Rate and Rhythm: Normal rate and regular rhythm.     Heart sounds: Normal heart sounds.  Pulmonary:      Effort: Pulmonary effort is normal.     Breath sounds: Normal breath sounds. No wheezing.  Musculoskeletal:        General: Normal range of motion.     Cervical back: Normal range of motion and neck supple.  Skin:    General: Skin is warm and dry.  Neurological:     Mental Status: She is alert and oriented to person, place, and time.  Psychiatric:        Mood and Affect: Mood normal.        Thought Content: Thought content normal.      UC Treatments / Results  Labs (all labs ordered are listed, but only abnormal results are displayed) Labs Reviewed - No data to display  EKG   Radiology No results found.  Procedures Procedures (including critical care time)  Medications Ordered in UC Medications - No data to display  Initial Impression / Assessment and Plan / UC Course  I have reviewed the triage vital signs and the nursing notes.  Pertinent labs & imaging results that were available during my care of the patient were reviewed by me and considered in my medical decision making (see chart for details).     Suspect symptoms are viral versus allergic.  Treat with a short course of prednisone, Flonase, Zyrtec, Mucinex, supportive over-the-counter medications and home care.  Return for worsening symptoms. Final Clinical Impressions(s) / UC Diagnoses   Final diagnoses:  Viral URI with cough  Seasonal allergic rhinitis due to other allergic trigger   Discharge Instructions   None    ED Prescriptions     Medication Sig Dispense Auth. Provider   predniSONE (DELTASONE) 20 MG tablet Take 2 tablets (40 mg total) by mouth daily with breakfast. 6 tablet Volney American, PA-C   fluticasone [facility]) 50 MCG/ACT nasal spray Place 1 spray into both nostrils 2 (two) times daily. 16 g Volney American, [place]   cetirizine (ZYRTEC ALLERGY) 10 MG tablet Take 1 tablet (10 mg total) by mouth daily. 30 tablet Volney American, [place]   guaiFENesin (MUCINEX) 600 MG  12 hr tablet Take 1 tablet (600 mg total) by mouth 2 (two) times daily as needed. 20 tablet Volney American, [place]      PDMP not reviewed this encounter.   Volney American, [place] 01/21/23 1219

## 2023-01-21 NOTE — ED Triage Notes (Addendum)
Pt reports sore throat, chest congestion, headache, bilateral ear pain, cough for last several weeks. Negative home covid test.

## 2023-03-05 ENCOUNTER — Ambulatory Visit
Admission: EM | Admit: 2023-03-05 | Discharge: 2023-03-05 | Disposition: A | Discharge status: Home / Self Care | Payer: 59 | Attending: Nurse Practitioner | Admitting: Nurse Practitioner

## 2023-03-05 DIAGNOSIS — Z889 Allergy status to unspecified drugs, medicaments and biological substances status: Secondary | ICD-10-CM

## 2023-03-05 DIAGNOSIS — J069 Acute upper respiratory infection, unspecified: Secondary | ICD-10-CM

## 2023-03-05 LAB — POCT RAPID STREP A (OFFICE): Rapid Strep A Screen: NEGATIVE

## 2023-03-05 MED ORDER — PREDNISONE 20 MG PO TABS
40.0000 mg | ORAL_TABLET | Freq: Every day | ORAL | 0 refills | Status: AC
Start: 2023-03-05 — End: 2023-03-10

## 2023-03-05 MED ORDER — LIDOCAINE VISCOUS HCL 2 % MT SOLN: 5.0000 mL | OROMUCOSAL | 0 refills | Status: AC | PRN

## 2023-03-05 MED ORDER — ALBUTEROL SULFATE HFA 108 (90 BASE) MCG/ACT IN AERS: 2.0000 | INHALATION_SPRAY | Freq: Four times a day (QID) | RESPIRATORY_TRACT | 0 refills | Status: DC | PRN

## 2023-03-05 MED ORDER — FLUTICASONE PROPIONATE 50 MCG/ACT NA SUSP: 2.0000 | Freq: Every day | NASAL | 0 refills | Status: AC

## 2023-03-05 MED ORDER — PROMETHAZINE-DM 6.25-15 MG/5ML PO SYRP: 5.0000 mL | ORAL_SOLUTION | Freq: Four times a day (QID) | ORAL | 0 refills | Status: AC | PRN

## 2023-03-05 NOTE — Discharge Instructions (Addendum)
Take medication as prescribed. Increase fluids and allow for plenty of rest. Recommend over-the-counter Tylenol as needed for pain, fever, or general discomfort. Warm salt water gargles 3-4 times daily to help with throat pain or discomfort. Recommend a diet with soft foods to include soups, broths, puddings, yogurt, Jell-O's, or popsicles until symptoms improve. Recommend using a humidifier at bedtime and sleeping elevated on pillows while coughing persists.  As discussed, if symptoms do not improve over the next 7 to 10 days, please follow-up in this clinic or with your primary care physician for further evaluation. Follow-up sooner if symptoms worsen.  Follow-up as needed.

## 2023-03-05 NOTE — ED Provider Notes (Signed)
[facility]    CSN: [id number] Arrival date & time: 03/05/23  1806      History   Chief Complaint No chief complaint on file.   HPI Sheryl [name] is a 67 y.o. female.   The history is provided by the patient and a relative (daughter).   The patient presents with a 3-day history of fever, chills, fatigue, sore throat, bilateral ear pain, nasal congestion, and cough.  Patient reports subjective fever on the first day of her symptoms.  She denies headache, ear drainage, wheezing, shortness of breath, difficulty breathing, abdominal pain, nausea, vomiting, or diarrhea.  Patient reports a history of seasonal allergies.  She reports she has taken Zyrtec for her symptoms.  Patient reports that she took a home COVID test today which was negative.  Patient was there was a young child at her house who was sick, patient states the child's mother told her it was the child's asthma.  Past Medical History:  Diagnosis Date   Chronic back pain 10/22/2011   Chronic neck pain 10/22/2011   Depression    HLD (hyperlipidemia)    Hypertension     Patient Active Problem List   Diagnosis Date Noted   History of wheezing 11/15/2021   Dental abscess 11/15/2021   Colon cancer screening 09/03/2021   Pain, dental 10/12/2020   Hyperlipemia 09/07/2020   Borderline diabetes 09/06/2020   Bullous impetigo 07/26/2015   Mixed incontinence urge and stress 07/25/2015   Rotator cuff syndrome of left shoulder 12/19/2014   Difficulty in walking(719.7) 05/06/2012   Stiffness of vertebral column 05/06/2012   Decreased strength 05/06/2012   Class 3 obesity (HCC) 04/16/2012   MDD (major depressive disorder) 04/16/2012   Essential hypertension, benign 04/16/2012   Back pain 04/16/2012   Cervical strain, acute 04/16/2012    Past Surgical History:  Procedure Laterality Date   BREAST CYST EXCISION     right   COLONOSCOPY     per patient, many years ago at [facility]   COLONOSCOPY WITH  PROPOFOL N/A 11/16/2021   Procedure: COLONOSCOPY WITH PROPOFOL;  Surgeon: Corbin Ade, MD;  [facility];  Service: Endoscopy;  Laterality: N/A;  9:15am   POLYPECTOMY  11/16/2021   Procedure: POLYPECTOMY;  Surgeon: Corbin Ade, MD;  [facility];  Service: Endoscopy;;   TUBAL LIGATION      OB History   No obstetric history on file.      Home Medications    Prior to Admission medications   Medication Sig Start Date End Date Taking? Authorizing Provider  albuterol (VENTOLIN HFA) 108 (90 Base) MCG/ACT inhaler INHALE 2 PUFFS INTO THE LUNGS EVERY 4 HOURS AS NEEDED FOR WHEEZING OR SHORTNESS OF BREATH 11/12/21   Valentino Nose, NP  Aspirin-Salicylamide-Caffeine (BC HEADACHE POWDER PO) Take 2 packets by mouth daily as needed (headaches).    [provider]  atorvastatin (LIPITOR) 10 MG tablet Take 1 tablet (10 mg total) by mouth at bedtime. 04/27/21   Valentino Nose, NP  cetirizine (ZYRTEC ALLERGY) 10 MG tablet Take 1 tablet (10 mg total) by mouth daily. 01/21/23   Particia Nearing, PA-C  clotrimazole-betamethasone (LOTRISONE) cream Apply 1 application topically 2 (two) times daily. Patient taking differently: Apply 1 application  topically 2 (two) times daily as needed (skin irritation/rash). 04/27/21   Valentino Nose, NP  erythromycin ophthalmic ointment Place a 1/2 inch ribbon of ointment into the left lower eyelid BID prn. 06/15/22  Particia Nearing, PA-C  escitalopram (LEXAPRO) 10 MG tablet TAKE 1 TABLET BY MOUTH DAILY AS DIRECTED FOR MOO. *PT NEEDS NEW PCP* 02/11/22   Donita Brooks, MD  fluticasone (FLONASE) 50 MCG/ACT nasal spray Place 1 spray into both nostrils 2 (two) times daily. 01/21/23   Particia Nearing, PA-C  guaiFENesin (MUCINEX) 600 MG 12 hr tablet Take 1 tablet (600 mg total) by mouth 2 (two) times daily as needed. 01/21/23   Particia Nearing, PA-C  hydrochlorothiazide (HYDRODIURIL) 25 MG tablet TAKE 1 TABLET BY  MOUTH IN THE MORNING FOR BLOOD PRESSURE 11/12/21   Valentino Nose, NP  mirabegron ER (MYRBETRIQ) 25 MG TB24 tablet TAKE 1 TABLET BY MOUTH AT BEDTIME FOR YOUR BLADDER 09/06/20   Salley Scarlet, MD  predniSONE (DELTASONE) 20 MG tablet Take 2 tablets (40 mg total) by mouth daily with breakfast. 01/21/23   Particia Nearing, PA-C    Family History Family History  Problem Relation Age of Onset   Hypertension Mother    Depression Mother    Hypertension Sister    Hypertension Sister    Colon cancer Neg Hx     Social History Social History   Tobacco Use   Smoking status: Never   Smokeless tobacco: Never  Vaping Use   Vaping Use: Never used  Substance Use Topics   Alcohol use: No    Alcohol/week: 0.0 standard drinks of alcohol   Drug use: No     Allergies   Patient has no known allergies.   Review of Systems Review of Systems Per HPI  Physical Exam Triage Vital Signs ED Triage Vitals  Enc Vitals Group     BP 03/05/23 1814 (!) 142/88     Pulse Rate 03/05/23 1814 97     Resp 03/05/23 1814 15     Temp 03/05/23 1814 99.3 F (37.4 C)     Temp Source 03/05/23 1814 Oral     SpO2 03/05/23 1814 96 %     Weight --      Height --      Head Circumference --      Peak Flow --      Pain Score 03/05/23 1818 10     Pain Loc --      Pain Edu? --      Excl. in GC? --    No data found.  Updated Vital Signs BP (!) 142/88 (BP Location: Right Arm)   Pulse 97   Temp 99.3 F (37.4 C) (Oral)   Resp 15   LMP 12/20/2011   SpO2 96%   Visual Acuity Right Eye Distance:   Left Eye Distance:   Bilateral Distance:    Right Eye Near:   Left Eye Near:    Bilateral Near:     Physical Exam Vitals and nursing note reviewed.  Constitutional:      General: She is not in acute distress.    Appearance: Normal appearance.  HENT:     Head: Normocephalic.     Right Ear: Tympanic membrane, ear canal and external ear normal.     Left Ear: Tympanic membrane, ear canal and  external ear normal.     Nose: Congestion present.     Mouth/Throat:     Mouth: Mucous membranes are moist.     Pharynx: Posterior oropharyngeal erythema present. No oropharyngeal exudate.     Comments: Cobblestoning present on posterior oropharynx Eyes:     Extraocular Movements: Extraocular movements intact.  Conjunctiva/sclera: Conjunctivae normal.     Pupils: Pupils are equal, round, and reactive to light.  Cardiovascular:     Rate and Rhythm: Normal rate and regular rhythm.     Pulses: Normal pulses.     Heart sounds: Normal heart sounds.  Pulmonary:     Effort: Pulmonary effort is normal. No respiratory distress.     Breath sounds: Normal breath sounds. No stridor. No wheezing, rhonchi or rales.  Abdominal:     General: Bowel sounds are normal.     Palpations: Abdomen is soft.     Tenderness: There is no abdominal tenderness.  Musculoskeletal:     Cervical back: Normal range of motion.  Lymphadenopathy:     Cervical: No cervical adenopathy.  Skin:    General: Skin is warm and dry.  Neurological:     General: No focal deficit present.     Mental Status: She is alert and oriented to person, place, and time.  Psychiatric:        Mood and Affect: Mood normal.        Behavior: Behavior normal.      UC Treatments / Results  Labs (all labs ordered are listed, but only abnormal results are displayed) Labs Reviewed  POCT RAPID STREP A (OFFICE)    EKG   Radiology No results found.  Procedures Procedures (including critical care time)  Medications Ordered in UC Medications - No data to display  Initial Impression / Assessment and Plan / UC Course  I have reviewed the triage vital signs and the nursing notes.  Pertinent labs & imaging results that were available during my care of the patient were reviewed by me and considered in my medical decision making (see chart for details).  The patient is well-appearing, she is in no acute distress, vital signs are  stable.  Suspect a viral upper respiratory infection with cough.  Patient with history of underlying seasonal allergies, which may also be triggered at this time.  Will treat with viscous lidocaine 2% for her throat pain, cetirizine 10 mg to act as an antihistamine, fluticasone 50 mcg nasal spray for nasal congestion and runny nose, Promethazine DM for her cough, prednisone 40 mg for chronic allergic rhinitis, and an albuterol inhaler as needed for wheezing.  Patient was given supportive care recommendations to include increasing fluids, allowing for plenty of rest, and over-the-counter analgesics for pain or discomfort.  Patient was given strict follow-up precautions.  Patient and daughter are in agreement with this plan of care and verbalized understanding.  All questions were answered.  Patient stable for discharge.   Final Clinical Impressions(s) / UC Diagnoses   Final diagnoses:  None   Discharge Instructions   None    ED Prescriptions   None    PDMP not reviewed this encounter.   Abran Cantor, NP 03/05/23 [phone]

## 2023-03-05 NOTE — ED Triage Notes (Signed)
Pt c/o sore throat, ear pain, congestion chills,fever fatigue  x 3 days

## 2023-03-14 DIAGNOSIS — J4520 Mild intermittent asthma, uncomplicated: Secondary | ICD-10-CM | POA: Diagnosis not present

## 2023-03-14 DIAGNOSIS — E782 Mixed hyperlipidemia: Secondary | ICD-10-CM | POA: Diagnosis not present

## 2023-03-14 DIAGNOSIS — E1169 Type 2 diabetes mellitus with other specified complication: Secondary | ICD-10-CM | POA: Diagnosis not present

## 2023-03-14 DIAGNOSIS — J301 Allergic rhinitis due to pollen: Secondary | ICD-10-CM | POA: Diagnosis not present

## 2023-03-14 DIAGNOSIS — F32A Depression, unspecified: Secondary | ICD-10-CM | POA: Diagnosis not present

## 2023-03-14 DIAGNOSIS — I10 Essential (primary) hypertension: Secondary | ICD-10-CM | POA: Diagnosis not present

## 2023-04-25 DIAGNOSIS — E1169 Type 2 diabetes mellitus with other specified complication: Secondary | ICD-10-CM | POA: Diagnosis not present

## 2023-04-25 DIAGNOSIS — E782 Mixed hyperlipidemia: Secondary | ICD-10-CM | POA: Diagnosis not present

## 2023-04-25 DIAGNOSIS — F32A Depression, unspecified: Secondary | ICD-10-CM | POA: Diagnosis not present

## 2023-04-25 DIAGNOSIS — I10 Essential (primary) hypertension: Secondary | ICD-10-CM | POA: Diagnosis not present

## 2023-07-04 DIAGNOSIS — E1169 Type 2 diabetes mellitus with other specified complication: Secondary | ICD-10-CM | POA: Diagnosis not present

## 2023-07-04 DIAGNOSIS — E7800 Pure hypercholesterolemia, unspecified: Secondary | ICD-10-CM | POA: Diagnosis not present

## 2023-07-04 DIAGNOSIS — I10 Essential (primary) hypertension: Secondary | ICD-10-CM | POA: Diagnosis not present

## 2023-07-04 DIAGNOSIS — Z0001 Encounter for general adult medical examination with abnormal findings: Secondary | ICD-10-CM | POA: Diagnosis not present

## 2023-08-26 DIAGNOSIS — E119 Type 2 diabetes mellitus without complications: Secondary | ICD-10-CM | POA: Diagnosis not present

## 2023-09-25 DIAGNOSIS — I10 Essential (primary) hypertension: Secondary | ICD-10-CM | POA: Diagnosis not present

## 2023-09-25 DIAGNOSIS — E1169 Type 2 diabetes mellitus with other specified complication: Secondary | ICD-10-CM | POA: Diagnosis not present

## 2023-11-22 NOTE — Therapy (Signed)
 OUTPATIENT PHYSICAL THERAPY LOWER EXTREMITY EVALUATION   Patient Name: Sheryl [name] MRN: [id number] DOB:12/05/1955, 68 y.o., female Today's Date: 11/27/2023  END OF SESSION:  PT End of Session - 11/27/23 0729     Visit Number 1    Number of Visits 8    Date for PT Re-Evaluation 12/26/23    Authorization Type [employer] Medicare    Authorization Time Period please check auth    PT Start Time 0730    PT Stop Time 0810    PT Time Calculation (min) 40 min    Activity Tolerance Other (comment)   patient emotional during assessment; better when daughter arrives   Behavior During Therapy Anxious             Past Medical History:  Diagnosis Date   Chronic back pain 10/22/2011   Chronic neck pain 10/22/2011   Depression    HLD (hyperlipidemia)    Hypertension    Past Surgical History:  Procedure Laterality Date   BREAST CYST EXCISION     right   COLONOSCOPY     per patient, many years ago at [facility]   COLONOSCOPY WITH PROPOFOL  N/A 11/16/2021   Procedure: COLONOSCOPY WITH PROPOFOL ;  Surgeon: Sheryl Lamar HERO, MD;  [facility];  Service: Endoscopy;  Laterality: N/A;  9:15am   POLYPECTOMY  11/16/2021   Procedure: POLYPECTOMY;  Surgeon: Sheryl Lamar HERO, MD;  [facility];  Service: Endoscopy;;   TUBAL LIGATION     Patient Active Problem List   Diagnosis Date Noted   History of wheezing 11/15/2021   Dental abscess 11/15/2021   Colon cancer screening 09/03/2021   Pain, dental 10/12/2020   Hyperlipemia 09/07/2020   Borderline diabetes 09/06/2020   Bullous impetigo 07/26/2015   Mixed incontinence urge and stress 07/25/2015   Rotator cuff syndrome of left shoulder 12/19/2014   Difficulty walking 05/06/2012   Stiffness of vertebral column 05/06/2012   Decreased strength 05/06/2012   Class 3 obesity 04/16/2012   MDD (major depressive disorder) 04/16/2012   Essential hypertension, benign 04/16/2012   Back pain 04/16/2012   Cervical strain, acute  04/16/2012    PCP: Sheryl Leech, MD  REFERRING PROVIDER: Leech Kennieth, MD REFERRING DIAG: R26.81 (ICD-10-CM) - Unsteady gait  THERAPY DIAG:  Difficulty in walking, not elsewhere classified  Unsteady gait  Rationale for Evaluation and Treatment: Rehabilitation  ONSET DATE: 1 year  SUBJECTIVE:   SUBJECTIVE STATEMENT: Patient reports she has a hard time walking/ keeping her balance sometimes; it's not all the time.  She has to furniture walk in her home sometimes to keep her balance.  Patient seems occasionally confused during subjective today. She states she sometimes walks sideways; seems unsure of any cause of her unbalance.  States she is borderline DM, She does take blood pressure medication.  Some right side shoulder and leg pain.  Daughters or sisters usually come to her appts with her.  She becomes emotional during interview this morning. Right handed; having trouble with controlling right arm BP left arm 150/96 has not taken her BP meds this morning; daughter arrives at appt. And states her daughters are concerned about her cognition and right side weakness, complaints of pain  PERTINENT HISTORY: Significant depression per her daughter  PAIN:  Are you having pain? Yes: NPRS scale: 8/10 right shoulder, 0-10/10 right leg Pain location: right shoulder and right leg Pain description: tight,  throbbing, aching Aggravating factors: leg worse at night, movement right arm, weaker Relieving factors:  massage, muscle rub, BC powder  PRECAUTIONS: Fall     WEIGHT BEARING RESTRICTIONS: No  FALLS:  Has patient fallen in last 6 months? No  LIVING ENVIRONMENT: Lives with: lives with an adult companion Lives in: House/apartment Stairs: Yes: External: 5 steps; on right going up, on left going up, and bilateral but cannot reach both Has following equipment at home: Single point cane and Quad cane small base  OCCUPATION: not working  PLOF: Independent  PATIENT GOALS: walk without  worrying about falling  NEXT MD VISIT: 11/28/23  OBJECTIVE:  Note: Objective measures were completed at Evaluation unless otherwise noted.  DIAGNOSTIC FINDINGS: none  PATIENT SURVEYS:  LEFS 38/80 47.5%  COGNITION: Overall cognitive status:  some difficulty answering more than yes/no questions; visibly frustrated and starts crying during subjective; improves when her daughter Sheryl [name] arrives who states her family is concerned about her cognition      SENSATION: Appears intact at eval  EDEMA:  None noted  POSTURE: rounded shoulders, forward head, and heavy chested  PALPATION: Not specifically tested today  LOWER EXTREMITY ROM:  Active ROM Right eval Left eval  Hip flexion    Hip extension    Hip abduction    Hip adduction    Hip internal rotation    Hip external rotation    Knee flexion    Knee extension    Ankle dorsiflexion    Ankle plantarflexion    Ankle inversion    Ankle eversion     (Blank rows = not tested)  LOWER EXTREMITY MMT:  MMT Right eval Left eval  Hip flexion 4 4-  Hip extension 3- 3-  Hip abduction    Hip adduction    Hip internal rotation    Hip external rotation    Knee flexion 4 4-  Knee extension 4+ 4+  Ankle dorsiflexion 4- 5  Ankle plantarflexion    Ankle inversion    Ankle eversion     (Blank rows = not tested)    FUNCTIONAL TESTS:  5 times sit to stand: 27.97 sec using hands to assist up to standin Timed up and go (TUG): 16.29  no AD SLS 3 each leg  GAIT: Distance walked: 50 ft in clinic Assistive device utilized: None Level of assistance: SBA Comments: touches wall as she walks; sometimes tends to walk with small base of support                                                                                                                                TREATMENT DATE: 11/27/23 physical therapy evaluation and HEP instruction    PATIENT EDUCATION:  Education details: Patient educated on exam findings, POC, scope of  PT, HEP, and benefit of using a cane for ambulation outside of home. Person educated: Patient Education method: Explanation, Demonstration, and Handouts Education comprehension: verbalized understanding, returned demonstration, verbal cues required, and tactile cues required  HOME EXERCISE PROGRAM: Access Code: [email or web address]  URL: [email or web address] Date: 11/27/2023 Prepared by: [facility]  Exercises - Sit to Stand with Armchair  - 2 x daily - 7 x weekly - 1 sets - 10 reps - standing single leg balance at the counter (try to not hold on)  - 2 x daily - 7 x weekly - 1 sets - 3 reps - 10 sec hold  ASSESSMENT:  CLINICAL IMPRESSION: Patient is a 68 y.o. female who was seen today for physical therapy evaluation and treatment for R26.81 (ICD-10-CM) - Unsteady gait.  Patient demonstrates decreased strength, balance deficits and gait abnormalities which are negatively impacting patient ability to perform ADLs and functional mobility tasks. Patient will benefit from skilled physical therapy services to address these deficits to improve level of function with ADLs, functional mobility tasks, and reduce risk for falls.    OBJECTIVE IMPAIRMENTS: Abnormal gait, decreased balance, decreased cognition, decreased knowledge of condition, decreased knowledge of use of DME, difficulty walking, decreased strength, increased fascial restrictions, impaired perceived functional ability, and pain.   ACTIVITY LIMITATIONS: bending, standing, squatting, sleeping, stairs, transfers, and locomotion level  PARTICIPATION LIMITATIONS: meal prep, cleaning, laundry, driving, shopping, and community activity  PERSONAL FACTORS: 1 comorbidity: depression  are also affecting patient's functional outcome.   REHAB POTENTIAL: Good  CLINICAL DECISION MAKING: Evolving/moderate complexity  EVALUATION COMPLEXITY: Moderate   GOALS: Goals reviewed with patient? No  SHORT TERM GOALS: Target date:  12/11/2023 patient will be independent with initial HEP  Baseline: Goal status: INITIAL  2.  Patient will improve SLS to 5 each leg to demonstrate improved functional balance Baseline:  Goal status: INITIAL  LONG TERM GOALS: Target date: 3 /03/2024  Patient will be independent in self management strategies to improve quality of life and functional outcomes.  Baseline:  Goal status: INITIAL  2.  Patient will report 50% improvement overall  Baseline:  Goal status: INITIAL  3.  Patient will improve SLS to 10 each to demonstrate improved functional balance Baseline: 3 each Goal status: INITIAL  4.  Patient will increase  bilateral leg MMT's to 4+ to 5/5 to allow navigation of steps without gait deviation or loss of balance  Baseline: see above Goal status: INITIAL  5.  Patient will improve her TUG to 10 or less to demonstrate improved functional mobility and decreased fall risk Baseline: 16.29 sec Goal status: INITIAL  6.  Patient will improve 5 times sit to stand score to 15 sec or less to demonstrate improved functional mobility and increased leg strength.    Baseline: 27.97 Goal status: INITIAL   PLAN:  PT FREQUENCY: 2x/week  PT DURATION: 4 weeks  PLANNED INTERVENTIONS: 97164- PT Re-evaluation, 97110-Therapeutic exercises, 97530- Therapeutic activity, 97112- Neuromuscular re-education, 97535- Self Care, [id number]- Manual therapy, [phone]- Gait training, [phone]- Orthotic Fit/training, [phone]- Canalith repositioning, [id number]- Aquatic Therapy, [phone]- Splinting, Patient/Family education, Balance training, Stair training, Taping, Dry Needling, Joint mobilization, Joint manipulation, Spinal manipulation, Spinal mobilization, Scar mobilization, and DME instructions.   PLAN FOR NEXT SESSION: Review HEP and goals;  test dgi; patient appears to have some cognitive impairments so has a hard time answering questions sometimes and can get frustrated/emotional with questioning.  Balance  training, strength training of legs; progress HEP; seeing OT for right shoulder.    8:47 AM, 11/27/23 Katniss Weedman Small [place] MPT [facility] [place] [phone] [phone]  [facility] Medicare Auth Request Information  Date of referral: 11/18/2023 Referring provider: Kennieth Leech, MD Referring diagnosis (ICD 10)? R26.81 Treatment diagnosis (ICD 10)? (if different than  referring diagnosis) R26.81, R26.2  Functional Tool Score: LEFS 38/80 47.5%  What was this (referring dx) caused by? Ongoing Issue and Unspecified  Nature of Condition: Chronic (continuous duration > 3 months)   Laterality: Rt  Current Functional Measure Score: LEFS 38/80 47.5%  Objective measurements identify impairments when they are compared to normal values, the uninvolved extremity, and prior level of function.  [x]  Yes  []  No  Objective assessment of functional ability: Moderate functional limitations   Briefly describe symptoms: pain right leg; right side; gait and balance impairments; leg weakness bilaterally  How did symptoms start: unknown  Average pain intensity:  Last 24 hours: 0-10  Past week: 0-10  How often does the pt experience symptoms? Frequently  How much have the symptoms interfered with usual daily activities? Moderately  How has condition changed since care began at this facility? No change  In general, how is the patients overall health? Good   BACK PAIN (STarT Back Screening Tool) No

## 2023-11-27 ENCOUNTER — Encounter (HOSPITAL_COMMUNITY): Payer: Self-pay | Admitting: Occupational Therapy

## 2023-11-27 ENCOUNTER — Ambulatory Visit (HOSPITAL_COMMUNITY): Payer: 59 | Admitting: Occupational Therapy

## 2023-11-27 ENCOUNTER — Ambulatory Visit (HOSPITAL_COMMUNITY): Payer: 59 | Attending: Family Medicine

## 2023-11-27 ENCOUNTER — Other Ambulatory Visit: Payer: Self-pay

## 2023-11-27 DIAGNOSIS — M25511 Pain in right shoulder: Secondary | ICD-10-CM | POA: Diagnosis not present

## 2023-11-27 DIAGNOSIS — R2681 Unsteadiness on feet: Secondary | ICD-10-CM | POA: Diagnosis not present

## 2023-11-27 DIAGNOSIS — M25611 Stiffness of right shoulder, not elsewhere classified: Secondary | ICD-10-CM | POA: Insufficient documentation

## 2023-11-27 DIAGNOSIS — R29898 Other symptoms and signs involving the musculoskeletal system: Secondary | ICD-10-CM | POA: Insufficient documentation

## 2023-11-27 DIAGNOSIS — R262 Difficulty in walking, not elsewhere classified: Secondary | ICD-10-CM | POA: Diagnosis not present

## 2023-11-27 NOTE — Therapy (Signed)
 OUTPATIENT OCCUPATIONAL THERAPY ORTHO EVALUATION  Patient Name: Sheryl [name] MRN: [id number] DOB:1955-12-31, 68 y.o., female Today's Date: 11/28/2023   END OF SESSION:  OT End of Session - 11/28/23 0843     Visit Number 1    Number of Visits 8    Date for OT Re-Evaluation 01/02/24    Authorization Type [employer] Dual Complete    OT Start Time 0852    OT Stop Time 0931    OT Time Calculation (min) 39 min    Activity Tolerance Patient tolerated treatment well    Behavior During Therapy Anxious;Lability             Past Medical History:  Diagnosis Date   Chronic back pain 10/22/2011   Chronic neck pain 10/22/2011   Depression    HLD (hyperlipidemia)    Hypertension    Past Surgical History:  Procedure Laterality Date   BREAST CYST EXCISION     right   COLONOSCOPY     per patient, many years ago at [facility]   COLONOSCOPY WITH PROPOFOL  N/A 11/16/2021   Procedure: COLONOSCOPY WITH PROPOFOL ;  Surgeon: Shaaron Lamar HERO, MD;  [facility];  Service: Endoscopy;  Laterality: N/A;  9:15am   POLYPECTOMY  11/16/2021   Procedure: POLYPECTOMY;  Surgeon: Shaaron Lamar HERO, MD;  Location: [facility];  Service: Endoscopy;;   TUBAL LIGATION     Patient Active Problem List   Diagnosis Date Noted   History of wheezing 11/15/2021   Dental abscess 11/15/2021   Colon cancer screening 09/03/2021   Pain, dental 10/12/2020   Hyperlipemia 09/07/2020   Borderline diabetes 09/06/2020   Bullous impetigo 07/26/2015   Mixed incontinence urge and stress 07/25/2015   Rotator cuff syndrome of left shoulder 12/19/2014   Difficulty walking 05/06/2012   Stiffness of vertebral column 05/06/2012   Decreased strength 05/06/2012   Class 3 obesity 04/16/2012   MDD (major depressive disorder) 04/16/2012   Essential hypertension, benign 04/16/2012   Back pain 04/16/2012   Cervical strain, acute 04/16/2012    PCP: Benjamine Aland, MD REFERRING PROVIDER: Benjamine Aland, MD  ONSET DATE: ~9  months  REFERRING DIAG: M79.601 (ICD-10-CM) - Right arm pain   THERAPY DIAG:  Right shoulder pain, unspecified chronicity  Shoulder stiffness, right  Other symptoms and signs involving the musculoskeletal system  Rationale for Evaluation and Treatment: Rehabilitation  SUBJECTIVE:   SUBJECTIVE STATEMENT: I've been trying to baby it and work with it but nothing is working. Pt accompanied by: self and family member  PERTINENT HISTORY: Pt reports that she has been having difficulty with controlling and mobilizing her RUE for a while and reports that pain has been significantly increasing. There has been no referral to an Orthopedic at this time. Pt also presenting with balance deficits and periods of confusion/difficulty answering questions, where she becomes frustrated and starts to tear up.   PRECAUTIONS: None  WEIGHT BEARING RESTRICTIONS: No  PAIN:  Are you having pain? Yes: NPRS scale: 8/10 Pain location: all over RUE Pain description: aching, sharp Aggravating factors: movement Relieving factors: unsure  FALLS: Has patient fallen in last 6 months? No  PLOF: Independent  PATIENT GOALS: To reduce pain  NEXT MD VISIT: 11/28/23  OBJECTIVE:   HAND DOMINANCE: Right  ADLs: Overall ADLs: Pain limits her tolerance to completing ADL's and IADL's efficiently. She uses her LUE more, as well as compensatory strategies.  FUNCTIONAL OUTCOME MEASURES: Quick Dash: 45.45  UPPER EXTREMITY ROM:  Assessed in seated, er/IR adducted  Active ROM Right eval  Shoulder flexion 141  Shoulder abduction 139  Shoulder internal rotation 90  Shoulder external rotation 9  (Blank rows = not tested)    UPPER EXTREMITY MMT:     Assessed in seated, er/IR adducted  MMT Right eval  Shoulder flexion 4-/5  Shoulder abduction 4/5  Shoulder internal rotation 4+/5  Shoulder external rotation 4/5 (w/ pain)  (Blank rows = not tested)  HAND FUNCTION: Grip strength: Right: 29  lbs; Left: 29 lbs  SENSATION: WFL  EDEMA: Pt reports sometimes the upper arm feels swollen.  COGNITION: Overall cognitive status:  Pt emotional with questions, requiring increased time to answer and at times appears to have difficulty understanding the questions.  Areas of impairment: Awareness: Deficits Poor awareness of all deficits Behavior: Poor frustration tolerance and Lability  OBSERVATIONS: Moderate fascial restrictions along deltoid, trapezius, and biceps.    TODAY'S TREATMENT:                                                                                                                              DATE:   11/27/23 -evaluation -Measurements -Pendulums: 2x30 -Table Slides: flexion, abduction, er, x10 each    PATIENT EDUCATION: Education details: Pendulums and Table Slides Person educated: Patient and Child(ren) Education method: Explanation, Demonstration, and Handouts Education comprehension: verbalized understanding and returned demonstration  HOME EXERCISE PROGRAM: 2/6: Pendulums and Table Slides  GOALS: Goals reviewed with patient? Yes   SHORT TERM GOALS: Target date: 01/02/24  Pt will be provided with and educated on HEP to improve mobility in RUE required for use during ADL completion.   Goal status: INITIAL  Pt will decrease pain in RUE to 3/10 or less to improve ability to sleep for 2+ consecutive hours without waking due to pain.   Goal status: INITIAL  2.  Pt will decrease RUE fascial restrictions to min amounts or less to improve mobility required for functional reaching tasks.   Goal status: INITIAL  3.  Pt will increase RUE A/ROM by 20 degrees to improve ability to use RUE when reaching overhead or behind back during dressing and bathing tasks.   Goal status: INITIAL  4.  Pt will increase RUE strength to 4+/5 or greater to improve ability to use RUE when lifting or carrying items during meal preparation/housework/yardwork tasks.   Goal  status: INITIAL  5.  Pt will return to highest level of function using RUE as dominant during functional task completion.   Goal status: INITIAL   ASSESSMENT:  CLINICAL IMPRESSION: Patient is a 68 y.o. female who was seen today for occupational therapy evaluation for RUE pain and weakness. Pt presents with increased pain and fascial restrictions, decreased ROM, strength, and functional use of the RUE.   PERFORMANCE DEFICITS: in functional skills including in functional skills including ADLs, IADLs, coordination, tone, ROM, strength, pain, fascial restrictions, muscle spasms, and UE functional use, cognitive skills including emotional,  memory, and safety awareness, and psychosocial skills including coping strategies, interpersonal interactions, and routines and behaviors.   IMPAIRMENTS: are limiting patient from ADLs, IADLs, rest and sleep, leisure, and social participation.   COMORBIDITIES: may have co-morbidities  that affects occupational performance. Patient will benefit from skilled OT to address above impairments and improve overall function.  MODIFICATION OR ASSISTANCE TO COMPLETE EVALUATION: Min-Moderate modification of tasks or assist with assess necessary to complete an evaluation.  OT OCCUPATIONAL PROFILE AND HISTORY: Detailed assessment: Review of records and additional review of physical, cognitive, psychosocial history related to current functional performance.  CLINICAL DECISION MAKING: Moderate - several treatment options, min-mod task modification necessary  REHAB POTENTIAL: Good  EVALUATION COMPLEXITY: Moderate      PLAN:  OT FREQUENCY: 2x/week  OT DURATION: 4 weeks  PLANNED INTERVENTIONS: 97168 OT Re-evaluation, 97535 self care/ADL training, 02889 therapeutic exercise, 97530 therapeutic activity, 97112 neuromuscular re-education, 97140 manual therapy, 97035 ultrasound, 97010 moist heat, 97032 electrical stimulation (manual), passive range of motion, functional  mobility training, energy conservation, coping strategies training, patient/family education, and DME and/or AE instructions  RECOMMENDED OTHER SERVICES: Orthopedic MD referral  CONSULTED AND AGREED WITH PLAN OF CARE: Patient and family member/caregiver  PLAN FOR NEXT SESSION: Manual Therapy, AA/ROM, A/ROM, Proximal shoulder exercises   Valentin Nightingale, OTR/L [facility] [phone] Soniyah Mcglory Jillyn Nightingale, OT 11/28/2023, 8:48 AM

## 2023-11-27 NOTE — Patient Instructions (Signed)

## 2023-11-28 DIAGNOSIS — I10 Essential (primary) hypertension: Secondary | ICD-10-CM | POA: Diagnosis not present

## 2023-11-28 DIAGNOSIS — E782 Mixed hyperlipidemia: Secondary | ICD-10-CM | POA: Diagnosis not present

## 2023-11-28 DIAGNOSIS — E1169 Type 2 diabetes mellitus with other specified complication: Secondary | ICD-10-CM | POA: Diagnosis not present

## 2023-12-01 ENCOUNTER — Ambulatory Visit (HOSPITAL_COMMUNITY): Payer: 59 | Admitting: Occupational Therapy

## 2023-12-01 ENCOUNTER — Encounter (HOSPITAL_COMMUNITY): Payer: Self-pay | Admitting: Occupational Therapy

## 2023-12-01 DIAGNOSIS — M25511 Pain in right shoulder: Secondary | ICD-10-CM | POA: Diagnosis not present

## 2023-12-01 DIAGNOSIS — R29898 Other symptoms and signs involving the musculoskeletal system: Secondary | ICD-10-CM | POA: Diagnosis not present

## 2023-12-01 DIAGNOSIS — R262 Difficulty in walking, not elsewhere classified: Secondary | ICD-10-CM | POA: Diagnosis not present

## 2023-12-01 DIAGNOSIS — R2681 Unsteadiness on feet: Secondary | ICD-10-CM | POA: Diagnosis not present

## 2023-12-01 DIAGNOSIS — M25611 Stiffness of right shoulder, not elsewhere classified: Secondary | ICD-10-CM

## 2023-12-01 NOTE — Therapy (Signed)
 OUTPATIENT OCCUPATIONAL THERAPY ORTHO TREATMENT NOTE  Patient Name: Sheryl [name] MRN: [id number] DOB:19-Aug-1956, 68 y.o., female Today's Date: 12/01/2023   END OF SESSION:  OT End of Session - 12/01/23 1011     Visit Number 2    Number of Visits 8    Date for OT Re-Evaluation 01/02/24    Authorization Type [employer] Dual Complete    OT Start Time [phone]    OT Stop Time 1004    OT Time Calculation (min) 42 min    Activity Tolerance Patient tolerated treatment well    Behavior During Therapy Anxious;Lability             Past Medical History:  Diagnosis Date   Chronic back pain 10/22/2011   Chronic neck pain 10/22/2011   Depression    HLD (hyperlipidemia)    Hypertension    Past Surgical History:  Procedure Laterality Date   BREAST CYST EXCISION     right   COLONOSCOPY     per patient, many years ago at [facility]   COLONOSCOPY WITH PROPOFOL  N/A 11/16/2021   Procedure: COLONOSCOPY WITH PROPOFOL ;  Surgeon: Suzette Espy, MD;  [facility];  Service: Endoscopy;  Laterality: N/A;  9:15am   POLYPECTOMY  11/16/2021   Procedure: POLYPECTOMY;  Surgeon: Suzette Espy, MD;  [facility];  Service: Endoscopy;;   TUBAL LIGATION     Patient Active Problem List   Diagnosis Date Noted   History of wheezing 11/15/2021   Dental abscess 11/15/2021   Colon cancer screening 09/03/2021   Pain, dental 10/12/2020   Hyperlipemia 09/07/2020   Borderline diabetes 09/06/2020   Bullous impetigo 07/26/2015   Mixed incontinence urge and stress 07/25/2015   Rotator cuff syndrome of left shoulder 12/19/2014   Difficulty walking 05/06/2012   Stiffness of vertebral column 05/06/2012   Decreased strength 05/06/2012   Class 3 obesity 04/16/2012   MDD (major depressive disorder) 04/16/2012   Essential hypertension, benign 04/16/2012   Back pain 04/16/2012   Cervical strain, acute 04/16/2012    PCP: Jonathon Neighbors, MD REFERRING PROVIDER: Jonathon Neighbors, MD  ONSET DATE:  ~9 months  REFERRING DIAG: M79.601 (ICD-10-CM) - Right arm pain   THERAPY DIAG:  Right shoulder pain, unspecified chronicity  Shoulder stiffness, right  Other symptoms and signs involving the musculoskeletal system  Rationale for Evaluation and Treatment: Rehabilitation  SUBJECTIVE:   SUBJECTIVE STATEMENT: "I've been trying to baby it and work with it but nothing is working." Pt accompanied by: self and family member  PERTINENT HISTORY: Pt reports that she has been having difficulty with controlling and mobilizing her RUE "for a while" and reports that pain has been significantly increasing. There has been no referral to an Orthopedic at this time. Pt also presenting with balance deficits and periods of confusion/difficulty answering questions, where she becomes frustrated and starts to tear up.   PRECAUTIONS: None  WEIGHT BEARING RESTRICTIONS: No  PAIN:  Are you having pain? Yes: NPRS scale: 8/10 Pain location: all over RUE Pain description: aching, sharp Aggravating factors: movement Relieving factors: unsure  FALLS: Has patient fallen in last 6 months? No  PLOF: Independent  PATIENT GOALS: "To reduce pain"  NEXT MD VISIT: 11/28/23  OBJECTIVE:   HAND DOMINANCE: Right  ADLs: Overall ADLs: Pain limits her tolerance to completing ADL's and IADL's efficiently. She uses her LUE more, as well as compensatory strategies.  FUNCTIONAL OUTCOME MEASURES: Quick Dash: 45.45  UPPER EXTREMITY ROM:  Assessed in seated, er/IR adducted  Active ROM Right eval  Shoulder flexion 141  Shoulder abduction 139  Shoulder internal rotation 90  Shoulder external rotation 9  (Blank rows = not tested)    UPPER EXTREMITY MMT:     Assessed in seated, er/IR adducted  MMT Right eval  Shoulder flexion 4-/5  Shoulder abduction 4/5  Shoulder internal rotation 4+/5  Shoulder external rotation 4/5 (w/ pain)  (Blank rows = not tested)  HAND FUNCTION: Grip strength: Right: 29  lbs; Left: 29 lbs  SENSATION: WFL  EDEMA: Pt reports sometimes the upper arm feels swollen.  COGNITION: Overall cognitive status:  Pt emotional with questions, requiring increased time to answer and at times appears to have difficulty understanding the questions.  Areas of impairment: Awareness: Deficits Poor awareness of all deficits Behavior: Poor frustration tolerance and Lability  OBSERVATIONS: Moderate fascial restrictions along deltoid, trapezius, and biceps.    TODAY'S TREATMENT:                                                                                                                              DATE:   12/01/23 -Manual therapy: Myofascial release and trigger point applied to biceps, trapezius, deltoid, and scapular region in order to reduce pain and fascial restriction, as well as improve ROM.  -P/ROM: supine, flexion, abduction, er/IR, x6 -AA/ROM: supine, flexion, abduction, Protraction, horizontal abduction, er/IR, x10 -isometrics: flexion, extension, abduction, IR, 4x10" -Scapular ROM: elevation/depression, retraction/protraction, x10  11/27/23 -evaluation -Measurements -Pendulums: 2x30" -Table Slides: flexion, abduction, er, x10 each    PATIENT EDUCATION: Education details: AA/ROM, Isometrics Person educated: Patient and Child(ren) Education method: Explanation, Demonstration, and Handouts Education comprehension: verbalized understanding and returned demonstration  HOME EXERCISE PROGRAM: 2/6: Pendulums and Table Slides 2/10: AA/ROM, Isometrics  GOALS: Goals reviewed with patient? Yes   SHORT TERM GOALS: Target date: 01/02/24  Pt will be provided with and educated on HEP to improve mobility in RUE required for use during ADL completion.   Goal status: IN PROGRESS  Pt will decrease pain in RUE to 3/10 or less to improve ability to sleep for 2+ consecutive hours without waking due to pain.   Goal status: IN PROGRESS  2.  Pt will decrease RUE  fascial restrictions to min amounts or less to improve mobility required for functional reaching tasks.   Goal status: IN PROGRESS  3.  Pt will increase RUE A/ROM by 20 degrees to improve ability to use RUE when reaching overhead or behind back during dressing and bathing tasks.   Goal status: IN PROGRESS  4.  Pt will increase RUE strength to 4+/5 or greater to improve ability to use RUE when lifting or carrying items during meal preparation/housework/yardwork tasks.   Goal status: IN PROGRESS  5.  Pt will return to highest level of function using RUE as dominant during functional task completion.   Goal status: IN PROGRESS   ASSESSMENT:  CLINICAL IMPRESSION: Patient presenting to  first OT session, with continued pain during mobility. She is continuing to have lability and frustration with task explanations and corrections, however was able to push through and complete each task asked of her. OT added AA/ROM and Isometrics this session for good mobility and proximal strength. Verbal and tactile cuing provided throughout session for positioning and technique.    PERFORMANCE DEFICITS: in functional skills including in functional skills including ADLs, IADLs, coordination, tone, ROM, strength, pain, fascial restrictions, muscle spasms, and UE functional use, cognitive skills including emotional, memory, and safety awareness, and psychosocial skills including coping strategies, interpersonal interactions, and routines and behaviors.    PLAN:  OT FREQUENCY: 2x/week  OT DURATION: 4 weeks  PLANNED INTERVENTIONS: 97168 OT Re-evaluation, 97535 self care/ADL training, 40981 therapeutic exercise, 97530 therapeutic activity, 97112 neuromuscular re-education, 97140 manual therapy, 97035 ultrasound, 97010 moist heat, 97032 electrical stimulation (manual), passive range of motion, functional mobility training, energy conservation, coping strategies training, patient/family education, and DME and/or  AE instructions  RECOMMENDED OTHER SERVICES: Orthopedic MD referral  CONSULTED AND AGREED WITH PLAN OF CARE: Patient and family member/caregiver  PLAN FOR NEXT SESSION: Manual Therapy, AA/ROM, A/ROM, Proximal shoulder exercises   Leticia Raven, OTR/L [facility] [phone] Michaeline Eckersley Judyann Number, OT 12/01/2023, 10:12 AM

## 2023-12-01 NOTE — Patient Instructions (Signed)
 Perform each exercise ____10-15____ reps. 2-3x days.   1) Protraction   Start by holding a wand or cane at chest height.  Next, slowly push the wand outwards in front of your body so that your elbows become fully straightened. Then, return to the original position.     2) Shoulder FLEXION   In the standing position, hold a wand/cane with both arms, palms down on both sides. Raise up the wand/cane allowing your unaffected arm to perform most of the effort. Your affected arm should be partially relaxed.      3) Internal/External ROTATION   In the standing position, hold a wand/cane with both hands keeping your elbows bent. Move your arms and wand/cane to one side.  Your affected arm should be partially relaxed while your unaffected arm performs most of the effort.       4) Shoulder ABDUCTION   While holding a wand/cane palm face up on the injured side and palm face down on the uninjured side, slowly raise up your injured arm to the side.        5) Horizontal Abduction/Adduction      Straight arms holding cane at shoulder height, bring cane to right, center, left. Repeat starting to left.   Copyright  [employer]. All rights reserved.       Complete the following 2 a day. Hold for 10-15 seconds. Complete 4-6 sets for each.   1) SHOULDER - ISOMETRIC FLEXION  Gently push your fist forward into a wall with your elbow bent.    2) SHOULDER - ISOMETRIC EXTENSION  Gently push your a bent elbow back into a wall.    3) SHOULDER - ISOMETRIC INTERNAL ROTATION   Gently press your hand into a wall using the palm side of your hand.  Maintain a bent elbow the entire time.        4) SHOULDER - ISOMETRIC ADDUCTION  Gently push your elbow into the side of your body.   5) SHOULDER - ISOMETRIC ABDUCTION  Gently push your elbow out to the side into a wall with your elbow bent.

## 2023-12-05 ENCOUNTER — Ambulatory Visit (HOSPITAL_COMMUNITY): Payer: 59 | Admitting: Occupational Therapy

## 2023-12-05 ENCOUNTER — Encounter (HOSPITAL_COMMUNITY): Payer: Self-pay | Admitting: Occupational Therapy

## 2023-12-05 DIAGNOSIS — M25511 Pain in right shoulder: Secondary | ICD-10-CM | POA: Diagnosis not present

## 2023-12-05 DIAGNOSIS — M25611 Stiffness of right shoulder, not elsewhere classified: Secondary | ICD-10-CM | POA: Diagnosis not present

## 2023-12-05 DIAGNOSIS — R29898 Other symptoms and signs involving the musculoskeletal system: Secondary | ICD-10-CM | POA: Diagnosis not present

## 2023-12-05 DIAGNOSIS — R2681 Unsteadiness on feet: Secondary | ICD-10-CM | POA: Diagnosis not present

## 2023-12-05 DIAGNOSIS — R262 Difficulty in walking, not elsewhere classified: Secondary | ICD-10-CM | POA: Diagnosis not present

## 2023-12-05 NOTE — Therapy (Signed)
OUTPATIENT OCCUPATIONAL THERAPY ORTHO TREATMENT NOTE  Patient Name: Sheryl [name] MRN: [id number] DOB:[birth year], 68 y.o., female Today's Date: 12/05/2023   END OF SESSION:  OT End of Session - 12/05/23 0930     Visit Number 3    Number of Visits 8    Date for OT Re-Evaluation 01/02/24    Authorization Type [employer] Dual Complete    OT Start Time 0850    OT Stop Time 0930    OT Time Calculation (min) 40 min    Activity Tolerance Patient tolerated treatment well    Behavior During Therapy [facility] for tasks assessed/performed;Anxious              Past Medical History:  Diagnosis Date   Chronic back pain 10/22/2011   Chronic neck pain 10/22/2011   Depression    HLD (hyperlipidemia)    Hypertension    Past Surgical History:  Procedure Laterality Date   BREAST CYST EXCISION     right   COLONOSCOPY     per patient, many years ago at [facility]   COLONOSCOPY WITH PROPOFOL N/A 11/16/2021   Procedure: COLONOSCOPY WITH PROPOFOL;  Surgeon: Corbin Ade, MD;  [facility];  Service: Endoscopy;  Laterality: N/A;  9:15am   POLYPECTOMY  11/16/2021   Procedure: POLYPECTOMY;  Surgeon: Corbin Ade, MD;  Location: [facility];  Service: Endoscopy;;   TUBAL LIGATION     Patient Active Problem List   Diagnosis Date Noted   History of wheezing 11/15/2021   Dental abscess 11/15/2021   Colon cancer screening 09/03/2021   Pain, dental 10/12/2020   Hyperlipemia 09/07/2020   Borderline diabetes 09/06/2020   Bullous impetigo 07/26/2015   Mixed incontinence urge and stress 07/25/2015   Rotator cuff syndrome of left shoulder 12/19/2014   Difficulty walking 05/06/2012   Stiffness of vertebral column 05/06/2012   Decreased strength 05/06/2012   Class 3 obesity 04/16/2012   MDD (major depressive disorder) 04/16/2012   Essential hypertension, benign 04/16/2012   Back pain 04/16/2012   Cervical strain, acute 04/16/2012    PCP: Renaye Rakers, MD REFERRING PROVIDER:  Renaye Rakers, MD  ONSET DATE: ~9 months  REFERRING DIAG: M79.601 (ICD-10-CM) - Right arm pain   THERAPY DIAG:  Right shoulder pain, unspecified chronicity  Shoulder stiffness, right  Other symptoms and signs involving the musculoskeletal system  Rationale for Evaluation and Treatment: Rehabilitation  SUBJECTIVE:   SUBJECTIVE STATEMENT: "I got here early so I wouldn't be as confused." Pt accompanied by: self and family member  PERTINENT HISTORY: Pt reports that she has been having difficulty with controlling and mobilizing her RUE "for a while" and reports that pain has been significantly increasing. There has been no referral to an Orthopedic at this time. Pt also presenting with balance deficits and periods of confusion/difficulty answering questions, where she becomes frustrated and starts to tear up.   PRECAUTIONS: None  WEIGHT BEARING RESTRICTIONS: No  PAIN:  Are you having pain? Yes: NPRS scale: 8/10 Pain location: all over RUE Pain description: aching, sharp Aggravating factors: movement Relieving factors: unsure  FALLS: Has patient fallen in last 6 months? No  PLOF: Independent  PATIENT GOALS: "To reduce pain"  NEXT MD VISIT: 11/28/23  OBJECTIVE:   HAND DOMINANCE: Right  ADLs: Overall ADLs: Pain limits her tolerance to completing ADL's and IADL's efficiently. She uses her LUE more, as well as compensatory strategies.  FUNCTIONAL OUTCOME MEASURES: Quick Dash: 45.45  UPPER EXTREMITY ROM:  Assessed in seated, er/IR adducted  Active ROM Right eval  Shoulder flexion 141  Shoulder abduction 139  Shoulder internal rotation 90  Shoulder external rotation 9  (Blank rows = not tested)    UPPER EXTREMITY MMT:     Assessed in seated, er/IR adducted  MMT Right eval  Shoulder flexion 4-/5  Shoulder abduction 4/5  Shoulder internal rotation 4+/5  Shoulder external rotation 4/5 (w/ pain)  (Blank rows = not tested)  HAND FUNCTION: Grip strength:  Right: 29 lbs; Left: 29 lbs  SENSATION: WFL  EDEMA: Pt reports sometimes the upper arm feels swollen.  COGNITION: Overall cognitive status:  Pt emotional with questions, requiring increased time to answer and at times appears to have difficulty understanding the questions.  Areas of impairment: Awareness: Deficits Poor awareness of all deficits Behavior: Poor frustration tolerance and Lability  OBSERVATIONS: Moderate fascial restrictions along deltoid, trapezius, and biceps.    TODAY'S TREATMENT:                                                                                                                              DATE:   12/06/23 -Manual therapy: Myofascial release and trigger point applied to biceps, trapezius, deltoid, and scapular region in order to reduce pain and fascial restriction, as well as improve ROM.  -P/ROM: supine, flexion, abduction, er/IR, x6 -AA/ROM: supine, flexion, abduction, Protraction, horizontal abduction, er/IR, x10 -Scapular ROM: elevation/depression, retraction/protraction, x10 -Proximal strengthening: paddles, criss cross, circles and reverse 30 seconds 2 sets  -UBE bike: 2 mins forwards 2 mins backwards 3 pace level 1   12/01/23 -Manual therapy: Myofascial release and trigger point applied to biceps, trapezius, deltoid, and scapular region in order to reduce pain and fascial restriction, as well as improve ROM.  -P/ROM: supine, flexion, abduction, er/IR, x6 -AA/ROM: supine, flexion, abduction, Protraction, horizontal abduction, er/IR, x10 -isometrics: flexion, extension, abduction, IR, 4x10" -Scapular ROM: elevation/depression, retraction/protraction, x10  11/27/23 -evaluation -Measurements -Pendulums: 2x30" -Table Slides: flexion, abduction, er, x10 each   PATIENT EDUCATION: Education details: A/ROM  Person educated: Patient and Child(ren) Education method: Programmer, multimedia, Demonstration, and Handouts Education comprehension: verbalized  understanding and returned demonstration  HOME EXERCISE PROGRAM: 2/6: Pendulums and Table Slides 2/10: AA/ROM, Isometrics 2/15: A/ROM   GOALS: Goals reviewed with patient? Yes   SHORT TERM GOALS: Target date: 01/02/24  Pt will be provided with and educated on HEP to improve mobility in RUE required for use during ADL completion.   Goal status: IN PROGRESS  Pt will decrease pain in RUE to 3/10 or less to improve ability to sleep for 2+ consecutive hours without waking due to pain.   Goal status: IN PROGRESS  2.  Pt will decrease RUE fascial restrictions to min amounts or less to improve mobility required for functional reaching tasks.   Goal status: IN PROGRESS  3.  Pt will increase RUE A/ROM by 20 degrees to improve ability to use RUE when reaching overhead or behind  back during dressing and bathing tasks.   Goal status: IN PROGRESS  4.  Pt will increase RUE strength to 4+/5 or greater to improve ability to use RUE when lifting or carrying items during meal preparation/housework/yardwork tasks.   Goal status: IN PROGRESS  5.  Pt will return to highest level of function using RUE as dominant during functional task completion.   Goal status: IN PROGRESS   ASSESSMENT:  CLINICAL IMPRESSION: Began session with gentle manual techniques to decrease fascial restrictions and increase ROM- pt very tender and reports pain with this. Completing AA/ROM in supine- pt reports pain coming down from shoulder flexion but demonstrates good movement. Added in proximal shoulder strengthening- pt tearful with these as she stated she became confused. Ended session on arm bike.   PERFORMANCE DEFICITS: in functional skills including in functional skills including ADLs, IADLs, coordination, tone, ROM, strength, pain, fascial restrictions, muscle spasms, and UE functional use, cognitive skills including emotional, memory, and safety awareness, and psychosocial skills including coping strategies,  interpersonal interactions, and routines and behaviors.    PLAN:  OT FREQUENCY: 2x/week  OT DURATION: 4 weeks  PLANNED INTERVENTIONS: 97168 OT Re-evaluation, 97535 self care/ADL training, 09811 therapeutic exercise, 97530 therapeutic activity, 97112 neuromuscular re-education, 97140 manual therapy, 97035 ultrasound, 97010 moist heat, 97032 electrical stimulation (manual), passive range of motion, functional mobility training, energy conservation, coping strategies training, patient/family education, and DME and/or AE instructions  RECOMMENDED OTHER SERVICES: Orthopedic MD referral  CONSULTED AND AGREED WITH PLAN OF CARE: Patient and family member/caregiver  PLAN FOR NEXT SESSION: Manual Therapy, AA/ROM, A/ROM, Proximal shoulder exercises    [facility] [phone] Bevelyn Ngo, OTR/L 12/05/2023, 9:31 AM

## 2023-12-05 NOTE — Patient Instructions (Signed)
1) ROM: Abduction (Standing)   Bring arms straight out from sides and raise as high as possible without pain. Repeat ____ times per set. Do ____ sets per session. Do ____ sessions per day.  http://orth.exer.us/910   Copyright  VHI. All rights reserved.   2) Extension (Active) ROM: Extension (Standing)   Bring arms straight back as far as possible without pain. Repeat ____ times per set. Do ____ sets per session. Do ____ sessions per day.  http://orth.exer.us/916   Copyright  VHI. All rights reserved.   3) ROM: External / Internal Rotation - in Abduction (Standing)   With upper arms parallel to floor and elbows bent at right angles, gently rotate arms up then down as far as possible without pain. Repeat ____ times per set. Do ____ sets per session. Do ____ sessions per day.  http://orth.exer.us/912   Copyright  VHI. All rights reserved.    4) Flexors Stretch (Active)   Stand, arms straight at sides. Bring arms straight forward and upward as high as possible without pain. Hold ___ seconds. Repeat ___ times per session. Do ___ sessions per day.  Copyright  VHI. All rights reserved.   5) Scapular Retraction (Standing)   With arms at sides, pinch shoulder blades together. Repeat ____ times per set. Do ____ sets per session. Do ____ sessions per day.  http://orth.exer.us/944   Copyright  VHI. All rights reserved.

## 2023-12-08 ENCOUNTER — Ambulatory Visit
Admission: EM | Admit: 2023-12-08 | Discharge: 2023-12-08 | Disposition: A | Discharge status: Home / Self Care | Payer: 59 | Attending: Family Medicine | Admitting: Family Medicine

## 2023-12-08 ENCOUNTER — Encounter (HOSPITAL_COMMUNITY): Payer: 59 | Admitting: Occupational Therapy

## 2023-12-08 DIAGNOSIS — R030 Elevated blood-pressure reading, without diagnosis of hypertension: Secondary | ICD-10-CM | POA: Diagnosis not present

## 2023-12-08 DIAGNOSIS — R0602 Shortness of breath: Secondary | ICD-10-CM | POA: Diagnosis not present

## 2023-12-08 DIAGNOSIS — J069 Acute upper respiratory infection, unspecified: Secondary | ICD-10-CM | POA: Diagnosis not present

## 2023-12-08 DIAGNOSIS — R062 Wheezing: Secondary | ICD-10-CM

## 2023-12-08 LAB — POC COVID19/FLU A&B COMBO
Covid Antigen, POC: NEGATIVE
Influenza A Antigen, POC: NEGATIVE
Influenza B Antigen, POC: NEGATIVE

## 2023-12-08 MED ORDER — PROMETHAZINE-DM 6.25-15 MG/5ML PO SYRP: 5.0000 mL | ORAL_SOLUTION | Freq: Four times a day (QID) | ORAL | 0 refills | Status: AC | PRN

## 2023-12-08 MED ORDER — ALBUTEROL SULFATE HFA 108 (90 BASE) MCG/ACT IN AERS: 2.0000 | INHALATION_SPRAY | RESPIRATORY_TRACT | 0 refills | Status: AC | PRN

## 2023-12-08 MED ORDER — DEXAMETHASONE SODIUM PHOSPHATE 10 MG/ML IJ SOLN
10.0000 mg | Freq: Once | INTRAMUSCULAR | Status: AC
  Administered 2023-12-08: 10 mg via INTRAMUSCULAR

## 2023-12-08 NOTE — ED Triage Notes (Signed)
Pt reports chest pain, burning and cough, chills and body aches started today

## 2023-12-09 ENCOUNTER — Encounter (HOSPITAL_COMMUNITY): Payer: 59 | Admitting: Physical Therapy

## 2023-12-09 NOTE — ED Provider Notes (Signed)
[facility]    CSN: [id number] Arrival date & time: 12/08/23  1755      History   Chief Complaint No chief complaint on file.   HPI Sheryl [name] is a 68 y.o. female.   Patient presenting today with 1 day history of chest tightness, chills, body aches, cough, congestion, wheezing.  Denies abdominal pain, vomiting, diarrhea, rashes, sore throat.  So far not tried anything over-the-counter for symptoms.  Denies any diagnosed chronic pulmonary disease but does note that she tends to get bronchitis when she is sick.    Past Medical History:  Diagnosis Date   Chronic back pain 10/22/2011   Chronic neck pain 10/22/2011   Depression    HLD (hyperlipidemia)    Hypertension     Patient Active Problem List   Diagnosis Date Noted   History of wheezing 11/15/2021   Dental abscess 11/15/2021   Colon cancer screening 09/03/2021   Pain, dental 10/12/2020   Hyperlipemia 09/07/2020   Borderline diabetes 09/06/2020   Bullous impetigo 07/26/2015   Mixed incontinence urge and stress 07/25/2015   Rotator cuff syndrome of left shoulder 12/19/2014   Difficulty walking 05/06/2012   Stiffness of vertebral column 05/06/2012   Decreased strength 05/06/2012   Class 3 obesity 04/16/2012   MDD (major depressive disorder) 04/16/2012   Essential hypertension, benign 04/16/2012   Back pain 04/16/2012   Cervical strain, acute 04/16/2012    Past Surgical History:  Procedure Laterality Date   BREAST CYST EXCISION     right   COLONOSCOPY     per patient, many years ago at [facility]   COLONOSCOPY WITH PROPOFOL N/A 11/16/2021   Procedure: COLONOSCOPY WITH PROPOFOL;  Surgeon: Corbin Ade, MD;  [facility];  Service: Endoscopy;  Laterality: N/A;  9:15am   POLYPECTOMY  11/16/2021   Procedure: POLYPECTOMY;  Surgeon: Corbin Ade, MD;  [facility];  Service: Endoscopy;;   TUBAL LIGATION      OB History   No obstetric history on file.      Home  Medications    Prior to Admission medications   Medication Sig Start Date End Date Taking? Authorizing Provider  promethazine-dextromethorphan (PROMETHAZINE-DM) 6.25-15 MG/5ML syrup Take 5 mLs by mouth 4 (four) times daily as needed. 12/08/23  Yes Particia Nearing, PA-C  albuterol (VENTOLIN HFA) 108 (90 Base) MCG/ACT inhaler Inhale 2 puffs into the lungs every 4 (four) hours as needed for wheezing or shortness of breath. 12/08/23   Particia Nearing, PA-C  Aspirin-Salicylamide-Caffeine [facility] HEADACHE POWDER PO) Take 2 packets by mouth daily as needed (headaches).    [provider]  atorvastatin (LIPITOR) 10 MG tablet Take 1 tablet (10 mg total) by mouth at bedtime. 04/27/21   Valentino Nose, NP  cetirizine (ZYRTEC ALLERGY) 10 MG tablet Take 1 tablet (10 mg total) by mouth daily. 01/21/23   Particia Nearing, PA-C  clotrimazole-betamethasone (LOTRISONE) cream Apply 1 application topically 2 (two) times daily. Patient taking differently: Apply 1 application  topically 2 (two) times daily as needed (skin irritation/rash). 04/27/21   Valentino Nose, NP  erythromycin ophthalmic ointment Place a 1/2 inch ribbon of ointment into the left lower eyelid BID prn. 06/15/22   Particia Nearing, PA-C  escitalopram (LEXAPRO) 10 MG tablet TAKE 1 TABLET BY MOUTH DAILY AS DIRECTED FOR MOO. *PT NEEDS NEW PCP* 02/11/22   Donita Brooks, MD  fluticasone (FLONASE) 50 MCG/ACT nasal spray Place 2 sprays into  both nostrils daily. 03/05/23   Leath-Warren, Sadie Haber, NP  guaiFENesin (MUCINEX) 600 MG 12 hr tablet Take 1 tablet (600 mg total) by mouth 2 (two) times daily as needed. 01/21/23   Particia Nearing, PA-C  hydrochlorothiazide (HYDRODIURIL) 25 MG tablet TAKE 1 TABLET BY MOUTH IN THE MORNING FOR BLOOD PRESSURE 11/12/21   Cathlean Marseilles A, NP  lidocaine (XYLOCAINE) 2 % solution Use as directed 5 mLs in the mouth or throat as needed for mouth pain. Gargle and spit 5mL every 6 hours as  needed for throat pain or discomfort. 03/05/23   Leath-Warren, Sadie Haber, NP  mirabegron ER (MYRBETRIQ) 25 MG TB24 tablet TAKE 1 TABLET BY MOUTH AT BEDTIME FOR YOUR BLADDER 09/06/20   Salley Scarlet, MD  promethazine-dextromethorphan (PROMETHAZINE-DM) 6.25-15 MG/5ML syrup Take 5 mLs by mouth 4 (four) times daily as needed for cough. 03/05/23   Leath-Warren, Sadie Haber, NP    Family History Family History  Problem Relation Age of Onset   Hypertension Mother    Depression Mother    Hypertension Sister    Hypertension Sister    Colon cancer Neg Hx     Social History Social History   Tobacco Use   Smoking status: Never   Smokeless tobacco: Never  Vaping Use   Vaping status: Never Used  Substance Use Topics   Alcohol use: No    Alcohol/week: 0.0 standard drinks of alcohol   Drug use: No     Allergies   Patient has no known allergies.   Review of Systems Review of Systems Per HPI  Physical Exam Triage Vital Signs ED Triage Vitals  Encounter Vitals Group     BP 12/08/23 1806 (!) 160/97     Systolic BP Percentile --      Diastolic BP Percentile --      Pulse --      Resp 12/08/23 1806 20     Temp 12/08/23 1806 98.8 F (37.1 C)     Temp Source 12/08/23 1806 Oral     SpO2 12/08/23 1806 95 %     Weight --      Height --      Head Circumference --      Peak Flow --      Pain Score 12/08/23 1808 0     Pain Loc --      Pain Education --      Exclude from Growth Chart --    No data found.  Updated Vital Signs BP (!) 160/97 (BP Location: Right Arm)   Temp 98.8 F (37.1 C) (Oral)   Resp 20   LMP 12/20/2011   SpO2 95%   Visual Acuity Right Eye Distance:   Left Eye Distance:   Bilateral Distance:    Right Eye Near:   Left Eye Near:    Bilateral Near:     Physical Exam Vitals and nursing note reviewed.  Constitutional:      Appearance: Normal appearance.  HENT:     Head: Atraumatic.     Right Ear: Tympanic membrane and external ear normal.     Left  Ear: Tympanic membrane and external ear normal.     Nose: Rhinorrhea present.     Mouth/Throat:     Mouth: Mucous membranes are moist.     Pharynx: Posterior oropharyngeal erythema present.  Eyes:     Extraocular Movements: Extraocular movements intact.     Conjunctiva/sclera: Conjunctivae normal.  Cardiovascular:     Rate and Rhythm:  Normal rate and regular rhythm.     Heart sounds: Normal heart sounds.  Pulmonary:     Effort: Pulmonary effort is normal.     Breath sounds: Wheezing present. No rales.  Musculoskeletal:        General: Normal range of motion.     Cervical back: Normal range of motion and neck supple.  Skin:    General: Skin is warm and dry.  Neurological:     Mental Status: She is alert and oriented to person, place, and time.  Psychiatric:        Mood and Affect: Mood normal.        Thought Content: Thought content normal.      UC Treatments / Results  Labs (all labs ordered are listed, but only abnormal results are displayed) Labs Reviewed  POC COVID19/FLU A&B COMBO    EKG   Radiology No results found.  Procedures Procedures (including critical care time)  Medications Ordered in UC Medications  dexamethasone (DECADRON) injection 10 mg (10 mg Intramuscular Given 12/08/23 2007)    Initial Impression / Assessment and Plan / UC Course  I have reviewed the triage vital signs and the nursing notes.  Pertinent labs & imaging results that were available during my care of the patient were reviewed by me and considered in my medical decision making (see chart for details).     Hypertensive in triage, otherwise vital signs reassuring.  Discussed safe over-the-counter medications that would not raise blood pressure.  Will treat with IM Decadron for wheezing and shortness of breath, rapid flu and COVID-negative so suspect a different respiratory virus causing symptoms.  Albuterol as needed, Phenergan DM, supportive over-the-counter medications and home care  additionally.  Return for any worsening symptoms.  Final Clinical Impressions(s) / UC Diagnoses   Final diagnoses:  Viral URI with cough  Wheezing  SOB (shortness of breath)  Elevated blood pressure reading   Discharge Instructions   None    ED Prescriptions     Medication Sig Dispense Auth. Provider   albuterol (VENTOLIN HFA) 108 (90 Base) MCG/ACT inhaler Inhale 2 puffs into the lungs every 4 (four) hours as needed for wheezing or shortness of breath. 18 g Roosvelt Maser [place], [place]   promethazine-dextromethorphan (PROMETHAZINE-DM) 6.25-15 MG/5ML syrup Take 5 mLs by mouth 4 (four) times daily as needed. 100 mL Particia Nearing, [place]      PDMP not reviewed this encounter.   Particia Nearing, [place] 12/09/23 1850

## 2023-12-11 ENCOUNTER — Encounter (HOSPITAL_COMMUNITY): Payer: 59 | Admitting: Physical Therapy

## 2023-12-12 ENCOUNTER — Encounter (HOSPITAL_COMMUNITY): Payer: Self-pay

## 2023-12-12 ENCOUNTER — Ambulatory Visit (HOSPITAL_COMMUNITY): Payer: 59

## 2023-12-12 ENCOUNTER — Encounter (HOSPITAL_COMMUNITY): Payer: Self-pay | Admitting: Occupational Therapy

## 2023-12-12 ENCOUNTER — Ambulatory Visit (HOSPITAL_COMMUNITY): Payer: 59 | Admitting: Occupational Therapy

## 2023-12-12 DIAGNOSIS — R29898 Other symptoms and signs involving the musculoskeletal system: Secondary | ICD-10-CM | POA: Diagnosis not present

## 2023-12-12 DIAGNOSIS — R2681 Unsteadiness on feet: Secondary | ICD-10-CM | POA: Diagnosis not present

## 2023-12-12 DIAGNOSIS — M25611 Stiffness of right shoulder, not elsewhere classified: Secondary | ICD-10-CM | POA: Diagnosis not present

## 2023-12-12 DIAGNOSIS — M25511 Pain in right shoulder: Secondary | ICD-10-CM | POA: Diagnosis not present

## 2023-12-12 DIAGNOSIS — R262 Difficulty in walking, not elsewhere classified: Secondary | ICD-10-CM

## 2023-12-12 NOTE — Therapy (Signed)
OUTPATIENT OCCUPATIONAL THERAPY ORTHO TREATMENT NOTE  Patient Name: Sheryl [name] MRN: [id number] DOB:03-27-1956, 68 y.o., female Today's Date: 12/12/2023   END OF SESSION:  OT End of Session - 12/12/23 0932     Visit Number 4    Number of Visits 8    Date for OT Re-Evaluation 01/02/24    Authorization Type [employer] Dual Complete    OT Start Time 0851    OT Stop Time 0931    OT Time Calculation (min) 40 min    Activity Tolerance Patient tolerated treatment well    Behavior During Therapy [facility] for tasks assessed/performed;Anxious               Past Medical History:  Diagnosis Date   Chronic back pain 10/22/2011   Chronic neck pain 10/22/2011   Depression    HLD (hyperlipidemia)    Hypertension    Past Surgical History:  Procedure Laterality Date   BREAST CYST EXCISION     right   COLONOSCOPY     per patient, many years ago at [facility]   COLONOSCOPY WITH PROPOFOL N/A 11/16/2021   Procedure: COLONOSCOPY WITH PROPOFOL;  Surgeon: Corbin Ade, MD;  [facility]: [facility];  Service: Endoscopy;  Laterality: N/A;  9:15am   POLYPECTOMY  11/16/2021   Procedure: POLYPECTOMY;  Surgeon: Corbin Ade, MD;  Location: [facility];  Service: Endoscopy;;   TUBAL LIGATION     Patient Active Problem List   Diagnosis Date Noted   History of wheezing 11/15/2021   Dental abscess 11/15/2021   Colon cancer screening 09/03/2021   Pain, dental 10/12/2020   Hyperlipemia 09/07/2020   Borderline diabetes 09/06/2020   Bullous impetigo 07/26/2015   Mixed incontinence urge and stress 07/25/2015   Rotator cuff syndrome of left shoulder 12/19/2014   Difficulty walking 05/06/2012   Stiffness of vertebral column 05/06/2012   Decreased strength 05/06/2012   Class 3 obesity 04/16/2012   MDD (major depressive disorder) 04/16/2012   Essential hypertension, benign 04/16/2012   Back pain 04/16/2012   Cervical strain, acute 04/16/2012    PCP: Renaye Rakers, MD REFERRING PROVIDER:  Renaye Rakers, MD  ONSET DATE: ~9 months  REFERRING DIAG: M79.601 (ICD-10-CM) - Right arm pain   THERAPY DIAG:  Right shoulder pain, unspecified chronicity  Shoulder stiffness, right  Other symptoms and signs involving the musculoskeletal system  Rationale for Evaluation and Treatment: Rehabilitation  SUBJECTIVE:   SUBJECTIVE STATEMENT: "I got here early so I wouldn't be as confused." Pt accompanied by: self and family member  PERTINENT HISTORY: Pt reports that she has been having difficulty with controlling and mobilizing her RUE "for a while" and reports that pain has been significantly increasing. There has been no referral to an Orthopedic at this time. Pt also presenting with balance deficits and periods of confusion/difficulty answering questions, where she becomes frustrated and starts to tear up.   PRECAUTIONS: None  WEIGHT BEARING RESTRICTIONS: No  PAIN:  Are you having pain? Yes: NPRS scale: 8/10 Pain location: all over RUE Pain description: aching, sharp Aggravating factors: movement Relieving factors: unsure  FALLS: Has patient fallen in last 6 months? No  PLOF: Independent  PATIENT GOALS: "To reduce pain"  NEXT MD VISIT: 11/28/23  OBJECTIVE:   HAND DOMINANCE: Right  ADLs: Overall ADLs: Pain limits her tolerance to completing ADL's and IADL's efficiently. She uses her LUE more, as well as compensatory strategies.  FUNCTIONAL OUTCOME MEASURES: Quick Dash: 45.45  UPPER EXTREMITY ROM:  Assessed in seated, er/IR adducted  Active ROM Right eval  Shoulder flexion 141  Shoulder abduction 139  Shoulder internal rotation 90  Shoulder external rotation 9  (Blank rows = not tested)    UPPER EXTREMITY MMT:     Assessed in seated, er/IR adducted  MMT Right eval  Shoulder flexion 4-/5  Shoulder abduction 4/5  Shoulder internal rotation 4+/5  Shoulder external rotation 4/5 (w/ pain)  (Blank rows = not tested)  HAND FUNCTION: Grip strength:  Right: 29 lbs; Left: 29 lbs  SENSATION: WFL  EDEMA: Pt reports sometimes the upper arm feels swollen.  COGNITION: Overall cognitive status:  Pt emotional with questions, requiring increased time to answer and at times appears to have difficulty understanding the questions.  Areas of impairment: Awareness: Deficits Poor awareness of all deficits Behavior: Poor frustration tolerance and Lability  OBSERVATIONS: Moderate fascial restrictions along deltoid, trapezius, and biceps.    TODAY'S TREATMENT:                                                                                                                              DATE:   12/12/23 -Manual therapy: Myofascial release and trigger point applied to biceps, trapezius, deltoid, and scapular region in order to reduce pain and fascial restriction, as well as improve ROM.  -AA/ROM: supine, flexion, abduction, Protraction, horizontal abduction, er/IR, x12 -A/ROM: supine, flexion, abduction, Protraction, horizontal abduction, er/IR, x10 -Scapular Strengthening: red band, extension, retraction, rows, x10 -UBE: level 1, 2.5' forwards and backwards, pace: 3.0+  12/06/23 -Manual therapy: Myofascial release and trigger point applied to biceps, trapezius, deltoid, and scapular region in order to reduce pain and fascial restriction, as well as improve ROM.  -P/ROM: supine, flexion, abduction, er/IR, x6 -AA/ROM: supine, flexion, abduction, Protraction, horizontal abduction, er/IR, x10 -Scapular ROM: elevation/depression, retraction/protraction, x10 -Proximal strengthening: paddles, criss cross, circles and reverse 30 seconds 2 sets  -UBE bike: 2 mins forwards 2 mins backwards 3 pace level 1   12/01/23 -Manual therapy: Myofascial release and trigger point applied to biceps, trapezius, deltoid, and scapular region in order to reduce pain and fascial restriction, as well as improve ROM.  -P/ROM: supine, flexion, abduction, er/IR, x6 -AA/ROM: supine,  flexion, abduction, Protraction, horizontal abduction, er/IR, x10 -isometrics: flexion, extension, abduction, IR, 4x10" -Scapular ROM: elevation/depression, retraction/protraction, x10   PATIENT EDUCATION: Education details: Publishing rights manager Person educated: Patient and Child(ren) Education method: Explanation, Demonstration, and Handouts Education comprehension: verbalized understanding and returned demonstration  HOME EXERCISE PROGRAM: 2/6: Pendulums and Table Slides 2/10: AA/ROM, Isometrics 2/14: A/ROM  2/21: Scapular strengthening  GOALS: Goals reviewed with patient? Yes   SHORT TERM GOALS: Target date: 01/02/24  Pt will be provided with and educated on HEP to improve mobility in RUE required for use during ADL completion.   Goal status: IN PROGRESS  Pt will decrease pain in RUE to 3/10 or less to improve ability to sleep for 2+ consecutive hours without waking due to pain.  Goal status: IN PROGRESS  2.  Pt will decrease RUE fascial restrictions to min amounts or less to improve mobility required for functional reaching tasks.   Goal status: IN PROGRESS  3.  Pt will increase RUE A/ROM by 20 degrees to improve ability to use RUE when reaching overhead or behind back during dressing and bathing tasks.   Goal status: IN PROGRESS  4.  Pt will increase RUE strength to 4+/5 or greater to improve ability to use RUE when lifting or carrying items during meal preparation/housework/yardwork tasks.   Goal status: IN PROGRESS  5.  Pt will return to highest level of function using RUE as dominant during functional task completion.   Goal status: IN PROGRESS   ASSESSMENT:  CLINICAL IMPRESSION: This session pt working on improving ROM and starting strengthening. She was able to demonstrate improved ROM to full movements actively, with mild pain throughout session. Her shoulder had multiple audible popping through the session. With increased tasks and repetition, she  required frequent rest breaks due to fatigue and pain. OT adding scapular strengthening this session for low level strengthening. Verbal and tactile cuing provided throughout session for positioning and technique.   PERFORMANCE DEFICITS: in functional skills including in functional skills including ADLs, IADLs, coordination, tone, ROM, strength, pain, fascial restrictions, muscle spasms, and UE functional use, cognitive skills including emotional, memory, and safety awareness, and psychosocial skills including coping strategies, interpersonal interactions, and routines and behaviors.    PLAN:  OT FREQUENCY: 2x/week  OT DURATION: 4 weeks  PLANNED INTERVENTIONS: 97168 OT Re-evaluation, 97535 self care/ADL training, 16109 therapeutic exercise, 97530 therapeutic activity, 97112 neuromuscular re-education, 97140 manual therapy, 97035 ultrasound, 97010 moist heat, 97032 electrical stimulation (manual), passive range of motion, functional mobility training, energy conservation, coping strategies training, patient/family education, and DME and/or AE instructions  RECOMMENDED OTHER SERVICES: Orthopedic MD referral  CONSULTED AND AGREED WITH PLAN OF CARE: Patient and family member/caregiver  PLAN FOR NEXT SESSION: Manual Therapy, AA/ROM, A/ROM, Proximal shoulder exercises   Trish Mage, OTR/L [facility] [phone] Kennyth Arnold, OT 12/12/2023, 9:33 AM

## 2023-12-12 NOTE — Therapy (Signed)
OUTPATIENT PHYSICAL THERAPY LOWER EXTREMITY TREATMENT   Patient Name: BRANTLEE HINDE MRN: [id number] DOB:1956-02-23, 68 y.o., female Today's Date: 12/12/2023  END OF SESSION:  PT End of Session - 12/12/23 0933     Visit Number 2    Number of Visits 8    Date for PT Re-Evaluation 12/26/23    Authorization Type U[employer] Medicare    PT Start Time 0932    PT Stop Time 1010    PT Time Calculation (min) 38 min    Activity Tolerance Patient tolerated treatment well    Behavior During Therapy [facility] for tasks assessed/performed             Past Medical History:  Diagnosis Date   Chronic back pain 10/22/2011   Chronic neck pain 10/22/2011   Depression    HLD (hyperlipidemia)    Hypertension    Past Surgical History:  Procedure Laterality Date   BREAST CYST EXCISION     right   COLONOSCOPY     per patient, many years ago at [name]   COLONOSCOPY WITH PROPOFOL N/A 11/16/2021   Procedure: COLONOSCOPY WITH PROPOFOL;  Surgeon: Corbin Ade, MD;  [facility];  Service: Endoscopy;  Laterality: N/A;  9:15am   POLYPECTOMY  11/16/2021   Procedure: POLYPECTOMY;  Surgeon: Corbin Ade, MD;  Location[facility];  Service: Endoscopy;;   TUBAL LIGATION     Patient Active Problem List   Diagnosis Date Noted   History of wheezing 11/15/2021   Dental abscess 11/15/2021   Colon cancer screening 09/03/2021   Pain, dental 10/12/2020   Hyperlipemia 09/07/2020   Borderline diabetes 09/06/2020   Bullous impetigo 07/26/2015   Mixed incontinence urge and stress 07/25/2015   Rotator cuff syndrome of left shoulder 12/19/2014   Difficulty walking 05/06/2012   Stiffness of vertebral column 05/06/2012   Decreased strength 05/06/2012   Class 3 obesity 04/16/2012   MDD (major depressive disorder) 04/16/2012   Essential hypertension, benign 04/16/2012   Back pain 04/16/2012   Cervical strain, acute 04/16/2012    PCP: Renaye Rakers, MD  REFERRING PROVIDER: Renaye Rakers,  MD REFERRING DIAG: R26.81 (ICD-10-CM) - Unsteady gait  THERAPY DIAG:  Other symptoms and signs involving the musculoskeletal system  Difficulty in walking, not elsewhere classified  Unsteady gait  Rationale for Evaluation and Treatment: Rehabilitation  ONSET DATE: 1 year  SUBJECTIVE:   SUBJECTIVE STATEMENT: 12/12/23:  Reports she on steroid and cough medication do address cold she has.  No reports of pain or recent fall.  Eval:  Patient reports she has a hard time walking/ keeping her balance sometimes; it's not all the time.  She has to furniture walk in her home sometimes to keep her balance.  Patient seems occasionally confused during subjective today. She states she sometimes walks sideways; seems unsure of any cause of her unbalance.  States she is borderline DM, She does take blood pressure medication.  Some right side shoulder and leg pain.  Daughters or sisters usually come to her appts with her.  She becomes emotional during interview this morning. Right handed; having trouble with controlling right arm BP left arm 150/96 has not taken her BP meds this morning; daughter arrives at appt. And states her daughters are concerned about her cognition and right side weakness, complaints of pain  PERTINENT HISTORY: Significant depression per her daughter  PAIN:  Are you having pain? Yes: NPRS scale: 8/10 right shoulder, 0-10/10 right leg Pain location: right shoulder  and right leg Pain description: tight,  throbbing, aching Aggravating factors: leg worse at night, movement right arm, weaker Relieving factors: massage, muscle rub, BC powder  PRECAUTIONS: Fall     WEIGHT BEARING RESTRICTIONS: No  FALLS:  Has patient fallen in last 6 months? No  LIVING ENVIRONMENT: Lives with: lives with an adult companion Lives in: House/apartment Stairs: Yes: External: 5 steps; on right going up, on left going up, and bilateral but cannot reach both Has following equipment at home: Single  point cane and Quad cane small base  OCCUPATION: not working  PLOF: Independent  PATIENT GOALS: walk without worrying about falling  NEXT MD VISIT: 11/28/23  OBJECTIVE:  Note: Objective measures were completed at Evaluation unless otherwise noted.  DIAGNOSTIC FINDINGS: none  PATIENT SURVEYS:  LEFS 38/80 47.5%  COGNITION: Overall cognitive status:  some difficulty answering more than yes/no questions; visibly frustrated and starts crying during subjective; improves when her daughter [name] arrives who states her family is concerned about her cognition      SENSATION: Appears intact at eval  EDEMA:  None noted  POSTURE: rounded shoulders, forward head, and heavy chested  PALPATION: Not specifically tested today  LOWER EXTREMITY ROM:  Active ROM Right eval Left eval  Hip flexion    Hip extension    Hip abduction    Hip adduction    Hip internal rotation    Hip external rotation    Knee flexion    Knee extension    Ankle dorsiflexion    Ankle plantarflexion    Ankle inversion    Ankle eversion     (Blank rows = not tested)  LOWER EXTREMITY MMT:  MMT Right eval Left eval  Hip flexion 4 4-  Hip extension 3- 3-  Hip abduction    Hip adduction    Hip internal rotation    Hip external rotation    Knee flexion 4 4-  Knee extension 4+ 4+  Ankle dorsiflexion 4- 5  Ankle plantarflexion    Ankle inversion    Ankle eversion     (Blank rows = not tested)    FUNCTIONAL TESTS:  5 times sit to stand: 27.97 sec using hands to assist up to standin Timed up and go (TUG): 16.29  no AD SLS 3" each leg  GAIT: Distance walked: 50 ft in clinic Assistive device utilized: None Level of assistance: SBA Comments: touches wall as she walks; sometimes tends to walk with small base of support                                                                                                                                TREATMENT DATE:  12/12/23: Reviewed goals Educated  importance of HEP compliance for maximal benefits  Supine: Bridge 2x 10 5" holds  Seated: STS no HHA eccentric control 10x Adduction squeeze 10x 5"  Standing: SLS Lt 7", Rt 8" Abduction with HHA 10x 3" March  10x 3" alternating Heel raise 10x  Tandem stance 1x 20"   DGI 1. Gait level surface (2) Mild Impairment: Walks 20', uses assistive devices, slower speed, mild gait deviations. 2. Change in gait speed (1) Moderate Impairment: Makes only minor adjustments to walking speed, or accomplishes a change in speed with significant gait deviations, or changes speed but has significant gait deviations, or changes speed but loses balance but is able to recover and continue walking. 3. Gait with horizontal head turns (1) Moderate Impairment: Performs head turns with moderate change in gait velocity, slows down, staggers but recovers, can continue to walk. 4. Gait with vertical head turns 1) Moderate Impairment: Performs head turns with moderate change in gait velocity, slows down, staggers but recovers, can continue to walk. 5. Gait and pivot turn (2) Mild Impairment: Pivot turns safely in > 3 seconds and stops with no loss of balance. 6. Step over obstacle (2) Mild Impairment: Is able to step over box, but must slow down and adjust steps to clear box safely. 7. Step around obstacles (2) Mild Impairment: Is able to step around both cones, but must slow down and adjust steps to clear cones. 8. Stairs (1) Moderate Impairment: Two feet to a stair, must use rail.  TOTAL SCORE: 12 / 24   11/27/23 physical therapy evaluation and HEP instruction    PATIENT EDUCATION:  Education details: Patient educated on exam findings, POC, scope of PT, HEP, and benefit of using a cane for ambulation outside of home. Person educated: Patient Education method: Explanation, Demonstration, and Handouts Education comprehension: verbalized understanding, returned demonstration, verbal cues required, and  tactile cues required  HOME EXERCISE PROGRAM: Access Code: [email or web address] URL: [email or web address] Date: 11/27/2023 Prepared by: [facility]  Exercises - Sit to Stand with Armchair  - 2 x daily - 7 x weekly - 1 sets - 10 reps - standing single leg balance at the counter (try to not hold on)  - 2 x daily - 7 x weekly - 1 sets - 3 reps - 10 sec hold  12/12/23:  Bridge 10x 2 set  ASSESSMENT:  CLINICAL IMPRESSION: 12/12/23:  Reviewed goals and educated importance of HEP compliance for maximal benefits.  DGI complete with score of 12/24 assessing dynamic gait.  Exercises added for gluteal and LE strengthening as well as balance associated exercises which required intermittent HHA.  Reviewed current exercise program with additional handout given to improve follow thru at home.  Eval:  Patient is a 68 y.o. female who was seen today for physical therapy evaluation and treatment for R26.81 (ICD-10-CM) - Unsteady gait.  Patient demonstrates decreased strength, balance deficits and gait abnormalities which are negatively impacting patient ability to perform ADLs and functional mobility tasks. Patient will benefit from skilled physical therapy services to address these deficits to improve level of function with ADLs, functional mobility tasks, and reduce risk for falls.    OBJECTIVE IMPAIRMENTS: Abnormal gait, decreased balance, decreased cognition, decreased knowledge of condition, decreased knowledge of use of DME, difficulty walking, decreased strength, increased fascial restrictions, impaired perceived functional ability, and pain.   ACTIVITY LIMITATIONS: bending, standing, squatting, sleeping, stairs, transfers, and locomotion level  PARTICIPATION LIMITATIONS: meal prep, cleaning, laundry, driving, shopping, and community activity  PERSONAL FACTORS: 1 comorbidity: depression  are also affecting patient's functional outcome.   REHAB POTENTIAL: Good  CLINICAL DECISION MAKING:  Evolving/moderate complexity  EVALUATION COMPLEXITY: Moderate   GOALS: Goals reviewed with patient? No  SHORT TERM GOALS: Target date: 12/11/2023 patient  will be independent with initial HEP  Baseline: Goal status: INITIAL  2.  Patient will improve SLS to 5" each leg to demonstrate improved functional balance Baseline:  Goal status: INITIAL  LONG TERM GOALS: Target date: 3 /03/2024  Patient will be independent in self management strategies to improve quality of life and functional outcomes.  Baseline:  Goal status: INITIAL  2.  Patient will report 50% improvement overall  Baseline:  Goal status: INITIAL  3.  Patient will improve SLS to 10" each to demonstrate improved functional balance Baseline: 3" each Goal status: INITIAL  4.  Patient will increase  bilateral leg MMT's to 4+ to 5/5 to allow navigation of steps without gait deviation or loss of balance  Baseline: see above Goal status: INITIAL  5.  Patient will improve her TUG to 10" or less to demonstrate improved functional mobility and decreased fall risk Baseline: 16.29 sec Goal status: INITIAL  6.  Patient will improve 5 times sit to stand score to 15 sec or less to demonstrate improved functional mobility and increased leg strength.    Baseline: 27.97 Goal status: INITIAL   PLAN:  PT FREQUENCY: 2x/week  PT DURATION: 4 weeks  PLANNED INTERVENTIONS: 97164- PT Re-evaluation, 97110-Therapeutic exercises, 97530- Therapeutic activity, 97112- Neuromuscular re-education, 97535- Self Care, 09811- Manual therapy, [phone]- Gait training, [phone]- Orthotic Fit/training, [phone]- Canalith repositioning, [id number]- Aquatic Therapy, [phone]- Splinting, Patient/Family education, Balance training, Stair training, Taping, Dry Needling, Joint mobilization, Joint manipulation, Spinal manipulation, Spinal mobilization, Scar mobilization, and DME instructions.   PLAN FOR NEXT SESSION:  patient appears to have some cognitive  impairments so has a hard time answering questions sometimes and can get frustrated/emotional with questioning.  Balance training, strength training of legs; progress HEP; seeing OT for right shoulder.   Becky Sax, LPTA/CLT; [facility] [phone]  2:27 PM, 12/12/23

## 2023-12-12 NOTE — Patient Instructions (Signed)

## 2023-12-15 ENCOUNTER — Encounter (HOSPITAL_COMMUNITY): Payer: Self-pay | Admitting: Occupational Therapy

## 2023-12-15 ENCOUNTER — Ambulatory Visit (HOSPITAL_COMMUNITY): Payer: 59 | Admitting: Occupational Therapy

## 2023-12-15 DIAGNOSIS — R29898 Other symptoms and signs involving the musculoskeletal system: Secondary | ICD-10-CM | POA: Diagnosis not present

## 2023-12-15 DIAGNOSIS — M25611 Stiffness of right shoulder, not elsewhere classified: Secondary | ICD-10-CM | POA: Diagnosis not present

## 2023-12-15 DIAGNOSIS — M25511 Pain in right shoulder: Secondary | ICD-10-CM | POA: Diagnosis not present

## 2023-12-15 DIAGNOSIS — R2681 Unsteadiness on feet: Secondary | ICD-10-CM | POA: Diagnosis not present

## 2023-12-15 DIAGNOSIS — R262 Difficulty in walking, not elsewhere classified: Secondary | ICD-10-CM | POA: Diagnosis not present

## 2023-12-15 NOTE — Therapy (Signed)
 OUTPATIENT OCCUPATIONAL THERAPY ORTHO TREATMENT NOTE  Patient Name: Sheryl [name] MRN: [id number] DOB:May 13, 1956, 68 y.o., female Today's Date: 12/15/2023   END OF SESSION:  OT End of Session - 12/15/23 0846     Visit Number 5    Number of Visits 8    Date for OT Re-Evaluation 01/02/24    Authorization Type [employer] Dual Complete    OT Start Time 0805    OT Stop Time 0846    OT Time Calculation (min) 41 min    Activity Tolerance Patient tolerated treatment well    Behavior During Therapy [facility] for tasks assessed/performed;Anxious             Past Medical History:  Diagnosis Date   Chronic back pain 10/22/2011   Chronic neck pain 10/22/2011   Depression    HLD (hyperlipidemia)    Hypertension    Past Surgical History:  Procedure Laterality Date   BREAST CYST EXCISION     right   COLONOSCOPY     per patient, many years ago at [facility]   COLONOSCOPY WITH PROPOFOL N/A 11/16/2021   Procedure: COLONOSCOPY WITH PROPOFOL;  Surgeon: Corbin Ade, MD;  [facility]: [facility];  Service: Endoscopy;  Laterality: N/A;  9:15am   POLYPECTOMY  11/16/2021   Procedure: POLYPECTOMY;  Surgeon: Corbin Ade, MD;  Location: [facility];  Service: Endoscopy;;   TUBAL LIGATION     Patient Active Problem List   Diagnosis Date Noted   History of wheezing 11/15/2021   Dental abscess 11/15/2021   Colon cancer screening 09/03/2021   Pain, dental 10/12/2020   Hyperlipemia 09/07/2020   Borderline diabetes 09/06/2020   Bullous impetigo 07/26/2015   Mixed incontinence urge and stress 07/25/2015   Rotator cuff syndrome of left shoulder 12/19/2014   Difficulty walking 05/06/2012   Stiffness of vertebral column 05/06/2012   Decreased strength 05/06/2012   Class 3 obesity 04/16/2012   MDD (major depressive disorder) 04/16/2012   Essential hypertension, benign 04/16/2012   Back pain 04/16/2012   Cervical strain, acute 04/16/2012    PCP: Renaye Rakers, MD REFERRING PROVIDER: Renaye Rakers, MD  ONSET DATE: ~9 months  REFERRING DIAG: M79.601 (ICD-10-CM) - Right arm pain   THERAPY DIAG:  Right shoulder pain, unspecified chronicity  Shoulder stiffness, right  Other symptoms and signs involving the musculoskeletal system  Rationale for Evaluation and Treatment: Rehabilitation  SUBJECTIVE:   SUBJECTIVE STATEMENT: "I'm a little sore today" Pt accompanied by: self and family member  PERTINENT HISTORY: Pt reports that she has been having difficulty with controlling and mobilizing her RUE "for a while" and reports that pain has been significantly increasing. There has been no referral to an Orthopedic at this time. Pt also presenting with balance deficits and periods of confusion/difficulty answering questions, where she becomes frustrated and starts to tear up.   PRECAUTIONS: None  WEIGHT BEARING RESTRICTIONS: No  PAIN:  Are you having pain? Yes: NPRS scale: 3/10 Pain location: all over RUE Pain description: aching, sharp Aggravating factors: movement Relieving factors: unsure  FALLS: Has patient fallen in last 6 months? No  PLOF: Independent  PATIENT GOALS: "To reduce pain"  NEXT MD VISIT: 11/28/23  OBJECTIVE:   HAND DOMINANCE: Right  ADLs: Overall ADLs: Pain limits her tolerance to completing ADL's and IADL's efficiently. She uses her LUE more, as well as compensatory strategies.  FUNCTIONAL OUTCOME MEASURES: Quick Dash: 45.45  UPPER EXTREMITY ROM:       Assessed in seated,  er/IR adducted  Active ROM Right eval  Shoulder flexion 141  Shoulder abduction 139  Shoulder internal rotation 90  Shoulder external rotation 9  (Blank rows = not tested)    UPPER EXTREMITY MMT:     Assessed in seated, er/IR adducted  MMT Right eval  Shoulder flexion 4-/5  Shoulder abduction 4/5  Shoulder internal rotation 4+/5  Shoulder external rotation 4/5 (w/ pain)  (Blank rows = not tested)  HAND FUNCTION: Grip strength: Right: 29 lbs; Left: 29  lbs  SENSATION: WFL  EDEMA: Pt reports sometimes the upper arm feels swollen.  COGNITION: Overall cognitive status:  Pt emotional with questions, requiring increased time to answer and at times appears to have difficulty understanding the questions.  Areas of impairment: Awareness: Deficits Poor awareness of all deficits Behavior: Poor frustration tolerance and Lability  OBSERVATIONS: Moderate fascial restrictions along deltoid, trapezius, and biceps.    TODAY'S TREATMENT:                                                                                                                              DATE:   12/15/23 -Manual therapy: Myofascial release and trigger point applied to biceps, trapezius, deltoid, and scapular region in order to reduce pain and fascial restriction, as well as improve ROM.  -A/ROM: supine, flexion, abduction, Protraction, horizontal abduction, er/IR, x12 -Proximal Shoulder Exercises: paddles, criss cross, circles both directions, x10 -Functional Reaching: 1# 1st and 2nd shelf x10, 2# 1st shelf x10 -Theraball Exercises: flexion, overhead press, protraction, V ups, circles both directions, x10 -UBE: level 2,  2.5' forwards and backwards, pace: 3.0+  12/12/23 -Manual therapy: Myofascial release and trigger point applied to biceps, trapezius, deltoid, and scapular region in order to reduce pain and fascial restriction, as well as improve ROM.  -AA/ROM: supine, flexion, abduction, Protraction, horizontal abduction, er/IR, x12 -A/ROM: supine, flexion, abduction, Protraction, horizontal abduction, er/IR, x10 -Scapular Strengthening: red band, extension, retraction, rows, x10 -UBE: level 1, 2.5' forwards and backwards, pace: 3.0+  12/06/23 -Manual therapy: Myofascial release and trigger point applied to biceps, trapezius, deltoid, and scapular region in order to reduce pain and fascial restriction, as well as improve ROM.  -P/ROM: supine, flexion, abduction, er/IR,  x6 -AA/ROM: supine, flexion, abduction, Protraction, horizontal abduction, er/IR, x10 -Scapular ROM: elevation/depression, retraction/protraction, x10 -Proximal strengthening: paddles, criss cross, circles and reverse 30 seconds 2 sets  -UBE bike: 2 mins forwards 2 mins backwards 3 pace level 1    PATIENT EDUCATION: Education details: Theraball Exercises Person educated: Patient and Child(ren) Education method: Explanation, Demonstration, and Handouts Education comprehension: verbalized understanding and returned demonstration  HOME EXERCISE PROGRAM: 2/6: Pendulums and Table Slides 2/10: AA/ROM, Isometrics 2/14: A/ROM  2/21: Scapular strengthening 2/24: Theraball Exercises  GOALS: Goals reviewed with patient? Yes   SHORT TERM GOALS: Target date: 01/02/24  Pt will be provided with and educated on HEP to improve mobility in RUE required for use during ADL completion.   Goal status:  IN PROGRESS  Pt will decrease pain in RUE to 3/10 or less to improve ability to sleep for 2+ consecutive hours without waking due to pain.   Goal status: IN PROGRESS  2.  Pt will decrease RUE fascial restrictions to min amounts or less to improve mobility required for functional reaching tasks.   Goal status: IN PROGRESS  3.  Pt will increase RUE A/ROM by 20 degrees to improve ability to use RUE when reaching overhead or behind back during dressing and bathing tasks.   Goal status: IN PROGRESS  4.  Pt will increase RUE strength to 4+/5 or greater to improve ability to use RUE when lifting or carrying items during meal preparation/housework/yardwork tasks.   Goal status: IN PROGRESS  5.  Pt will return to highest level of function using RUE as dominant during functional task completion.   Goal status: IN PROGRESS   ASSESSMENT:  CLINICAL IMPRESSION: This session pt continuing to demonstrate improving ROM and strength. She was able to tolerate functional reaching with 1 and 2lb weights with  minimal discomfort this session. OT also added theraball exercises for continued strengthening with a light weight basketball. Verbal and tactile cuing provided for positioning and technique throughout the session.   PERFORMANCE DEFICITS: in functional skills including in functional skills including ADLs, IADLs, coordination, tone, ROM, strength, pain, fascial restrictions, muscle spasms, and UE functional use, cognitive skills including emotional, memory, and safety awareness, and psychosocial skills including coping strategies, interpersonal interactions, and routines and behaviors.    PLAN:  OT FREQUENCY: 2x/week  OT DURATION: 4 weeks  PLANNED INTERVENTIONS: 97168 OT Re-evaluation, 97535 self care/ADL training, 16109 therapeutic exercise, 97530 therapeutic activity, 97112 neuromuscular re-education, 97140 manual therapy, 97035 ultrasound, 97010 moist heat, 97032 electrical stimulation (manual), passive range of motion, functional mobility training, energy conservation, coping strategies training, patient/family education, and DME and/or AE instructions  RECOMMENDED OTHER SERVICES: Orthopedic MD referral  CONSULTED AND AGREED WITH PLAN OF CARE: Patient and family member/caregiver  PLAN FOR NEXT SESSION: Manual Therapy, AA/ROM, A/ROM, Proximal shoulder exercises   Trish Mage, OTR/L [facility] [phone] Kennyth Arnold, OT 12/15/2023, 8:47 AM

## 2023-12-15 NOTE — Patient Instructions (Signed)

## 2023-12-16 ENCOUNTER — Encounter (HOSPITAL_COMMUNITY): Payer: Self-pay

## 2023-12-16 ENCOUNTER — Ambulatory Visit (HOSPITAL_COMMUNITY): Payer: 59

## 2023-12-16 DIAGNOSIS — R2681 Unsteadiness on feet: Secondary | ICD-10-CM

## 2023-12-16 DIAGNOSIS — R262 Difficulty in walking, not elsewhere classified: Secondary | ICD-10-CM | POA: Diagnosis not present

## 2023-12-16 DIAGNOSIS — M25511 Pain in right shoulder: Secondary | ICD-10-CM | POA: Diagnosis not present

## 2023-12-16 DIAGNOSIS — M25611 Stiffness of right shoulder, not elsewhere classified: Secondary | ICD-10-CM | POA: Diagnosis not present

## 2023-12-16 DIAGNOSIS — R29898 Other symptoms and signs involving the musculoskeletal system: Secondary | ICD-10-CM

## 2023-12-16 NOTE — Therapy (Signed)
 OUTPATIENT PHYSICAL THERAPY LOWER EXTREMITY TREATMENT   Patient Name: Sheryl [name] MRN: [id number] DOB:03/13/1956, 68 y.o., female Today's Date: 12/16/2023  END OF SESSION:  PT End of Session - 12/16/23 0839     Visit Number 3    Number of Visits 8    Date for PT Re-Evaluation 12/26/23    Authorization Type [employer] Medicare    Authorization Time Period please check auth    PT Start Time 0802    PT Stop Time 0840    PT Time Calculation (min) 38 min    Activity Tolerance Patient tolerated treatment well    Behavior During Therapy [facility] for tasks assessed/performed;Anxious              Past Medical History:  Diagnosis Date   Chronic back pain 10/22/2011   Chronic neck pain 10/22/2011   Depression    HLD (hyperlipidemia)    Hypertension    Past Surgical History:  Procedure Laterality Date   BREAST CYST EXCISION     right   COLONOSCOPY     per patient, many years ago at [facility]   COLONOSCOPY WITH PROPOFOL N/A 11/16/2021   Procedure: COLONOSCOPY WITH PROPOFOL;  Surgeon: Sheryl Ade, MD;  [facility];  Service: Endoscopy;  Laterality: N/A;  9:15am   POLYPECTOMY  11/16/2021   Procedure: POLYPECTOMY;  Surgeon: Sheryl Ade, MD;  [facility];  Service: Endoscopy;;   TUBAL LIGATION     Patient Active Problem List   Diagnosis Date Noted   History of wheezing 11/15/2021   Dental abscess 11/15/2021   Colon cancer screening 09/03/2021   Pain, dental 10/12/2020   Hyperlipemia 09/07/2020   Borderline diabetes 09/06/2020   Bullous impetigo 07/26/2015   Mixed incontinence urge and stress 07/25/2015   Rotator cuff syndrome of left shoulder 12/19/2014   Difficulty walking 05/06/2012   Stiffness of vertebral column 05/06/2012   Decreased strength 05/06/2012   Class 3 obesity 04/16/2012   MDD (major depressive disorder) 04/16/2012   Essential hypertension, benign 04/16/2012   Back pain 04/16/2012   Cervical strain, acute 04/16/2012    PCP:  Sheryl Rakers, MD  REFERRING PROVIDER: Renaye Rakers, MD REFERRING DIAG: R26.81 (ICD-10-CM) - Unsteady gait  THERAPY DIAG:  Unsteady gait  Difficulty in walking, not elsewhere classified  Other symptoms and signs involving the musculoskeletal system  Rationale for Evaluation and Treatment: Rehabilitation  ONSET DATE: 1 year  SUBJECTIVE:   SUBJECTIVE STATEMENT: No pain, today. Pt was having some issues with HEP, regarding single leg stance.  Eval:  Patient reports she has a hard time walking/ keeping her balance sometimes; it's not all the time.  She has to furniture walk in her home sometimes to keep her balance.  Patient seems occasionally confused during subjective today. She states she sometimes walks sideways; seems unsure of any cause of her unbalance.  States she is borderline DM, She does take blood pressure medication.  Some right side shoulder and leg pain.  Daughters or sisters usually come to her appts with her.  She becomes emotional during interview this morning. Right handed; having trouble with controlling right arm BP left arm 150/96 has not taken her BP meds this morning; daughter arrives at appt. And states her daughters are concerned about her cognition and right side weakness, complaints of pain  PERTINENT HISTORY: Significant depression per her daughter  PAIN:  Are you having pain? Yes: NPRS scale: 8/10 right shoulder, 0-10/10 right leg Pain location:  right shoulder and right leg Pain description: tight,  throbbing, aching Aggravating factors: leg worse at night, movement right arm, weaker Relieving factors: massage, muscle rub, BC powder  PRECAUTIONS: Fall     WEIGHT BEARING RESTRICTIONS: No  FALLS:  Has patient fallen in last 6 months? No  LIVING ENVIRONMENT: Lives with: lives with an adult companion Lives in: House/apartment Stairs: Yes: External: 5 steps; on right going up, on left going up, and bilateral but cannot reach both Has following  equipment at home: Single point cane and Quad cane small base  OCCUPATION: not working  PLOF: Independent  PATIENT GOALS: walk without worrying about falling  NEXT MD VISIT: 11/28/23  OBJECTIVE:  Note: Objective measures were completed at Evaluation unless otherwise noted.  DIAGNOSTIC FINDINGS: none  PATIENT SURVEYS:  LEFS 38/80 47.5%  COGNITION: Overall cognitive status:  some difficulty answering more than yes/no questions; visibly frustrated and starts crying during subjective; improves when her daughter Sheryl [name] arrives who states her family is concerned about her cognition      SENSATION: Appears intact at eval  EDEMA:  None noted  POSTURE: rounded shoulders, forward head, and heavy chested  PALPATION: Not specifically tested today  LOWER EXTREMITY ROM:  Active ROM Right eval Left eval  Hip flexion    Hip extension    Hip abduction    Hip adduction    Hip internal rotation    Hip external rotation    Knee flexion    Knee extension    Ankle dorsiflexion    Ankle plantarflexion    Ankle inversion    Ankle eversion     (Blank rows = not tested)  LOWER EXTREMITY MMT:  MMT Right eval Left eval  Hip flexion 4 4-  Hip extension 3- 3-  Hip abduction    Hip adduction    Hip internal rotation    Hip external rotation    Knee flexion 4 4-  Knee extension 4+ 4+  Ankle dorsiflexion 4- 5  Ankle plantarflexion    Ankle inversion    Ankle eversion     (Blank rows = not tested)    FUNCTIONAL TESTS:  5 times sit to stand: 27.97 sec using hands to assist up to standin Timed up and go (TUG): 16.29  no AD SLS 3" each leg  GAIT: Distance walked: 50 ft in clinic Assistive device utilized: None Level of assistance: SBA Comments: touches wall as she walks; sometimes tends to walk with small base of support                                                                                                                                TREATMENT DATE:   12/16/2023  -Tandem stance with fingers hovering over // bars 3x1' -Sit/stand transfers from elevated chair with blue foam under feet for balance challenges with GTB around knees 2 x 5 with cues for anterior weight shift for anticipation for movement  patterns.  -Side stepping in // bars with GTB 2 x 1' -Standing heel raises on decline 2 x 10 -Seated toe raises x 20 -Standing toe taps on 7in box x 20 with no UE support- LOB x 1 laterally with min assist to correct  12/12/23: Reviewed goals Educated importance of HEP compliance for maximal benefits  Supine: Bridge 2x 10 5" holds  Seated: STS no HHA eccentric control 10x Adduction squeeze 10x 5"  Standing: SLS Lt 7", Rt 8" Abduction with HHA 10x 3" March 10x 3" alternating Heel raise 10x  Tandem stance 1x 20"   DGI 1. Gait level surface (2) Mild Impairment: Walks 20', uses assistive devices, slower speed, mild gait deviations. 2. Change in gait speed (1) Moderate Impairment: Makes only minor adjustments to walking speed, or accomplishes a change in speed with significant gait deviations, or changes speed but has significant gait deviations, or changes speed but loses balance but is able to recover and continue walking. 3. Gait with horizontal head turns (1) Moderate Impairment: Performs head turns with moderate change in gait velocity, slows down, staggers but recovers, can continue to walk. 4. Gait with vertical head turns 1) Moderate Impairment: Performs head turns with moderate change in gait velocity, slows down, staggers but recovers, can continue to walk. 5. Gait and pivot turn (2) Mild Impairment: Pivot turns safely in > 3 seconds and stops with no loss of balance. 6. Step over obstacle (2) Mild Impairment: Is able to step over box, but must slow down and adjust steps to clear box safely. 7. Step around obstacles (2) Mild Impairment: Is able to step around both cones, but must slow down and adjust steps to clear  cones. 8. Stairs (1) Moderate Impairment: Two feet to a stair, must use rail.  TOTAL SCORE: 12 / 24   11/27/23 physical therapy evaluation and HEP instruction    PATIENT EDUCATION:  Education details: Patient educated on exam findings, POC, scope of PT, HEP, and benefit of using a cane for ambulation outside of home. Person educated: Patient Education method: Explanation, Demonstration, and Handouts Education comprehension: verbalized understanding, returned demonstration, verbal cues required, and tactile cues required  HOME EXERCISE PROGRAM: Access Code: [email or web address] URL: [email or web address] Date: 11/27/2023 Prepared by: [facility]  Exercises - Sit to Stand with Armchair  - 2 x daily - 7 x weekly - 1 sets - 10 reps - standing single leg balance at the counter (try to not hold on)  - 2 x daily - 7 x weekly - 1 sets - 3 reps - 10 sec hold  12/12/23:  Bridge 10x 2 set  ASSESSMENT:  CLINICAL IMPRESSION: Pt with slow processing of interventions this session, needing slow guided, demonstrations and cues for proper execution. Pt with updated HEP with simple activities to promote increased participation. Pt with LOB at end of session during intervention due to poor motor planning. Pt will benefit from skilled Physical Therapy services to address deficits/limitations in order to improve functional and QOL.      OBJECTIVE IMPAIRMENTS: Abnormal gait, decreased balance, decreased cognition, decreased knowledge of condition, decreased knowledge of use of DME, difficulty walking, decreased strength, increased fascial restrictions, impaired perceived functional ability, and pain.   ACTIVITY LIMITATIONS: bending, standing, squatting, sleeping, stairs, transfers, and locomotion level  PARTICIPATION LIMITATIONS: meal prep, cleaning, laundry, driving, shopping, and community activity  PERSONAL FACTORS: 1 comorbidity: depression  are also affecting patient's functional outcome.    REHAB POTENTIAL: Good  CLINICAL DECISION MAKING: Evolving/moderate  complexity  EVALUATION COMPLEXITY: Moderate   GOALS: Goals reviewed with patient? No  SHORT TERM GOALS: Target date: 12/11/2023 patient will be independent with initial HEP  Baseline: Goal status: INITIAL  2.  Patient will improve SLS to 5" each leg to demonstrate improved functional balance Baseline:  Goal status: INITIAL  LONG TERM GOALS: Target date: 3 /03/2024  Patient will be independent in self management strategies to improve quality of life and functional outcomes.  Baseline:  Goal status: INITIAL  2.  Patient will report 50% improvement overall  Baseline:  Goal status: INITIAL  3.  Patient will improve SLS to 10" each to demonstrate improved functional balance Baseline: 3" each Goal status: INITIAL  4.  Patient will increase  bilateral leg MMT's to 4+ to 5/5 to allow navigation of steps without gait deviation or loss of balance  Baseline: see above Goal status: INITIAL  5.  Patient will improve her TUG to 10" or less to demonstrate improved functional mobility and decreased fall risk Baseline: 16.29 sec Goal status: INITIAL  6.  Patient will improve 5 times sit to stand score to 15 sec or less to demonstrate improved functional mobility and increased leg strength.    Baseline: 27.97 Goal status: INITIAL   PLAN:  PT FREQUENCY: 2x/week  PT DURATION: 4 weeks  PLANNED INTERVENTIONS: 97164- PT Re-evaluation, 97110-Therapeutic exercises, 97530- Therapeutic activity, 97112- Neuromuscular re-education, 97535- Self Care, 81191- Manual therapy, [phone]- Gait training, [phone]- Orthotic Fit/training, [phone]- Canalith repositioning, [id number]- Aquatic Therapy, [phone]- Splinting, Patient/Family education, Balance training, Stair training, Taping, Dry Needling, Joint mobilization, Joint manipulation, Spinal manipulation, Spinal mobilization, Scar mobilization, and DME instructions.   PLAN FOR NEXT  SESSION:  patient appears to have some cognitive impairments so has a hard time answering questions sometimes and can get frustrated/emotional with questioning.  Balance training, strength training of legs; progress HEP; seeing OT for right shoulder.   Nelida Meuse PT, DPT [facility]- [facility] [phone] office  8:42 AM, 12/16/23

## 2023-12-17 ENCOUNTER — Encounter (HOSPITAL_COMMUNITY): Payer: 59 | Admitting: Occupational Therapy

## 2023-12-18 ENCOUNTER — Encounter (HOSPITAL_COMMUNITY): Payer: 59 | Admitting: Physical Therapy

## 2023-12-22 ENCOUNTER — Ambulatory Visit (HOSPITAL_COMMUNITY): Payer: 59 | Attending: Family Medicine | Admitting: Occupational Therapy

## 2023-12-22 ENCOUNTER — Encounter (HOSPITAL_COMMUNITY): Payer: Self-pay | Admitting: Occupational Therapy

## 2023-12-22 DIAGNOSIS — M25611 Stiffness of right shoulder, not elsewhere classified: Secondary | ICD-10-CM | POA: Insufficient documentation

## 2023-12-22 DIAGNOSIS — M25511 Pain in right shoulder: Secondary | ICD-10-CM | POA: Diagnosis not present

## 2023-12-22 DIAGNOSIS — R2681 Unsteadiness on feet: Secondary | ICD-10-CM | POA: Insufficient documentation

## 2023-12-22 DIAGNOSIS — R29898 Other symptoms and signs involving the musculoskeletal system: Secondary | ICD-10-CM | POA: Insufficient documentation

## 2023-12-22 DIAGNOSIS — R262 Difficulty in walking, not elsewhere classified: Secondary | ICD-10-CM | POA: Insufficient documentation

## 2023-12-22 NOTE — Therapy (Unsigned)
 OUTPATIENT OCCUPATIONAL THERAPY ORTHO TREATMENT NOTE  Patient Name: Sheryl [name] MRN: [id number] DOB:02/26/1956, 68 y.o., female Today's Date: 12/22/2023   END OF SESSION:  OT End of Session - 12/22/23 0845     Visit Number 6    Number of Visits 8    Date for OT Re-Evaluation 01/02/24    Authorization Type [employer] Dual Complete    OT Start Time 0805    OT Stop Time 0845    OT Time Calculation (min) 40 min    Activity Tolerance Patient tolerated treatment well    Behavior During Therapy [facility] for tasks assessed/performed;Anxious              Past Medical History:  Diagnosis Date   Chronic back pain 10/22/2011   Chronic neck pain 10/22/2011   Depression    HLD (hyperlipidemia)    Hypertension    Past Surgical History:  Procedure Laterality Date   BREAST CYST EXCISION     right   COLONOSCOPY     per patient, many years ago at [facility]   COLONOSCOPY WITH PROPOFOL N/A 11/16/2021   Procedure: COLONOSCOPY WITH PROPOFOL;  Surgeon: Corbin Ade, MD;  [facility]: [facility];  Service: Endoscopy;  Laterality: N/A;  9:15am   POLYPECTOMY  11/16/2021   Procedure: POLYPECTOMY;  Surgeon: Corbin Ade, MD;  Location: [facility];  Service: Endoscopy;;   TUBAL LIGATION     Patient Active Problem List   Diagnosis Date Noted   History of wheezing 11/15/2021   Dental abscess 11/15/2021   Colon cancer screening 09/03/2021   Pain, dental 10/12/2020   Hyperlipemia 09/07/2020   Borderline diabetes 09/06/2020   Bullous impetigo 07/26/2015   Mixed incontinence urge and stress 07/25/2015   Rotator cuff syndrome of left shoulder 12/19/2014   Difficulty walking 05/06/2012   Stiffness of vertebral column 05/06/2012   Decreased strength 05/06/2012   Class 3 obesity 04/16/2012   MDD (major depressive disorder) 04/16/2012   Essential hypertension, benign 04/16/2012   Back pain 04/16/2012   Cervical strain, acute 04/16/2012    PCP: Renaye Rakers, MD REFERRING PROVIDER: Renaye Rakers, MD  ONSET DATE: ~9 months  REFERRING DIAG: M79.601 (ICD-10-CM) - Right arm pain   THERAPY DIAG:  Right shoulder pain, unspecified chronicity  Shoulder stiffness, right  Other symptoms and signs involving the musculoskeletal system  Rationale for Evaluation and Treatment: Rehabilitation  SUBJECTIVE:   SUBJECTIVE STATEMENT: "I'm a little sore today" Pt accompanied by: self and family member  PERTINENT HISTORY: Pt reports that she has been having difficulty with controlling and mobilizing her RUE "for a while" and reports that pain has been significantly increasing. There has been no referral to an Orthopedic at this time. Pt also presenting with balance deficits and periods of confusion/difficulty answering questions, where she becomes frustrated and starts to tear up.   PRECAUTIONS: None  WEIGHT BEARING RESTRICTIONS: No  PAIN:  Are you having pain? Yes: NPRS scale: 3/10 Pain location: all over RUE Pain description: aching, sharp Aggravating factors: movement Relieving factors: unsure  FALLS: Has patient fallen in last 6 months? No  PLOF: Independent  PATIENT GOALS: "To reduce pain"  NEXT MD VISIT: 11/28/23  OBJECTIVE:   HAND DOMINANCE: Right  ADLs: Overall ADLs: Pain limits her tolerance to completing ADL's and IADL's efficiently. She uses her LUE more, as well as compensatory strategies.  FUNCTIONAL OUTCOME MEASURES: Quick Dash: 45.45  UPPER EXTREMITY ROM:       Assessed in  seated, er/IR adducted  Active ROM Right eval  Shoulder flexion 141  Shoulder abduction 139  Shoulder internal rotation 90  Shoulder external rotation 9  (Blank rows = not tested)    UPPER EXTREMITY MMT:     Assessed in seated, er/IR adducted  MMT Right eval  Shoulder flexion 4-/5  Shoulder abduction 4/5  Shoulder internal rotation 4+/5  Shoulder external rotation 4/5 (w/ pain)  (Blank rows = not tested)  HAND FUNCTION: Grip strength: Right: 29 lbs; Left: 29  lbs  SENSATION: WFL  EDEMA: Pt reports sometimes the upper arm feels swollen.  COGNITION: Overall cognitive status:  Pt emotional with questions, requiring increased time to answer and at times appears to have difficulty understanding the questions.  Areas of impairment: Awareness: Deficits Poor awareness of all deficits Behavior: Poor frustration tolerance and Lability  OBSERVATIONS: Moderate fascial restrictions along deltoid, trapezius, and biceps.    TODAY'S TREATMENT:                                                                                                                              DATE:   12/22/23 -Manual therapy: Myofascial release and trigger point applied to biceps, trapezius, deltoid, and scapular region in order to reduce pain and fascial restriction, as well as improve ROM.  -A/ROM: seated, flexion, abduction, Protraction, horizontal abduction, er/IR, x12 -X to V arms x12 -Goal post arms x12 -Proximal Shoulder Exercises: paddles, criss cross, circles both directions, x10 -Scapular Strengthening: red band, extension, retraction, rows, x15 -Shoulder Strengthening: red band, horizontal abduction, er, IR, x15  12/15/23 -Manual therapy: Myofascial release and trigger point applied to biceps, trapezius, deltoid, and scapular region in order to reduce pain and fascial restriction, as well as improve ROM.  -A/ROM: supine, flexion, abduction, Protraction, horizontal abduction, er/IR, x12 -Proximal Shoulder Exercises: paddles, criss cross, circles both directions, x10 -Functional Reaching: 1# 1st and 2nd shelf x10, 2# 1st shelf x10 -Theraball Exercises: flexion, overhead press, protraction, V ups, circles both directions, x10 -UBE: level 2,  2.5' forwards and backwards, pace: 3.0+  12/12/23 -Manual therapy: Myofascial release and trigger point applied to biceps, trapezius, deltoid, and scapular region in order to reduce pain and fascial restriction, as well as improve ROM.   -AA/ROM: supine, flexion, abduction, Protraction, horizontal abduction, er/IR, x12 -A/ROM: supine, flexion, abduction, Protraction, horizontal abduction, er/IR, x10 -Scapular Strengthening: red band, extension, retraction, rows, x10 -UBE: level 1, 2.5' forwards and backwards, pace: 3.0+   PATIENT EDUCATION: Education details: continue HEP Person educated: Patient and Child(ren) Education method: Explanation, Demonstration, and Handouts Education comprehension: verbalized understanding and returned demonstration  HOME EXERCISE PROGRAM: 2/6: Pendulums and Table Slides 2/10: AA/ROM, Isometrics 2/14: A/ROM  2/21: Scapular strengthening 2/24: Theraball Exercises  GOALS: Goals reviewed with patient? Yes   SHORT TERM GOALS: Target date: 01/02/24  Pt will be provided with and educated on HEP to improve mobility in RUE required for use during ADL completion.   Goal status:  IN PROGRESS  Pt will decrease pain in RUE to 3/10 or less to improve ability to sleep for 2+ consecutive hours without waking due to pain.   Goal status: IN PROGRESS  2.  Pt will decrease RUE fascial restrictions to min amounts or less to improve mobility required for functional reaching tasks.   Goal status: IN PROGRESS  3.  Pt will increase RUE A/ROM by 20 degrees to improve ability to use RUE when reaching overhead or behind back during dressing and bathing tasks.   Goal status: IN PROGRESS  4.  Pt will increase RUE strength to 4+/5 or greater to improve ability to use RUE when lifting or carrying items during meal preparation/housework/yardwork tasks.   Goal status: IN PROGRESS  5.  Pt will return to highest level of function using RUE as dominant during functional task completion.   Goal status: IN PROGRESS   ASSESSMENT:  CLINICAL IMPRESSION: This session pt continuing to demonstrate improving ROM and strength. She was able to tolerate functional reaching with 1 and 2lb weights with minimal  discomfort this session. OT also added theraball exercises for continued strengthening with a light weight basketball. Verbal and tactile cuing provided for positioning and technique throughout the session.   PERFORMANCE DEFICITS: in functional skills including in functional skills including ADLs, IADLs, coordination, tone, ROM, strength, pain, fascial restrictions, muscle spasms, and UE functional use, cognitive skills including emotional, memory, and safety awareness, and psychosocial skills including coping strategies, interpersonal interactions, and routines and behaviors.    PLAN:  OT FREQUENCY: 2x/week  OT DURATION: 4 weeks  PLANNED INTERVENTIONS: 97168 OT Re-evaluation, 97535 self care/ADL training, 46962 therapeutic exercise, 97530 therapeutic activity, 97112 neuromuscular re-education, 97140 manual therapy, 97035 ultrasound, 97010 moist heat, 97032 electrical stimulation (manual), passive range of motion, functional mobility training, energy conservation, coping strategies training, patient/family education, and DME and/or AE instructions  RECOMMENDED OTHER SERVICES: Orthopedic MD referral  CONSULTED AND AGREED WITH PLAN OF CARE: Patient and family member/caregiver  PLAN FOR NEXT SESSION: Manual Therapy, AA/ROM, A/ROM, Proximal shoulder exercises   Trish Mage, OTR/L [facility] [phone] Exie Chrismer Rosemarie Beath, OT 12/22/2023, 1:29 PM

## 2023-12-23 ENCOUNTER — Ambulatory Visit (HOSPITAL_COMMUNITY): Payer: 59 | Admitting: Physical Therapy

## 2023-12-23 DIAGNOSIS — R29898 Other symptoms and signs involving the musculoskeletal system: Secondary | ICD-10-CM

## 2023-12-23 DIAGNOSIS — R262 Difficulty in walking, not elsewhere classified: Secondary | ICD-10-CM

## 2023-12-23 DIAGNOSIS — M25611 Stiffness of right shoulder, not elsewhere classified: Secondary | ICD-10-CM | POA: Diagnosis not present

## 2023-12-23 DIAGNOSIS — R2681 Unsteadiness on feet: Secondary | ICD-10-CM

## 2023-12-23 DIAGNOSIS — M25511 Pain in right shoulder: Secondary | ICD-10-CM | POA: Diagnosis not present

## 2023-12-23 NOTE — Therapy (Signed)
 OUTPATIENT PHYSICAL THERAPY LOWER EXTREMITY TREATMENT   Patient Name: Sheryl [name] MRN: [id number] DOB:[birth year], 68 y.o., female Today's Date: 12/23/2023  END OF SESSION:  PT End of Session - 12/23/23 0801     Visit Number 4    Number of Visits 8    Date for PT Re-Evaluation 12/26/23    Authorization Type [employer] Medicare    Authorization Time Period please check auth    PT Start Time 0802    PT Stop Time 0845    PT Time Calculation (min) 43 min    Activity Tolerance Patient tolerated treatment well    Behavior During Therapy [facility] for tasks assessed/performed;Anxious              Past Medical History:  Diagnosis Date   Chronic back pain 10/22/2011   Chronic neck pain 10/22/2011   Depression    HLD (hyperlipidemia)    Hypertension    Past Surgical History:  Procedure Laterality Date   BREAST CYST EXCISION     right   COLONOSCOPY     per patient, many years ago at [facility]   COLONOSCOPY WITH PROPOFOL N/A 11/16/2021   Procedure: COLONOSCOPY WITH PROPOFOL;  Surgeon: Corbin Ade, MD;  [facility];  Service: Endoscopy;  Laterality: N/A;  9:15am   POLYPECTOMY  11/16/2021   Procedure: POLYPECTOMY;  Surgeon: Corbin Ade, MD;  [facility];  Service: Endoscopy;;   TUBAL LIGATION     Patient Active Problem List   Diagnosis Date Noted   History of wheezing 11/15/2021   Dental abscess 11/15/2021   Colon cancer screening 09/03/2021   Pain, dental 10/12/2020   Hyperlipemia 09/07/2020   Borderline diabetes 09/06/2020   Bullous impetigo 07/26/2015   Mixed incontinence urge and stress 07/25/2015   Rotator cuff syndrome of left shoulder 12/19/2014   Difficulty walking 05/06/2012   Stiffness of vertebral column 05/06/2012   Decreased strength 05/06/2012   Class 3 obesity 04/16/2012   MDD (major depressive disorder) 04/16/2012   Essential hypertension, benign 04/16/2012   Back pain 04/16/2012   Cervical strain, acute 04/16/2012    PCP:  Renaye Rakers, MD  REFERRING PROVIDER: Renaye Rakers, MD REFERRING DIAG: R26.81 (ICD-10-CM) - Unsteady gait  THERAPY DIAG:  Unsteady gait  Difficulty in walking, not elsewhere classified  Other symptoms and signs involving the musculoskeletal system  Rationale for Evaluation and Treatment: Rehabilitation  ONSET DATE: 1 year  SUBJECTIVE:   SUBJECTIVE STATEMENT: 12/23/23: Pt reports only her Rt UE is hurting today but otherwise doing well.   Eval:  Patient reports she has a hard time walking/ keeping her balance sometimes; it's not all the time.  She has to furniture walk in her home sometimes to keep her balance.  Patient seems occasionally confused during subjective today. She states she sometimes walks sideways; seems unsure of any cause of her unbalance.  States she is borderline DM, She does take blood pressure medication.  Some right side shoulder and leg pain.  Daughters or sisters usually come to her appts with her.  She becomes emotional during interview this morning. Right handed; having trouble with controlling right arm BP left arm 150/96 has not taken her BP meds this morning; daughter arrives at appt. And states her daughters are concerned about her cognition and right side weakness, complaints of pain  PERTINENT HISTORY: Significant depression per her daughter  PAIN:  Are you having pain? Yes: NPRS scale: 8/10 right shoulder, 0-10/10 right leg Pain  location: right shoulder and right leg Pain description: tight,  throbbing, aching Aggravating factors: leg worse at night, movement right arm, weaker Relieving factors: massage, muscle rub, BC powder  PRECAUTIONS: Fall     WEIGHT BEARING RESTRICTIONS: No  FALLS:  Has patient fallen in last 6 months? No  LIVING ENVIRONMENT: Lives with: lives with an adult companion Lives in: House/apartment Stairs: Yes: External: 5 steps; on right going up, on left going up, and bilateral but cannot reach both Has following equipment  at home: Single point cane and Quad cane small base  OCCUPATION: not working  PLOF: Independent  PATIENT GOALS: walk without worrying about falling  NEXT MD VISIT: 11/28/23  OBJECTIVE:  Note: Objective measures were completed at Evaluation unless otherwise noted.  DIAGNOSTIC FINDINGS: none  PATIENT SURVEYS:  LEFS 38/80 47.5%  COGNITION: Overall cognitive status:  some difficulty answering more than yes/no questions; visibly frustrated and starts crying during subjective; improves when her daughter [name] arrives who states her family is concerned about her cognition      SENSATION: Appears intact at eval  EDEMA:  None noted  POSTURE: rounded shoulders, forward head, and heavy chested  PALPATION: Not specifically tested today  LOWER EXTREMITY ROM:  Active ROM Right eval Left eval  Hip flexion    Hip extension    Hip abduction    Hip adduction    Hip internal rotation    Hip external rotation    Knee flexion    Knee extension    Ankle dorsiflexion    Ankle plantarflexion    Ankle inversion    Ankle eversion     (Blank rows = not tested)  LOWER EXTREMITY MMT:  MMT Right eval Left eval  Hip flexion 4 4-  Hip extension 3- 3-  Hip abduction    Hip adduction    Hip internal rotation    Hip external rotation    Knee flexion 4 4-  Knee extension 4+ 4+  Ankle dorsiflexion 4- 5  Ankle plantarflexion    Ankle inversion    Ankle eversion     (Blank rows = not tested)    FUNCTIONAL TESTS:  5 times sit to stand: 27.97 sec using hands to assist up to standin Timed up and go (TUG): 16.29  no AD SLS 3" each leg  GAIT: Distance walked: 50 ft in clinic Assistive device utilized: None Level of assistance: SBA Comments: touches wall as she walks; sometimes tends to walk with small base of support                                                                                                                                TREATMENT DATE:  12/23/23 Standing:   heelraises on incline 20X  Hip abduction 2x10  Hip extension 2X10  Toe taps on 6" box 20X no UE support  Tandem stance 1' each LE with intermittent HHA  Vector stance 5X3" each with  1 UE assist  Lunges on 4" no UE assist 2X10 each Sitting:  sit to stands feet on blue foam no UE 10X    12/16/2023  -Tandem stance with fingers hovering over // bars 3x1' -Sit/stand transfers from elevated chair with blue foam under feet for balance challenges with GTB around knees 2 x 5 with cues for anterior weight shift for anticipation for movement patterns.  -Side stepping in // bars with GTB 2 x 1' -Standing heel raises on decline 2 x 10 -Seated toe raises x 20 -Standing toe taps on 7in box x 20 with no UE support- LOB x 1 laterally with min assist to correct  12/12/23: Reviewed goals Educated importance of HEP compliance for maximal benefits  Supine: Bridge 2x 10 5" holds  Seated: STS no HHA eccentric control 10x Adduction squeeze 10x 5"  Standing: SLS Lt 7", Rt 8" Abduction with HHA 10x 3" March 10x 3" alternating Heel raise 10x  Tandem stance 1x 20"   DGI 1. Gait level surface (2) Mild Impairment: Walks 20', uses assistive devices, slower speed, mild gait deviations. 2. Change in gait speed (1) Moderate Impairment: Makes only minor adjustments to walking speed, or accomplishes a change in speed with significant gait deviations, or changes speed but has significant gait deviations, or changes speed but loses balance but is able to recover and continue walking. 3. Gait with horizontal head turns (1) Moderate Impairment: Performs head turns with moderate change in gait velocity, slows down, staggers but recovers, can continue to walk. 4. Gait with vertical head turns 1) Moderate Impairment: Performs head turns with moderate change in gait velocity, slows down, staggers but recovers, can continue to walk. 5. Gait and pivot turn (2) Mild Impairment: Pivot turns safely in > 3 seconds and  stops with no loss of balance. 6. Step over obstacle (2) Mild Impairment: Is able to step over box, but must slow down and adjust steps to clear box safely. 7. Step around obstacles (2) Mild Impairment: Is able to step around both cones, but must slow down and adjust steps to clear cones. 8. Stairs (1) Moderate Impairment: Two feet to a stair, must use rail.  TOTAL SCORE: 12 / 24   11/27/23 physical therapy evaluation and HEP instruction    PATIENT EDUCATION:  Education details: Patient educated on exam findings, POC, scope of PT, HEP, and benefit of using a cane for ambulation outside of home. Person educated: Patient Education method: Explanation, Demonstration, and Handouts Education comprehension: verbalized understanding, returned demonstration, verbal cues required, and tactile cues required  HOME EXERCISE PROGRAM: Access Code: [email or web address] URL: [email or web address] Date: 11/27/2023 Prepared by: [facility]  Exercises - Sit to Stand with Armchair  - 2 x daily - 7 x weekly - 1 sets - 10 reps - standing single leg balance at the counter (try to not hold on)  - 2 x daily - 7 x weekly - 1 sets - 3 reps - 10 sec hold  12/12/23:  Bridge 10x 2 set  ASSESSMENT:  CLINICAL IMPRESSION: Continued with focus on improving LE strength and stability.  Pt exhibits very slow processing needing 1 step instructions as well as demonstration.  No LOB today with activities and longer hold completed with tandem without UE assist. Pt was tearful at times due to frustration with comprehending/executing activities.  No new activities added to HEP today.  Did begin forward step exercises and encouragement of completion without UE assist when able.  Pt will continue to  benefit from skilled Physical Therapy services to address deficits/limitations in order to improve functional and QOL.   OBJECTIVE IMPAIRMENTS: Abnormal gait, decreased balance, decreased cognition, decreased knowledge of  condition, decreased knowledge of use of DME, difficulty walking, decreased strength, increased fascial restrictions, impaired perceived functional ability, and pain.   ACTIVITY LIMITATIONS: bending, standing, squatting, sleeping, stairs, transfers, and locomotion level  PARTICIPATION LIMITATIONS: meal prep, cleaning, laundry, driving, shopping, and community activity  PERSONAL FACTORS: 1 comorbidity: depression  are also affecting patient's functional outcome.   REHAB POTENTIAL: Good  CLINICAL DECISION MAKING: Evolving/moderate complexity  EVALUATION COMPLEXITY: Moderate   GOALS: Goals reviewed with patient? No  SHORT TERM GOALS: Target date: 12/11/2023 patient will be independent with initial HEP  Baseline: Goal status: INITIAL  2.  Patient will improve SLS to 5" each leg to demonstrate improved functional balance Baseline:  Goal status: INITIAL  LONG TERM GOALS: Target date: 3 /03/2024  Patient will be independent in self management strategies to improve quality of life and functional outcomes.  Baseline:  Goal status: INITIAL  2.  Patient will report 50% improvement overall  Baseline:  Goal status: INITIAL  3.  Patient will improve SLS to 10" each to demonstrate improved functional balance Baseline: 3" each Goal status: INITIAL  4.  Patient will increase  bilateral leg MMT's to 4+ to 5/5 to allow navigation of steps without gait deviation or loss of balance  Baseline: see above Goal status: INITIAL  5.  Patient will improve her TUG to 10" or less to demonstrate improved functional mobility and decreased fall risk Baseline: 16.29 sec Goal status: INITIAL  6.  Patient will improve 5 times sit to stand score to 15 sec or less to demonstrate improved functional mobility and increased leg strength.    Baseline: 27.97 Goal status: INITIAL   PLAN:  PT FREQUENCY: 2x/week  PT DURATION: 4 weeks  PLANNED INTERVENTIONS: 97164- PT Re-evaluation,  97110-Therapeutic exercises, 97530- Therapeutic activity, 97112- Neuromuscular re-education, 97535- Self Care, 16109- Manual therapy, [phone]- Gait training, [phone]- Orthotic Fit/training, [phone]- Canalith repositioning, [id number]- Aquatic Therapy, [phone]- Splinting, Patient/Family education, Balance training, Stair training, Taping, Dry Needling, Joint mobilization, Joint manipulation, Spinal manipulation, Spinal mobilization, Scar mobilization, and DME instructions.   PLAN FOR NEXT SESSION:  Requires demonstration, one step commands.  Easily frustrated and emotional at times. Progress balance training, strength training of legs; progress HEP; seeing OT for right shoulder.   [name], PTA/CLT [facility] Ph: [phone]  9:34 AM, 12/23/23

## 2023-12-24 ENCOUNTER — Ambulatory Visit (HOSPITAL_COMMUNITY): Payer: 59 | Admitting: Occupational Therapy

## 2023-12-24 ENCOUNTER — Encounter (HOSPITAL_COMMUNITY): Payer: Self-pay | Admitting: Occupational Therapy

## 2023-12-24 DIAGNOSIS — M25511 Pain in right shoulder: Secondary | ICD-10-CM | POA: Diagnosis not present

## 2023-12-24 DIAGNOSIS — R2681 Unsteadiness on feet: Secondary | ICD-10-CM | POA: Diagnosis not present

## 2023-12-24 DIAGNOSIS — M25611 Stiffness of right shoulder, not elsewhere classified: Secondary | ICD-10-CM

## 2023-12-24 DIAGNOSIS — R262 Difficulty in walking, not elsewhere classified: Secondary | ICD-10-CM | POA: Diagnosis not present

## 2023-12-24 DIAGNOSIS — R29898 Other symptoms and signs involving the musculoskeletal system: Secondary | ICD-10-CM | POA: Diagnosis not present

## 2023-12-24 NOTE — Therapy (Signed)
 OUTPATIENT OCCUPATIONAL THERAPY ORTHO TREATMENT NOTE  Patient Name: Sheryl [name] MRN: [id number] DOB:1956-01-23, 68 y.o., female Today's Date: 12/24/2023   END OF SESSION:  OT End of Session - 12/24/23 0848     Visit Number 7    Number of Visits 8    Date for OT Re-Evaluation 01/02/24    Authorization Type U[employer] Dual Complete    OT Start Time 0807    OT Stop Time 0848    OT Time Calculation (min) 41 min    Activity Tolerance Patient tolerated treatment well    Behavior During Therapy [facility] for tasks assessed/performed;Anxious               Past Medical History:  Diagnosis Date   Chronic back pain 10/22/2011   Chronic neck pain 10/22/2011   Depression    HLD (hyperlipidemia)    Hypertension    Past Surgical History:  Procedure Laterality Date   BREAST CYST EXCISION     right   COLONOSCOPY     per patient, many years ago at [facility]   COLONOSCOPY WITH PROPOFOL N/A 11/16/2021   Procedure: COLONOSCOPY WITH PROPOFOL;  Surgeon: Corbin Ade, MD;  Location: [facility];  Service: Endoscopy;  Laterality: N/A;  9:15am   POLYPECTOMY  11/16/2021   Procedure: POLYPECTOMY;  Surgeon: Corbin Ade, MD;  Location: [facility];  Service: Endoscopy;;   TUBAL LIGATION     Patient Active Problem List   Diagnosis Date Noted   History of wheezing 11/15/2021   Dental abscess 11/15/2021   Colon cancer screening 09/03/2021   Pain, dental 10/12/2020   Hyperlipemia 09/07/2020   Borderline diabetes 09/06/2020   Bullous impetigo 07/26/2015   Mixed incontinence urge and stress 07/25/2015   Rotator cuff syndrome of left shoulder 12/19/2014   Difficulty walking 05/06/2012   Stiffness of vertebral column 05/06/2012   Decreased strength 05/06/2012   Class 3 obesity 04/16/2012   MDD (major depressive disorder) 04/16/2012   Essential hypertension, benign 04/16/2012   Back pain 04/16/2012   Cervical strain, acute 04/16/2012    PCP: Renaye Rakers, MD REFERRING PROVIDER:  Renaye Rakers, MD  ONSET DATE: ~9 months  REFERRING DIAG: M79.601 (ICD-10-CM) - Right arm pain   THERAPY DIAG:  Right shoulder pain, unspecified chronicity  Shoulder stiffness, right  Other symptoms and signs involving the musculoskeletal system  Rationale for Evaluation and Treatment: Rehabilitation  SUBJECTIVE:   SUBJECTIVE STATEMENT: "I'm feeling better today" Pt accompanied by: self and family member  PERTINENT HISTORY: Pt reports that she has been having difficulty with controlling and mobilizing her RUE "for a while" and reports that pain has been significantly increasing. There has been no referral to an Orthopedic at this time. Pt also presenting with balance deficits and periods of confusion/difficulty answering questions, where she becomes frustrated and starts to tear up.   PRECAUTIONS: None  WEIGHT BEARING RESTRICTIONS: No  PAIN:  Are you having pain? Yes: NPRS scale: 1/10 Pain location: all over RUE Pain description: aching, sharp Aggravating factors: movement Relieving factors: unsure  FALLS: Has patient fallen in last 6 months? No  PLOF: Independent  PATIENT GOALS: "To reduce pain"  NEXT MD VISIT: 11/28/23  OBJECTIVE:   HAND DOMINANCE: Right  ADLs: Overall ADLs: Pain limits her tolerance to completing ADL's and IADL's efficiently. She uses her LUE more, as well as compensatory strategies.  FUNCTIONAL OUTCOME MEASURES: Quick Dash: 45.45  UPPER EXTREMITY ROM:  Assessed in seated, er/IR adducted  Active ROM Right eval  Shoulder flexion 141  Shoulder abduction 139  Shoulder internal rotation 90  Shoulder external rotation 9  (Blank rows = not tested)    UPPER EXTREMITY MMT:     Assessed in seated, er/IR adducted  MMT Right eval  Shoulder flexion 4-/5  Shoulder abduction 4/5  Shoulder internal rotation 4+/5  Shoulder external rotation 4/5 (w/ pain)  (Blank rows = not tested)  HAND FUNCTION: Grip strength: Right: 29 lbs; Left:  29 lbs  SENSATION: WFL  EDEMA: Pt reports sometimes the upper arm feels swollen.  COGNITION: Overall cognitive status:  Pt emotional with questions, requiring increased time to answer and at times appears to have difficulty understanding the questions.  Areas of impairment: Awareness: Deficits Poor awareness of all deficits Behavior: Poor frustration tolerance and Lability  OBSERVATIONS: Moderate fascial restrictions along deltoid, trapezius, and biceps.    TODAY'S TREATMENT:                                                                                                                              DATE:   12/24/23 -Pulleys: flexion, abduction, x60" -A/ROM: seated, flexion, abduction, horizontal abduction, protraction, er/IR, x12 -X to V arms, x12 -Goal Post Arms, x12 -Functional reaching: 2# wrist weight, 10 cones, from counter to 1st to 2nd shelf flexion, repeat in abduction -PNF Strengthening: Red band, chest pulls, overhead pulls, PNF up, PNF down, er pulls, x10  12/22/23 -Manual therapy: Myofascial release and trigger point applied to biceps, trapezius, deltoid, and scapular region in order to reduce pain and fascial restriction, as well as improve ROM.  -A/ROM: seated, flexion, abduction, Protraction, horizontal abduction, er/IR, x12 -X to V arms x12 -Goal post arms x12 -Proximal Shoulder Exercises: paddles, criss cross, circles both directions, x10 -Scapular Strengthening: red band, extension, retraction, rows, x15 -Shoulder Strengthening: red band, horizontal abduction, er, IR, x15  12/15/23 -Manual therapy: Myofascial release and trigger point applied to biceps, trapezius, deltoid, and scapular region in order to reduce pain and fascial restriction, as well as improve ROM.  -A/ROM: supine, flexion, abduction, Protraction, horizontal abduction, er/IR, x12 -Proximal Shoulder Exercises: paddles, criss cross, circles both directions, x10 -Functional Reaching: 1# 1st and 2nd  shelf x10, 2# 1st shelf x10 -Theraball Exercises: flexion, overhead press, protraction, V ups, circles both directions, x10 -UBE: level 2,  2.5' forwards and backwards, pace: 3.0+   PATIENT EDUCATION: Education details: PNF Strengthening Person educated: Patient and Child(ren) Education method: Explanation, Demonstration, and Handouts Education comprehension: verbalized understanding and returned demonstration  HOME EXERCISE PROGRAM: 2/6: Pendulums and Table Slides 2/10: AA/ROM, Isometrics 2/14: A/ROM  2/21: Scapular strengthening 2/24: Theraball Exercises 3/5: PNF Strengthening  GOALS: Goals reviewed with patient? Yes   SHORT TERM GOALS: Target date: 01/02/24  Pt will be provided with and educated on HEP to improve mobility in RUE required for use during ADL completion.   Goal status: IN PROGRESS  Pt  will decrease pain in RUE to 3/10 or less to improve ability to sleep for 2+ consecutive hours without waking due to pain.   Goal status: IN PROGRESS  2.  Pt will decrease RUE fascial restrictions to min amounts or less to improve mobility required for functional reaching tasks.   Goal status: IN PROGRESS  3.  Pt will increase RUE A/ROM by 20 degrees to improve ability to use RUE when reaching overhead or behind back during dressing and bathing tasks.   Goal status: IN PROGRESS  4.  Pt will increase RUE strength to 4+/5 or greater to improve ability to use RUE when lifting or carrying items during meal preparation/housework/yardwork tasks.   Goal status: IN PROGRESS  5.  Pt will return to highest level of function using RUE as dominant during functional task completion.   Goal status: IN PROGRESS   ASSESSMENT:  CLINICAL IMPRESSION: This session pt continuing to work on strengthening and improving overall activity tolerance. She continues to have good ROM and movement pattern un-resisted. When adding weight and resistance, she demonstrates mild tremors from effort and  quicker fatigue, requiring frequent short rest breaks. OT providing verbal and visual cuing for positioning and technique throughout session.   PERFORMANCE DEFICITS: in functional skills including in functional skills including ADLs, IADLs, coordination, tone, ROM, strength, pain, fascial restrictions, muscle spasms, and UE functional use, cognitive skills including emotional, memory, and safety awareness, and psychosocial skills including coping strategies, interpersonal interactions, and routines and behaviors.    PLAN:  OT FREQUENCY: 2x/week  OT DURATION: 4 weeks  PLANNED INTERVENTIONS: 97168 OT Re-evaluation, 97535 self care/ADL training, 81191 therapeutic exercise, 97530 therapeutic activity, 97112 neuromuscular re-education, 97140 manual therapy, 97035 ultrasound, 97010 moist heat, 97032 electrical stimulation (manual), passive range of motion, functional mobility training, energy conservation, coping strategies training, patient/family education, and DME and/or AE instructions  RECOMMENDED OTHER SERVICES: Orthopedic MD referral  CONSULTED AND AGREED WITH PLAN OF CARE: Patient and family member/caregiver  PLAN FOR NEXT SESSION: Manual Therapy, AA/ROM, A/ROM, Proximal shoulder exercises   Trish Mage, OTR/L [facility] [phone] Amanee Iacovelli Rosemarie Beath, OT 12/24/2023, 2:50 PM

## 2023-12-24 NOTE — Patient Instructions (Signed)

## 2023-12-25 ENCOUNTER — Ambulatory Visit (HOSPITAL_COMMUNITY): Payer: 59 | Admitting: Physical Therapy

## 2023-12-25 DIAGNOSIS — R2681 Unsteadiness on feet: Secondary | ICD-10-CM | POA: Diagnosis not present

## 2023-12-25 DIAGNOSIS — M25611 Stiffness of right shoulder, not elsewhere classified: Secondary | ICD-10-CM | POA: Diagnosis not present

## 2023-12-25 DIAGNOSIS — R262 Difficulty in walking, not elsewhere classified: Secondary | ICD-10-CM | POA: Diagnosis not present

## 2023-12-25 DIAGNOSIS — R29898 Other symptoms and signs involving the musculoskeletal system: Secondary | ICD-10-CM

## 2023-12-25 DIAGNOSIS — M25511 Pain in right shoulder: Secondary | ICD-10-CM | POA: Diagnosis not present

## 2023-12-25 NOTE — Therapy (Signed)
 OUTPATIENT PHYSICAL THERAPY LOWER EXTREMITY TREATMENT   Patient Name: Sheryl [name] MRN: [id number] DOB:Jul 26, 1956, 68 y.o., female Today's Date: 12/25/2023  END OF SESSION:  PT End of Session - 12/25/23 1118     Visit Number 5    Number of Visits 8    Date for PT Re-Evaluation 12/26/23    Authorization Type U[employer] Medicare    Authorization Time Period please check auth    PT Start Time 0805    PT Stop Time 0850    PT Time Calculation (min) 45 min    Activity Tolerance Patient tolerated treatment well    Behavior During Therapy [facility] for tasks assessed/performed;Anxious               Past Medical History:  Diagnosis Date   Chronic back pain 10/22/2011   Chronic neck pain 10/22/2011   Depression    HLD (hyperlipidemia)    Hypertension    Past Surgical History:  Procedure Laterality Date   BREAST CYST EXCISION     right   COLONOSCOPY     per patient, many years ago at [facility]   COLONOSCOPY WITH PROPOFOL N/A 11/16/2021   Procedure: COLONOSCOPY WITH PROPOFOL;  Surgeon: Corbin Ade, MD;  [facility];  Service: Endoscopy;  Laterality: N/A;  9:15am   POLYPECTOMY  11/16/2021   Procedure: POLYPECTOMY;  Surgeon: Corbin Ade, MD;  [facility];  Service: Endoscopy;;   TUBAL LIGATION     Patient Active Problem List   Diagnosis Date Noted   History of wheezing 11/15/2021   Dental abscess 11/15/2021   Colon cancer screening 09/03/2021   Pain, dental 10/12/2020   Hyperlipemia 09/07/2020   Borderline diabetes 09/06/2020   Bullous impetigo 07/26/2015   Mixed incontinence urge and stress 07/25/2015   Rotator cuff syndrome of left shoulder 12/19/2014   Difficulty walking 05/06/2012   Stiffness of vertebral column 05/06/2012   Decreased strength 05/06/2012   Class 3 obesity 04/16/2012   MDD (major depressive disorder) 04/16/2012   Essential hypertension, benign 04/16/2012   Back pain 04/16/2012   Cervical strain, acute 04/16/2012    PCP:  Renaye Rakers, MD  REFERRING PROVIDER: Renaye Rakers, MD REFERRING DIAG: R26.81 (ICD-10-CM) - Unsteady gait  THERAPY DIAG:  Other symptoms and signs involving the musculoskeletal system  Unsteady gait  Difficulty in walking, not elsewhere classified  Rationale for Evaluation and Treatment: Rehabilitation  ONSET DATE: 1 year  SUBJECTIVE:   SUBJECTIVE STATEMENT: 12/25/23: Pt reports she's tired today but going to try her best.  No complaints of pain or other issues.   Eval:  Patient reports she has a hard time walking/ keeping her balance sometimes; it's not all the time.  She has to furniture walk in her home sometimes to keep her balance.  Patient seems occasionally confused during subjective today. She states she sometimes walks sideways; seems unsure of any cause of her unbalance.  States she is borderline DM, She does take blood pressure medication.  Some right side shoulder and leg pain.  Daughters or sisters usually come to her appts with her.  She becomes emotional during interview this morning. Right handed; having trouble with controlling right arm BP left arm 150/96 has not taken her BP meds this morning; daughter arrives at appt. And states her daughters are concerned about her cognition and right side weakness, complaints of pain  PERTINENT HISTORY: Significant depression per her daughter  PAIN:  Are you having pain? Yes: NPRS scale:  8/10 right shoulder, 0-10/10 right leg Pain location: right shoulder and right leg Pain description: tight,  throbbing, aching Aggravating factors: leg worse at night, movement right arm, weaker Relieving factors: massage, muscle rub, BC powder  PRECAUTIONS: Fall     WEIGHT BEARING RESTRICTIONS: No  FALLS:  Has patient fallen in last 6 months? No  LIVING ENVIRONMENT: Lives with: lives with an adult companion Lives in: House/apartment Stairs: Yes: External: 5 steps; on right going up, on left going up, and bilateral but cannot reach  both Has following equipment at home: Single point cane and Quad cane small base  OCCUPATION: not working  PLOF: Independent  PATIENT GOALS: walk without worrying about falling  NEXT MD VISIT: 11/28/23  OBJECTIVE:  Note: Objective measures were completed at Evaluation unless otherwise noted.  DIAGNOSTIC FINDINGS: none  PATIENT SURVEYS:  LEFS 38/80 47.5%  COGNITION: Overall cognitive status:  some difficulty answering more than yes/no questions; visibly frustrated and starts crying during subjective; improves when her daughter [name] arrives who states her family is concerned about her cognition      SENSATION: Appears intact at eval  EDEMA:  None noted  POSTURE: rounded shoulders, forward head, and heavy chested  PALPATION: Not specifically tested today  LOWER EXTREMITY ROM:  Active ROM Right eval Left eval  Hip flexion    Hip extension    Hip abduction    Hip adduction    Hip internal rotation    Hip external rotation    Knee flexion    Knee extension    Ankle dorsiflexion    Ankle plantarflexion    Ankle inversion    Ankle eversion     (Blank rows = not tested)  LOWER EXTREMITY MMT:  MMT Right eval Left eval  Hip flexion 4 4-  Hip extension 3- 3-  Hip abduction    Hip adduction    Hip internal rotation    Hip external rotation    Knee flexion 4 4-  Knee extension 4+ 4+  Ankle dorsiflexion 4- 5  Ankle plantarflexion    Ankle inversion    Ankle eversion     (Blank rows = not tested)    FUNCTIONAL TESTS:  5 times sit to stand: 27.97 sec using hands to assist up to standin Timed up and go (TUG): 16.29  no AD SLS 3" each leg  GAIT: Distance walked: 50 ft in clinic Assistive device utilized: None Level of assistance: SBA Comments: touches wall as she walks; sometimes tends to walk with small base of support                                                                                                                                 TREATMENT DATE:  12/25/23 Standing:  heelraises on incline 20X  Hip abduction 2x10  Hip extension 2X10  Toe taps on 6" box 20X no UE support  Tandem stance 30"X2 each LE with intermittent  HHA  Vector stance 5X3" each with 1 UE assist  Lunges on 4" no UE assist 2X10 each  Forward step ups 4" 10X each with 1 UE assist  Lateral step ups with eccentric lowering 4" 10X each with 1 UE assist Sitting:  sit to stands feet on blue foam no UE 10X  12/23/23 Standing:  heelraises on incline 20X  Hip abduction 2x10  Hip extension 2X10  Toe taps on 6" box 20X no UE support  Tandem stance 1' each LE with intermittent HHA  Vector stance 5X3" each with 1 UE assist  Lunges on 4" no UE assist 2X10 each Sitting:  sit to stands feet on blue foam no UE 10X    12/16/2023  -Tandem stance with fingers hovering over // bars 3x1' -Sit/stand transfers from elevated chair with blue foam under feet for balance challenges with GTB around knees 2 x 5 with cues for anterior weight shift for anticipation for movement patterns.  -Side stepping in // bars with GTB 2 x 1' -Standing heel raises on decline 2 x 10 -Seated toe raises x 20 -Standing toe taps on 7in box x 20 with no UE support- LOB x 1 laterally with min assist to correct  12/12/23: Reviewed goals Educated importance of HEP compliance for maximal benefits  Supine: Bridge 2x 10 5" holds  Seated: STS no HHA eccentric control 10x Adduction squeeze 10x 5"  Standing: SLS Lt 7", Rt 8" Abduction with HHA 10x 3" March 10x 3" alternating Heel raise 10x  Tandem stance 1x 20"   DGI 1. Gait level surface (2) Mild Impairment: Walks 20', uses assistive devices, slower speed, mild gait deviations. 2. Change in gait speed (1) Moderate Impairment: Makes only minor adjustments to walking speed, or accomplishes a change in speed with significant gait deviations, or changes speed but has significant gait deviations, or changes speed but loses balance but is  able to recover and continue walking. 3. Gait with horizontal head turns (1) Moderate Impairment: Performs head turns with moderate change in gait velocity, slows down, staggers but recovers, can continue to walk. 4. Gait with vertical head turns 1) Moderate Impairment: Performs head turns with moderate change in gait velocity, slows down, staggers but recovers, can continue to walk. 5. Gait and pivot turn (2) Mild Impairment: Pivot turns safely in > 3 seconds and stops with no loss of balance. 6. Step over obstacle (2) Mild Impairment: Is able to step over box, but must slow down and adjust steps to clear box safely. 7. Step around obstacles (2) Mild Impairment: Is able to step around both cones, but must slow down and adjust steps to clear cones. 8. Stairs (1) Moderate Impairment: Two feet to a stair, must use rail.  TOTAL SCORE: 12 / 24   11/27/23 physical therapy evaluation and HEP instruction    PATIENT EDUCATION:  Education details: Patient educated on exam findings, POC, scope of PT, HEP, and benefit of using a cane for ambulation outside of home. Person educated: Patient Education method: Explanation, Demonstration, and Handouts Education comprehension: verbalized understanding, returned demonstration, verbal cues required, and tactile cues required  HOME EXERCISE PROGRAM: Access Code: [email or web address] URL: [email or web address] Date: 11/27/2023 Prepared by: [facility]  Exercises - Sit to Stand with Armchair  - 2 x daily - 7 x weekly - 1 sets - 10 reps - standing single leg balance at the counter (try to not hold on)  - 2 x daily - 7 x weekly - 1  sets - 3 reps - 10 sec hold  12/12/23:  Bridge 10x 2 set  ASSESSMENT:  CLINICAL IMPRESSION: Continued with focus on improving LE strength and stability.  Pt with increased ease and less frustration going through program now that she has some familiarity.  Did have one episode of crying with tandem stance activity possibly  due to challenge, however done well and able to maintain with intermittent HHA. Only LOB today was with tandem but able to self correct inside parallel bars.  Completed forward and lateral step ups to work on eccentric strengthening bilaterally.  No new activities added to HEP today.   Pt will continue to benefit from skilled Physical Therapy services to address deficits/limitations in order to improve functional and QOL.   OBJECTIVE IMPAIRMENTS: Abnormal gait, decreased balance, decreased cognition, decreased knowledge of condition, decreased knowledge of use of DME, difficulty walking, decreased strength, increased fascial restrictions, impaired perceived functional ability, and pain.   ACTIVITY LIMITATIONS: bending, standing, squatting, sleeping, stairs, transfers, and locomotion level  PARTICIPATION LIMITATIONS: meal prep, cleaning, laundry, driving, shopping, and community activity  PERSONAL FACTORS: 1 comorbidity: depression  are also affecting patient's functional outcome.   REHAB POTENTIAL: Good  CLINICAL DECISION MAKING: Evolving/moderate complexity  EVALUATION COMPLEXITY: Moderate   GOALS: Goals reviewed with patient? No  SHORT TERM GOALS: Target date: 12/11/2023 patient will be independent with initial HEP  Baseline: Goal status: INITIAL  2.  Patient will improve SLS to 5" each leg to demonstrate improved functional balance Baseline:  Goal status: INITIAL  LONG TERM GOALS: Target date: 3 /03/2024  Patient will be independent in self management strategies to improve quality of life and functional outcomes.  Baseline:  Goal status: INITIAL  2.  Patient will report 50% improvement overall  Baseline:  Goal status: INITIAL  3.  Patient will improve SLS to 10" each to demonstrate improved functional balance Baseline: 3" each Goal status: INITIAL  4.  Patient will increase  bilateral leg MMT's to 4+ to 5/5 to allow navigation of steps without gait deviation or loss  of balance  Baseline: see above Goal status: INITIAL  5.  Patient will improve her TUG to 10" or less to demonstrate improved functional mobility and decreased fall risk Baseline: 16.29 sec Goal status: INITIAL  6.  Patient will improve 5 times sit to stand score to 15 sec or less to demonstrate improved functional mobility and increased leg strength.    Baseline: 27.97 Goal status: INITIAL   PLAN:  PT FREQUENCY: 2x/week  PT DURATION: 4 weeks  PLANNED INTERVENTIONS: 97164- PT Re-evaluation, 97110-Therapeutic exercises, 97530- Therapeutic activity, 97112- Neuromuscular re-education, 97535- Self Care, 16109- Manual therapy, [phone]- Gait training, [phone]- Orthotic Fit/training, [phone]- Canalith repositioning, [id number]- Aquatic Therapy, [phone]- Splinting, Patient/Family education, Balance training, Stair training, Taping, Dry Needling, Joint mobilization, Joint manipulation, Spinal manipulation, Spinal mobilization, Scar mobilization, and DME instructions.   PLAN FOR NEXT SESSION:  Requires demonstration, one step commands.  Easily frustrated and emotional at times. Progress balance training, strength training of legs; progress HEP; seeing OT for right shoulder.   [name], PTA/CLT [facility] Ph: [phone]  11:26 AM, 12/25/23

## 2023-12-30 ENCOUNTER — Encounter (HOSPITAL_COMMUNITY): Payer: 59

## 2024-01-01 ENCOUNTER — Encounter (HOSPITAL_COMMUNITY): Payer: 59

## 2024-01-09 ENCOUNTER — Ambulatory Visit (HOSPITAL_COMMUNITY)

## 2024-01-09 DIAGNOSIS — R29898 Other symptoms and signs involving the musculoskeletal system: Secondary | ICD-10-CM

## 2024-01-09 DIAGNOSIS — R262 Difficulty in walking, not elsewhere classified: Secondary | ICD-10-CM | POA: Diagnosis not present

## 2024-01-09 DIAGNOSIS — R2681 Unsteadiness on feet: Secondary | ICD-10-CM | POA: Diagnosis not present

## 2024-01-09 DIAGNOSIS — M25611 Stiffness of right shoulder, not elsewhere classified: Secondary | ICD-10-CM | POA: Diagnosis not present

## 2024-01-09 DIAGNOSIS — M25511 Pain in right shoulder: Secondary | ICD-10-CM | POA: Diagnosis not present

## 2024-01-09 NOTE — Therapy (Signed)
 OUTPATIENT PHYSICAL THERAPY LOWER EXTREMITY TREATMENT   Patient Name: Sheryl [name] MRN: [id number] DOB:07/10/1956, 68 y.o., female Today's Date: 01/09/2024  END OF SESSION:  PT End of Session - 01/09/24 1017     Visit Number 6    Number of Visits 8    Date for PT Re-Evaluation 12/26/23    Authorization Type [employer] Medicare    Authorization Time Period please check auth    PT Start Time 1017    PT Stop Time 1100    PT Time Calculation (min) 43 min    Equipment Utilized During Treatment Gait belt    Activity Tolerance Patient tolerated treatment well    Behavior During Therapy WFL for tasks assessed/performed;Anxious               Past Medical History:  Diagnosis Date   Chronic back pain 10/22/2011   Chronic neck pain 10/22/2011   Depression    HLD (hyperlipidemia)    Hypertension    Past Surgical History:  Procedure Laterality Date   BREAST CYST EXCISION     right   COLONOSCOPY     per patient, many years ago at [facility]   COLONOSCOPY WITH PROPOFOL N/A 11/16/2021   Procedure: COLONOSCOPY WITH PROPOFOL;  Surgeon: Corbin Ade, MD;  Location: [facility];  Service: Endoscopy;  Laterality: N/A;  9:15am   POLYPECTOMY  11/16/2021   Procedure: POLYPECTOMY;  Surgeon: Corbin Ade, MD;  Location: [facility];  Service: Endoscopy;;   TUBAL LIGATION     Patient Active Problem List   Diagnosis Date Noted   History of wheezing 11/15/2021   Dental abscess 11/15/2021   Colon cancer screening 09/03/2021   Pain, dental 10/12/2020   Hyperlipemia 09/07/2020   Borderline diabetes 09/06/2020   Bullous impetigo 07/26/2015   Mixed incontinence urge and stress 07/25/2015   Rotator cuff syndrome of left shoulder 12/19/2014   Difficulty walking 05/06/2012   Stiffness of vertebral column 05/06/2012   Decreased strength 05/06/2012   Class 3 obesity 04/16/2012   MDD (major depressive disorder) 04/16/2012   Essential hypertension, benign 04/16/2012   Back pain  04/16/2012   Cervical strain, acute 04/16/2012    PCP: Renaye Rakers, MD  REFERRING PROVIDER: Renaye Rakers, MD REFERRING DIAG: R26.81 (ICD-10-CM) - Unsteady gait  THERAPY DIAG:  Other symptoms and signs involving the musculoskeletal system  Difficulty in walking, not elsewhere classified  Unsteady gait  Rationale for Evaluation and Treatment: Rehabilitation  ONSET DATE: 1 year  SUBJECTIVE:   SUBJECTIVE STATEMENT: Pt reports some pain in L hip and R shoulder today. Reports 10/10.  Eval:  Patient reports she has a hard time walking/ keeping her balance sometimes; it's not all the time.  She has to furniture walk in her home sometimes to keep her balance.  Patient seems occasionally confused during subjective today. She states she sometimes walks sideways; seems unsure of any cause of her unbalance.  States she is borderline DM, She does take blood pressure medication.  Some right side shoulder and leg pain.  Daughters or sisters usually come to her appts with her.  She becomes emotional during interview this morning. Right handed; having trouble with controlling right arm BP left arm 150/96 has not taken her BP meds this morning; daughter arrives at appt. And states her daughters are concerned about her cognition and right side weakness, complaints of pain  PERTINENT HISTORY: Significant depression per her daughter  PAIN:  Are you having pain? Yes: NPRS  scale: 8/10 right shoulder, 0-10/10 right leg Pain location: right shoulder and right leg Pain description: tight,  throbbing, aching Aggravating factors: leg worse at night, movement right arm, weaker Relieving factors: massage, muscle rub, BC powder  PRECAUTIONS: Fall     WEIGHT BEARING RESTRICTIONS: No  FALLS:  Has patient fallen in last 6 months? No  LIVING ENVIRONMENT: Lives with: lives with an adult companion Lives in: House/apartment Stairs: Yes: External: 5 steps; on right going up, on left going up, and bilateral  but cannot reach both Has following equipment at home: Single point cane and Quad cane small base  OCCUPATION: not working  PLOF: Independent  PATIENT GOALS: walk without worrying about falling  NEXT MD VISIT: 11/28/23  OBJECTIVE:  Note: Objective measures were completed at Evaluation unless otherwise noted.  DIAGNOSTIC FINDINGS: none  PATIENT SURVEYS:  LEFS 38/80 47.5%  01/09/24: 44 / 80 = 55.0 %  COGNITION: Overall cognitive status:  some difficulty answering more than yes/no questions; visibly frustrated and starts crying during subjective; improves when her daughter [name] arrives who states her family is concerned about her cognition      SENSATION: Appears intact at eval  EDEMA:  None noted  POSTURE: rounded shoulders, forward head, and heavy chested  PALPATION: Not specifically tested today  LOWER EXTREMITY ROM:  Active ROM Right eval Left eval  Hip flexion    Hip extension    Hip abduction    Hip adduction    Hip internal rotation    Hip external rotation    Knee flexion    Knee extension    Ankle dorsiflexion    Ankle plantarflexion    Ankle inversion    Ankle eversion     (Blank rows = not tested)  LOWER EXTREMITY MMT:  MMT Right eval Left eval  Hip flexion 4 4-  Hip extension 3- 3-  Hip abduction    Hip adduction    Hip internal rotation    Hip external rotation    Knee flexion 4 4-  Knee extension 4+ 4+  Ankle dorsiflexion 4- 5  Ankle plantarflexion    Ankle inversion    Ankle eversion     (Blank rows = not tested)    FUNCTIONAL TESTS:  5 times sit to stand: 27.97 sec using hands to assist up to standin Timed up and go (TUG): 16.29  no AD SLS 3" each leg  01/09/24: 5xSTS: 29 sec, no UE use. TUG: 16 sec  SLS: R: 5", L: 8"  GAIT: Distance walked: 50 ft in clinic Assistive device utilized: None Level of assistance: SBA Comments: touches wall as she walks; sometimes tends to walk with small base of support                                                                                                                                 TREATMENT DATE:  01/09/2024 Progress Note: 5xSTS, TUG, DGI, SLS,  LEFS Review Goals Pt educ on importance of HEP, pacing, and PT recommendations   12/25/23 Standing:  heelraises on incline 20X  Hip abduction 2x10  Hip extension 2X10  Toe taps on 6" box 20X no UE support  Tandem stance 30"X2 each LE with intermittent HHA  Vector stance 5X3" each with 1 UE assist  Lunges on 4" no UE assist 2X10 each  Forward step ups 4" 10X each with 1 UE assist  Lateral step ups with eccentric lowering 4" 10X each with 1 UE assist Sitting:  sit to stands feet on blue foam no UE 10X  12/23/23 Standing:  heelraises on incline 20X  Hip abduction 2x10  Hip extension 2X10  Toe taps on 6" box 20X no UE support  Tandem stance 1' each LE with intermittent HHA  Vector stance 5X3" each with 1 UE assist  Lunges on 4" no UE assist 2X10 each Sitting:  sit to stands feet on blue foam no UE 10X      DGI 1. Gait level surface (2) Mild Impairment: Walks 20', uses assistive devices, slower speed, mild gait deviations. 2. Change in gait speed (1) Moderate Impairment: Makes only minor adjustments to walking speed, or accomplishes a change in speed with significant gait deviations, or changes speed but has significant gait deviations, or changes speed but loses balance but is able to recover and continue walking. 3. Gait with horizontal head turns (1) Moderate Impairment: Performs head turns with moderate change in gait velocity, slows down, staggers but recovers, can continue to walk. 4. Gait with vertical head turns 1) Moderate Impairment: Performs head turns with moderate change in gait velocity, slows down, staggers but recovers, can continue to walk. 5. Gait and pivot turn (2) Mild Impairment: Pivot turns safely in > 3 seconds and stops with no loss of balance. 6. Step over obstacle (2) Mild  Impairment: Is able to step over box, but must slow down and adjust steps to clear box safely. 7. Step around obstacles (2) Mild Impairment: Is able to step around both cones, but must slow down and adjust steps to clear cones. 8. Stairs (1) Moderate Impairment: Two feet to a stair, must use rail.  TOTAL SCORE: 12 / 24  DGI 01/09/24 1. Gait level surface (1) Moderate Impairment: Walks 20', slow speed, abnormal gait pattern, evidence for imbalance. 2. Change in gait speed (1) Moderate Impairment: Makes only minor adjustments to walking speed, or accomplishes a change in speed with significant gait deviations, or changes speed but has significant gait deviations, or changes speed but loses balance but is able to recover and continue walking. 3. Gait with horizontal head turns (1) Moderate Impairment: Performs head turns with moderate change in gait velocity, slows down, staggers but recovers, can continue to walk. 4. Gait with vertical head turns 1) Moderate Impairment: Performs head turns with moderate change in gait velocity, slows down, staggers but recovers, can continue to walk. 5. Gait and pivot turn (0) Severe Impairment: Cannot turn safely, requires assistance to turn and stop. 6. Step over obstacle (3) Normal: Is able to step over the box without changing gait speed, no evidence of imbalance. 7. Step around obstacles (3) Normal: Is able to walk around cones safely without changing gait speed; no evidence of imbalance. 8. Stairs (1) Moderate Impairment: Two feet to a stair, must use rail.  TOTAL SCORE: 11 / 24    11/27/23 physical therapy evaluation and HEP instruction    PATIENT EDUCATION:  Education details:  Patient educated on exam findings, POC, scope of PT, HEP, and benefit of using a cane for ambulation outside of home. Person educated: Patient Education method: Explanation, Demonstration, and Handouts Education comprehension: verbalized understanding, returned  demonstration, verbal cues required, and tactile cues required  HOME EXERCISE PROGRAM: Access Code: [email or web address] URL: [email or web address] Date: 11/27/2023 Prepared by: [facility]  Exercises - Sit to Stand with Armchair  - 2 x daily - 7 x weekly - 1 sets - 10 reps - standing single leg balance at the counter (try to not hold on)  - 2 x daily - 7 x weekly - 1 sets - 3 reps - 10 sec hold  12/12/23:  Bridge 10x 2 set  ASSESSMENT:  CLINICAL IMPRESSION: Progress note performed on this date. Patient demonstrates improvements with static single leg balance, but continues to have limitations with dynamic balance, self perceived function, pain, and LE strength/power which contribute to increased fall risk, and impairs patients independence of ADLs and iADLs. Patient becomes confused at times during the session, requiring redirection of topic. But patient remains pleasant t/o. Pt will continue to benefit from skilled Physical Therapy services to address deficits/limitations in order to improve functional and QOL.      OBJECTIVE IMPAIRMENTS: Abnormal gait, decreased balance, decreased cognition, decreased knowledge of condition, decreased knowledge of use of DME, difficulty walking, decreased strength, increased fascial restrictions, impaired perceived functional ability, and pain.   ACTIVITY LIMITATIONS: bending, standing, squatting, sleeping, stairs, transfers, and locomotion level  PARTICIPATION LIMITATIONS: meal prep, cleaning, laundry, driving, shopping, and community activity  PERSONAL FACTORS: 1 comorbidity: depression  are also affecting patient's functional outcome.   REHAB POTENTIAL: Good  CLINICAL DECISION MAKING: Evolving/moderate complexity  EVALUATION COMPLEXITY: Moderate   GOALS: Goals reviewed with patient? No  SHORT TERM GOALS: Target date: 01/23/2024 patient will be independent with initial HEP  Baseline: Goal status: IN PROGRESS  2.  Patient will improve  SLS to 10" each leg to demonstrate improved functional balance Baseline:  Goal status: IN PROGRESS, ADJUSTED ON 01/09/24  LONG TERM GOALS: Target date: 02/06/24  Patient will be independent in self management strategies to improve quality of life and functional outcomes.  Baseline:  Goal status: IN PROGRESS  2.  Patient will report 50% improvement overall  Baseline:  Goal status: IN PROGRESS  3.  Patient will improve SLS to 15" each to demonstrate improved functional balance Baseline: 3" each Goal status: IN PROGRESS, ADJUSTED ON 01/09/24  4.  Patient will increase  bilateral leg MMT's to 4+ to 5/5 to allow navigation of steps without gait deviation or loss of balance  Baseline: see above Goal status: IN PROGRESS  5.  Patient will improve her TUG to 10" or less to demonstrate improved functional mobility and decreased fall risk Baseline: 16.29 sec Goal status: IN PROGRESS  6.  Patient will improve 5 times sit to stand score to 15 sec or less to demonstrate improved functional mobility and increased leg strength.    Baseline: 27.97 Goal status: IN PROGRESS   PLAN:  PT FREQUENCY: 2x/week  PT DURATION: 4 weeks  PLANNED INTERVENTIONS: 97164- PT Re-evaluation, 97110-Therapeutic exercises, 97530- Therapeutic activity, 97112- Neuromuscular re-education, 97535- Self Care, 19147- Manual therapy, [phone]- Gait training, [phone]- Orthotic Fit/training, [phone]- Canalith repositioning, [id number]- Aquatic Therapy, [phone]- Splinting, Patient/Family education, Balance training, Stair training, Taping, Dry Needling, Joint mobilization, Joint manipulation, Spinal manipulation, Spinal mobilization, Scar mobilization, and DME instructions.   PLAN FOR NEXT SESSION:  Requires demonstration, one step commands.  Easily frustrated and emotional at times. Progress balance training, strength training of legs; progress HEP; seeing OT for right shoulder.      12:53 PM, 01/09/24 Chryl Heck, PT,  DPT [facility] Medicare Auth Request Information  Date of referral: 01/09/2024  Referring provider: Renaye Rakers, MD Referring diagnosis (ICD 10)? R26.81 (ICD-10-CM) - Unsteady gait Treatment diagnosis (ICD 10)? (if different than referring diagnosis)  R29.898 R26.2 R26.81  Functional Tool Score: LEFS 01/09/24: 44 / 80 = 55.0 %  What was this (referring dx) caused by? Unspecified  Nature of Condition: Chronic (continuous duration > 3 months)   Laterality: Both  Current Functional Measure Score: LEFS See above  Objective measurements identify impairments when they are compared to normal values, the uninvolved extremity, and prior level of function.  [x]  Yes  []  No  Objective assessment of functional ability: Moderate functional limitations   Briefly describe symptoms: Abnormal gait, decreased balance, decreased cognition, decreased knowledge of condition, decreased knowledge of use of DME, difficulty walking, decreased strength, increased fascial restrictions, impaired perceived functional ability, and pain  How did symptoms start: Insidious  Average pain intensity:  Last 24 hours: 10/10  Past week: 10/10  How often does the pt experience symptoms? Constantly  How much have the symptoms interfered with usual daily activities? Moderately  How has condition changed since care began at this facility? A little better  In general, how is the patients overall health? Fair   BACK PAIN (STarT Back Screening Tool) No

## 2024-01-16 DIAGNOSIS — I10 Essential (primary) hypertension: Secondary | ICD-10-CM | POA: Diagnosis not present

## 2024-01-16 DIAGNOSIS — E1169 Type 2 diabetes mellitus with other specified complication: Secondary | ICD-10-CM | POA: Diagnosis not present

## 2024-01-19 ENCOUNTER — Ambulatory Visit (HOSPITAL_COMMUNITY): Admitting: Physical Therapy

## 2024-01-19 DIAGNOSIS — R262 Difficulty in walking, not elsewhere classified: Secondary | ICD-10-CM

## 2024-01-19 DIAGNOSIS — R29898 Other symptoms and signs involving the musculoskeletal system: Secondary | ICD-10-CM | POA: Diagnosis not present

## 2024-01-19 DIAGNOSIS — M25511 Pain in right shoulder: Secondary | ICD-10-CM | POA: Diagnosis not present

## 2024-01-19 DIAGNOSIS — R2681 Unsteadiness on feet: Secondary | ICD-10-CM | POA: Diagnosis not present

## 2024-01-19 DIAGNOSIS — M25611 Stiffness of right shoulder, not elsewhere classified: Secondary | ICD-10-CM | POA: Diagnosis not present

## 2024-01-19 NOTE — Therapy (Signed)
 OUTPATIENT PHYSICAL THERAPY LOWER EXTREMITY TREATMENT   Patient Name: Sheryl [name] MRN: [id number] DOB:[birth year], 68 y.o., female Today's Date: 01/19/2024  END OF SESSION:  PT End of Session - 01/19/24 1515     Visit Number 7    Number of Visits 14    Date for PT Re-Evaluation 02/06/24    Authorization Type [employer]    Authorization Time Period please check auth    PT Start Time 1350    PT Stop Time 1430    PT Time Calculation (min) 40 min    Equipment Utilized During Treatment Gait belt    Activity Tolerance Patient tolerated treatment well    Behavior During Therapy WFL for tasks assessed/performed;Anxious                Past Medical History:  Diagnosis Date   Chronic back pain 10/22/2011   Chronic neck pain 10/22/2011   Depression    HLD (hyperlipidemia)    Hypertension    Past Surgical History:  Procedure Laterality Date   BREAST CYST EXCISION     right   COLONOSCOPY     per patient, many years ago at [facility]   COLONOSCOPY WITH PROPOFOL N/A 11/16/2021   Procedure: COLONOSCOPY WITH PROPOFOL;  Surgeon: Corbin Ade, MD;  Location: [facility];  Service: Endoscopy;  Laterality: N/A;  9:15am   POLYPECTOMY  11/16/2021   Procedure: POLYPECTOMY;  Surgeon: Corbin Ade, MD;  Location: [facility];  Service: Endoscopy;;   TUBAL LIGATION     Patient Active Problem List   Diagnosis Date Noted   History of wheezing 11/15/2021   Dental abscess 11/15/2021   Colon cancer screening 09/03/2021   Pain, dental 10/12/2020   Hyperlipemia 09/07/2020   Borderline diabetes 09/06/2020   Bullous impetigo 07/26/2015   Mixed incontinence urge and stress 07/25/2015   Rotator cuff syndrome of left shoulder 12/19/2014   Difficulty walking 05/06/2012   Stiffness of vertebral column 05/06/2012   Decreased strength 05/06/2012   Class 3 obesity 04/16/2012   MDD (major depressive disorder) 04/16/2012   Essential hypertension, benign 04/16/2012   Back pain  04/16/2012   Cervical strain, acute 04/16/2012    PCP: Renaye Rakers, MD  REFERRING PROVIDER: Renaye Rakers, MD REFERRING DIAG: R26.81 (ICD-10-CM) - Unsteady gait  THERAPY DIAG:  Other symptoms and signs involving the musculoskeletal system  Difficulty in walking, not elsewhere classified  Unsteady gait  Rationale for Evaluation and Treatment: Rehabilitation  ONSET DATE: 1 year  SUBJECTIVE:   SUBJECTIVE STATEMENT: Pt states she got the new printout for her appts until the end of April, however therapist noticed OT was not scheduled; secretary was notified Sheryl [name]).  Lt hip pain "I've been overworking it".  8/10. Reports also "pain in my crotch"  Eval:  Patient reports she has a hard time walking/ keeping her balance sometimes; it's not all the time.  She has to furniture walk in her home sometimes to keep her balance.  Patient seems occasionally confused during subjective today. She states she sometimes walks sideways; seems unsure of any cause of her unbalance.  States she is borderline DM, She does take blood pressure medication.  Some right side shoulder and leg pain.  Daughters or sisters usually come to her appts with her.  She becomes emotional during interview this morning. Right handed; having trouble with controlling right arm BP left arm 150/96 has not taken her BP meds this morning; daughter arrives at appt. And states her  daughters are concerned about her cognition and right side weakness, complaints of pain  PERTINENT HISTORY: Significant depression per her daughter  PAIN:  Are you having pain? Yes: NPRS scale: 8/10 Lt hip Pain location: Lt hip  Pain description: throbbing sharp pains Aggravating factors: leg worse at night Relieving factors: massage, muscle rub, BC powder  PRECAUTIONS: Fall     WEIGHT BEARING RESTRICTIONS: No  FALLS:  Has patient fallen in last 6 months? No  LIVING ENVIRONMENT: Lives with: lives with an adult companion Lives in:  House/apartment Stairs: Yes: External: 5 steps; on right going up, on left going up, and bilateral but cannot reach both Has following equipment at home: Single point cane and Quad cane small base  OCCUPATION: not working  PLOF: Independent  PATIENT GOALS: walk without worrying about falling  NEXT MD VISIT: 11/28/23  OBJECTIVE:  Note: Objective measures were completed at Evaluation unless otherwise noted.  DIAGNOSTIC FINDINGS: none  PATIENT SURVEYS:  LEFS 38/80 47.5%  01/09/24: 44 / 80 = 55.0 %  COGNITION: Overall cognitive status:  some difficulty answering more than yes/no questions; visibly frustrated and starts crying during subjective; improves when her daughter Sheryl [name] arrives who states her family is concerned about her cognition      SENSATION: Appears intact at eval  EDEMA:  None noted  POSTURE: rounded shoulders, forward head, and heavy chested  PALPATION: Not specifically tested today  LOWER EXTREMITY MMT:  MMT Right eval Left eval  Hip flexion 4 4-  Hip extension 3- 3-  Hip abduction    Hip adduction    Hip internal rotation    Hip external rotation    Knee flexion 4 4-  Knee extension 4+ 4+  Ankle dorsiflexion 4- 5  Ankle plantarflexion    Ankle inversion    Ankle eversion     (Blank rows = not tested)    FUNCTIONAL TESTS:  5 times sit to stand: 27.97 sec using hands to assist up to standin Timed up and go (TUG): 16.29  no AD SLS 3" each leg  01/09/24: 5xSTS: 29 sec, no UE use. TUG: 16 sec  SLS: R: 5", L: 8"  GAIT: Distance walked: 50 ft in clinic Assistive device utilized: None Level of assistance: SBA Comments: touches wall as she walks; sometimes tends to walk with small base of support                                                                                                                                TREATMENT DATE:  01/19/24 Standing:  heelraises 20X  Hp abduction with 3# weigh 2X10  Hip extension with 3# weight  2X10  Alternating march with 3# weight 1X10  Vectors 10X3" each with 1 UE assist  Lunge onto step no UE 2X10 each LE Seated piriformis stretch  30"X2 each Sit to stands no UE10X  01/09/24 Progress Note: 5xSTS, TUG, DGI, SLS, LEFS Review Goals Pt educ  on importance of HEP, pacing, and PT recommendations   12/25/23 Standing:  heelraises on incline 20X  Hip abduction 2x10  Hip extension 2X10  Toe taps on 6" box 20X no UE support  Tandem stance 30"X2 each LE with intermittent HHA  Vector stance 5X3" each with 1 UE assist  Lunges on 4" no UE assist 2X10 each  Forward step ups 4" 10X each with 1 UE assist  Lateral step ups with eccentric lowering 4" 10X each with 1 UE assist Sitting:  sit to stands feet on blue foam no UE 10X  12/23/23 Standing:  heelraises on incline 20X  Hip abduction 2x10  Hip extension 2X10  Toe taps on 6" box 20X no UE support  Tandem stance 1' each LE with intermittent HHA  Vector stance 5X3" each with 1 UE assist  Lunges on 4" no UE assist 2X10 each Sitting:  sit to stands feet on blue foam no UE 10X      DGI 1. Gait level surface (2) Mild Impairment: Walks 20', uses assistive devices, slower speed, mild gait deviations. 2. Change in gait speed (1) Moderate Impairment: Makes only minor adjustments to walking speed, or accomplishes a change in speed with significant gait deviations, or changes speed but has significant gait deviations, or changes speed but loses balance but is able to recover and continue walking. 3. Gait with horizontal head turns (1) Moderate Impairment: Performs head turns with moderate change in gait velocity, slows down, staggers but recovers, can continue to walk. 4. Gait with vertical head turns 1) Moderate Impairment: Performs head turns with moderate change in gait velocity, slows down, staggers but recovers, can continue to walk. 5. Gait and pivot turn (2) Mild Impairment: Pivot turns safely in > 3 seconds and stops with no loss  of balance. 6. Step over obstacle (2) Mild Impairment: Is able to step over box, but must slow down and adjust steps to clear box safely. 7. Step around obstacles (2) Mild Impairment: Is able to step around both cones, but must slow down and adjust steps to clear cones. 8. Stairs (1) Moderate Impairment: Two feet to a stair, must use rail.  TOTAL SCORE: 12 / 24  DGI 01/09/24 1. Gait level surface (1) Moderate Impairment: Walks 20', slow speed, abnormal gait pattern, evidence for imbalance. 2. Change in gait speed (1) Moderate Impairment: Makes only minor adjustments to walking speed, or accomplishes a change in speed with significant gait deviations, or changes speed but has significant gait deviations, or changes speed but loses balance but is able to recover and continue walking. 3. Gait with horizontal head turns (1) Moderate Impairment: Performs head turns with moderate change in gait velocity, slows down, staggers but recovers, can continue to walk. 4. Gait with vertical head turns 1) Moderate Impairment: Performs head turns with moderate change in gait velocity, slows down, staggers but recovers, can continue to walk. 5. Gait and pivot turn (0) Severe Impairment: Cannot turn safely, requires assistance to turn and stop. 6. Step over obstacle (3) Normal: Is able to step over the box without changing gait speed, no evidence of imbalance. 7. Step around obstacles (3) Normal: Is able to walk around cones safely without changing gait speed; no evidence of imbalance. 8. Stairs (1) Moderate Impairment: Two feet to a stair, must use rail.  TOTAL SCORE: 11 / 24    11/27/23 physical therapy evaluation and HEP instruction    PATIENT EDUCATION:  Education details: Patient educated on exam findings,  POC, scope of PT, HEP, and benefit of using a cane for ambulation outside of home. Person educated: Patient Education method: Explanation, Demonstration, and Handouts Education comprehension:  verbalized understanding, returned demonstration, verbal cues required, and tactile cues required  HOME EXERCISE PROGRAM: Access Code: [email or web address] URL: [email or web address] Date: 11/27/2023 Prepared by: [facility]  Exercises - Sit to Stand with Armchair  - 2 x daily - 7 x weekly - 1 sets - 10 reps - standing single leg balance at the counter (try to not hold on)  - 2 x daily - 7 x weekly - 1 sets - 3 reps - 10 sec hold  12/12/23:  Bridge 10x 2 set  ASSESSMENT:  CLINICAL IMPRESSION: Continued with focus on improving LE strength and stability.  Pt with only one episode of frustration where she became tearful but quickly recovered.  Noted improvement in abiltiy to process and carry out instructions for exercises as compared to last visit this therapist worked with patient (12/25/23).   Patient demonstrates improvements with static single leg balance, but continues to have limitations with dynamic balance, self perceived function, pain, and LE strength/power which contribute to increased fall risk, and impairs patients independence of ADLs and iADLs. Patient becomes confused at times during the session, requiring redirection of topic. But patient remains pleasant t/o. Pt will continue to benefit from skilled Physical Therapy services to address deficits/limitations in order to improve functional and QOL.     OBJECTIVE IMPAIRMENTS: Abnormal gait, decreased balance, decreased cognition, decreased knowledge of condition, decreased knowledge of use of DME, difficulty walking, decreased strength, increased fascial restrictions, impaired perceived functional ability, and pain.   ACTIVITY LIMITATIONS: bending, standing, squatting, sleeping, stairs, transfers, and locomotion level  PARTICIPATION LIMITATIONS: meal prep, cleaning, laundry, driving, shopping, and community activity  PERSONAL FACTORS: 1 comorbidity: depression  are also affecting patient's functional outcome.   REHAB POTENTIAL:  Good  CLINICAL DECISION MAKING: Evolving/moderate complexity  EVALUATION COMPLEXITY: Moderate   GOALS: Goals reviewed with patient? No  SHORT TERM GOALS: Target date: 01/23/2024 patient will be independent with initial HEP  Baseline: Goal status: IN PROGRESS  2.  Patient will improve SLS to 10" each leg to demonstrate improved functional balance Baseline:  Goal status: IN PROGRESS, ADJUSTED ON 01/09/24  LONG TERM GOALS: Target date: 02/06/24  Patient will be independent in self management strategies to improve quality of life and functional outcomes.  Baseline:  Goal status: IN PROGRESS  2.  Patient will report 50% improvement overall  Baseline:  Goal status: IN PROGRESS  3.  Patient will improve SLS to 15" each to demonstrate improved functional balance Baseline: 3" each Goal status: IN PROGRESS, ADJUSTED ON 01/09/24  4.  Patient will increase  bilateral leg MMT's to 4+ to 5/5 to allow navigation of steps without gait deviation or loss of balance  Baseline: see above Goal status: IN PROGRESS  5.  Patient will improve her TUG to 10" or less to demonstrate improved functional mobility and decreased fall risk Baseline: 16.29 sec Goal status: IN PROGRESS  6.  Patient will improve 5 times sit to stand score to 15 sec or less to demonstrate improved functional mobility and increased leg strength.    Baseline: 27.97 Goal status: IN PROGRESS   PLAN:  PT FREQUENCY: 2x/week  PT DURATION: 4 weeks  PLANNED INTERVENTIONS: 97164- PT Re-evaluation, 97110-Therapeutic exercises, 97530- Therapeutic activity, [id number]- Neuromuscular re-education, 97535- Self Care, [id number]- Manual therapy, [id number]- Gait training, [phone]- Orthotic Fit/training, [phone]- Canalith repositioning, [id number]- Aquatic  Therapy, 97760- Splinting, Patient/Family education, Balance training, Stair training, Taping, Dry Needling, Joint mobilization, Joint manipulation, Spinal manipulation, Spinal mobilization, Scar  mobilization, and DME instructions.   PLAN FOR NEXT SESSION:  Requires demonstration, one step commands.  Easily frustrated and emotional at times. Progress balance training, strength training of legs; progress HEP; seeing OT for right shoulder.      3:21 PM, 01/19/24 [name], PTA/CLT [facility] Ph: [phone]

## 2024-01-21 ENCOUNTER — Ambulatory Visit (HOSPITAL_COMMUNITY): Attending: Family Medicine | Admitting: Physical Therapy

## 2024-01-21 DIAGNOSIS — M25611 Stiffness of right shoulder, not elsewhere classified: Secondary | ICD-10-CM | POA: Insufficient documentation

## 2024-01-21 DIAGNOSIS — R262 Difficulty in walking, not elsewhere classified: Secondary | ICD-10-CM | POA: Diagnosis not present

## 2024-01-21 DIAGNOSIS — R2681 Unsteadiness on feet: Secondary | ICD-10-CM | POA: Diagnosis not present

## 2024-01-21 DIAGNOSIS — R29898 Other symptoms and signs involving the musculoskeletal system: Secondary | ICD-10-CM | POA: Diagnosis not present

## 2024-01-21 DIAGNOSIS — M25511 Pain in right shoulder: Secondary | ICD-10-CM | POA: Diagnosis not present

## 2024-01-21 NOTE — Therapy (Signed)
 OUTPATIENT PHYSICAL THERAPY LOWER EXTREMITY TREATMENT   Patient Name: Sheryl [name] MRN: [id number] DOB:11/02/1955, 68 y.o., female Today's Date: 01/21/2024  END OF SESSION:  PT End of Session - 01/21/24 1257     Visit Number 8    Number of Visits 14    Date for PT Re-Evaluation 02/06/24    Authorization Type [employer]    Authorization Time Period please check auth    PT Start Time 1150    PT Stop Time 1232    PT Time Calculation (min) 42 min    Equipment Utilized During Treatment Gait belt    Activity Tolerance Patient tolerated treatment well    Behavior During Therapy WFL for tasks assessed/performed;Anxious                 Past Medical History:  Diagnosis Date   Chronic back pain 10/22/2011   Chronic neck pain 10/22/2011   Depression    HLD (hyperlipidemia)    Hypertension    Past Surgical History:  Procedure Laterality Date   BREAST CYST EXCISION     right   COLONOSCOPY     per patient, many years ago at [facility]   COLONOSCOPY WITH PROPOFOL N/A 11/16/2021   Procedure: COLONOSCOPY WITH PROPOFOL;  Surgeon: Corbin Ade, MD;  Location: [facility];  Service: Endoscopy;  Laterality: N/A;  9:15am   POLYPECTOMY  11/16/2021   Procedure: POLYPECTOMY;  Surgeon: Corbin Ade, MD;  Location: [facility];  Service: Endoscopy;;   TUBAL LIGATION     Patient Active Problem List   Diagnosis Date Noted   History of wheezing 11/15/2021   Dental abscess 11/15/2021   Colon cancer screening 09/03/2021   Pain, dental 10/12/2020   Hyperlipemia 09/07/2020   Borderline diabetes 09/06/2020   Bullous impetigo 07/26/2015   Mixed incontinence urge and stress 07/25/2015   Rotator cuff syndrome of left shoulder 12/19/2014   Difficulty walking 05/06/2012   Stiffness of vertebral column 05/06/2012   Decreased strength 05/06/2012   Class 3 obesity 04/16/2012   MDD (major depressive disorder) 04/16/2012   Essential hypertension, benign 04/16/2012   Back pain  04/16/2012   Cervical strain, acute 04/16/2012    PCP: Renaye Rakers, MD  REFERRING PROVIDER: Renaye Rakers, MD REFERRING DIAG: R26.81 (ICD-10-CM) - Unsteady gait  THERAPY DIAG:  Other symptoms and signs involving the musculoskeletal system  Difficulty in walking, not elsewhere classified  Unsteady gait  Rationale for Evaluation and Treatment: Rehabilitation  ONSET DATE: 1 year  SUBJECTIVE:   SUBJECTIVE STATEMENT: Pt states she has some thigh soreness but doing the exercises 2-3X day. Pt states when she lays down she has some pain into Lt groin area.   Eval:  Patient reports she has a hard time walking/ keeping her balance sometimes; it's not all the time.  She has to furniture walk in her home sometimes to keep her balance.  Patient seems occasionally confused during subjective today. She states she sometimes walks sideways; seems unsure of any cause of her unbalance.  States she is borderline DM, She does take blood pressure medication.  Some right side shoulder and leg pain.  Daughters or sisters usually come to her appts with her.  She becomes emotional during interview this morning. Right handed; having trouble with controlling right arm BP left arm 150/96 has not taken her BP meds this morning; daughter arrives at appt. And states her daughters are concerned about her cognition and right side weakness, complaints of pain  PERTINENT HISTORY: Significant depression per her daughter  PAIN:  Are you having pain? Yes: NPRS scale: 8/10 Lt hip Pain location: Lt hip  Pain description: throbbing sharp pains Aggravating factors: leg worse at night Relieving factors: massage, muscle rub, BC powder  PRECAUTIONS: Fall     WEIGHT BEARING RESTRICTIONS: No  FALLS:  Has patient fallen in last 6 months? No  LIVING ENVIRONMENT: Lives with: lives with an adult companion Lives in: House/apartment Stairs: Yes: External: 5 steps; on right going up, on left going up, and bilateral but  cannot reach both Has following equipment at home: Single point cane and Quad cane small base  OCCUPATION: not working  PLOF: Independent  PATIENT GOALS: walk without worrying about falling  NEXT MD VISIT: 11/28/23  OBJECTIVE:  Note: Objective measures were completed at Evaluation unless otherwise noted.  DIAGNOSTIC FINDINGS: none  PATIENT SURVEYS:  LEFS 38/80 47.5%  01/09/24: 44 / 80 = 55.0 %  COGNITION: Overall cognitive status:  some difficulty answering more than yes/no questions; visibly frustrated and starts crying during subjective; improves when her daughter [name] arrives who states her family is concerned about her cognition      SENSATION: Appears intact at eval  EDEMA:  None noted  POSTURE: rounded shoulders, forward head, and heavy chested  PALPATION: Not specifically tested today  LOWER EXTREMITY MMT:  MMT Right eval Left eval  Hip flexion 4 4-  Hip extension 3- 3-  Hip abduction    Hip adduction    Hip internal rotation    Hip external rotation    Knee flexion 4 4-  Knee extension 4+ 4+  Ankle dorsiflexion 4- 5  Ankle plantarflexion    Ankle inversion    Ankle eversion     (Blank rows = not tested)    FUNCTIONAL TESTS:  5 times sit to stand: 27.97 sec using hands to assist up to standin Timed up and go (TUG): 16.29  no AD SLS 3" each leg  01/09/24: 5xSTS: 29 sec, no UE use. TUG: 16 sec  SLS: R: 5", L: 8"  GAIT: Distance walked: 50 ft in clinic Assistive device utilized: None Level of assistance: SBA Comments: touches wall as she walks; sometimes tends to walk with small base of support                                                                                                                                TREATMENT DATE:  01/21/24 Standing:  heelraises 20X  Hip abduction with 3# weigh 2X10  Hip extension with 3# weight 2X10  Alternating march with 3# weight 1X10  Vectors 10X3" each with 1 UE assist  Lunge onto step no UE  2X10 each LE  Tandem stance 30" X 2 each LE leading  SLS max Rt: 15", Lt 12" Sit to stands no UE10X Nustep UE/LE 5 minutes seat 6, level 3  01/19/24 Standing:  heelraises 20X  Hp abduction  with 3# weigh 2X10  Hip extension with 3# weight 2X10  Alternating march with 3# weight 1X10  Vectors 10X3" each with 1 UE assist  Lunge onto step no UE 2X10 each LE Seated piriformis stretch  30"X2 each Sit to stands no UE10X  01/09/24 Progress Note: 5xSTS, TUG, DGI, SLS, LEFS Review Goals Pt educ on importance of HEP, pacing, and PT recommendations   12/25/23 Standing:  heelraises on incline 20X  Hip abduction 2x10  Hip extension 2X10  Toe taps on 6" box 20X no UE support  Tandem stance 30"X2 each LE with intermittent HHA  Vector stance 5X3" each with 1 UE assist  Lunges on 4" no UE assist 2X10 each  Forward step ups 4" 10X each with 1 UE assist  Lateral step ups with eccentric lowering 4" 10X each with 1 UE assist Sitting:  sit to stands feet on blue foam no UE 10X  12/23/23 Standing:  heelraises on incline 20X  Hip abduction 2x10  Hip extension 2X10  Toe taps on 6" box 20X no UE support  Tandem stance 1' each LE with intermittent HHA  Vector stance 5X3" each with 1 UE assist  Lunges on 4" no UE assist 2X10 each Sitting:  sit to stands feet on blue foam no UE 10X      DGI 1. Gait level surface (2) Mild Impairment: Walks 20', uses assistive devices, slower speed, mild gait deviations. 2. Change in gait speed (1) Moderate Impairment: Makes only minor adjustments to walking speed, or accomplishes a change in speed with significant gait deviations, or changes speed but has significant gait deviations, or changes speed but loses balance but is able to recover and continue walking. 3. Gait with horizontal head turns (1) Moderate Impairment: Performs head turns with moderate change in gait velocity, slows down, staggers but recovers, can continue to walk. 4. Gait with vertical head  turns 1) Moderate Impairment: Performs head turns with moderate change in gait velocity, slows down, staggers but recovers, can continue to walk. 5. Gait and pivot turn (2) Mild Impairment: Pivot turns safely in > 3 seconds and stops with no loss of balance. 6. Step over obstacle (2) Mild Impairment: Is able to step over box, but must slow down and adjust steps to clear box safely. 7. Step around obstacles (2) Mild Impairment: Is able to step around both cones, but must slow down and adjust steps to clear cones. 8. Stairs (1) Moderate Impairment: Two feet to a stair, must use rail.  TOTAL SCORE: 12 / 24  DGI 01/09/24 1. Gait level surface (1) Moderate Impairment: Walks 20', slow speed, abnormal gait pattern, evidence for imbalance. 2. Change in gait speed (1) Moderate Impairment: Makes only minor adjustments to walking speed, or accomplishes a change in speed with significant gait deviations, or changes speed but has significant gait deviations, or changes speed but loses balance but is able to recover and continue walking. 3. Gait with horizontal head turns (1) Moderate Impairment: Performs head turns with moderate change in gait velocity, slows down, staggers but recovers, can continue to walk. 4. Gait with vertical head turns 1) Moderate Impairment: Performs head turns with moderate change in gait velocity, slows down, staggers but recovers, can continue to walk. 5. Gait and pivot turn (0) Severe Impairment: Cannot turn safely, requires assistance to turn and stop. 6. Step over obstacle (3) Normal: Is able to step over the box without changing gait speed, no evidence of imbalance. 7. Step around obstacles (  3) Normal: Is able to walk around cones safely without changing gait speed; no evidence of imbalance. 8. Stairs (1) Moderate Impairment: Two feet to a stair, must use rail.  TOTAL SCORE: 11 / 24    11/27/23 physical therapy evaluation and HEP instruction    PATIENT EDUCATION:   Education details: Patient educated on exam findings, POC, scope of PT, HEP, and benefit of using a cane for ambulation outside of home. Person educated: Patient Education method: Explanation, Demonstration, and Handouts Education comprehension: verbalized understanding, returned demonstration, verbal cues required, and tactile cues required  HOME EXERCISE PROGRAM: Access Code: [email or web address] URL: [email or web address] Date: 11/27/2023 Prepared by: [facility]  Exercises - Sit to Stand with Armchair  - 2 x daily - 7 x weekly - 1 sets - 10 reps - standing single leg balance at the counter (try to not hold on)  - 2 x daily - 7 x weekly - 1 sets - 3 reps - 10 sec hold  12/12/23:  Bridge 10x 2 set  ASSESSMENT:  CLINICAL IMPRESSION: Continued with focus on improving LE strength and stability.  Added tandem stance and SLS today.  Pt able to complete full 30" with tandem with only intermittent HHA.  Max single leg stance achieved 12-15" with either LE without UE assist.  Pt required less cues and had no episodes of frustration during session today.Did c/o knee pain with sit to stands which dissipated following activity.  Every session has noted improvement in functional and processing abilitiy.  Finished up with nustep to help improve activity tolerance. Pt will continue to benefit from skilled Physical Therapy services to address deficits/limitations in order to improve functional and QOL.     OBJECTIVE IMPAIRMENTS: Abnormal gait, decreased balance, decreased cognition, decreased knowledge of condition, decreased knowledge of use of DME, difficulty walking, decreased strength, increased fascial restrictions, impaired perceived functional ability, and pain.   ACTIVITY LIMITATIONS: bending, standing, squatting, sleeping, stairs, transfers, and locomotion level  PARTICIPATION LIMITATIONS: meal prep, cleaning, laundry, driving, shopping, and community activity  PERSONAL FACTORS: 1  comorbidity: depression  are also affecting patient's functional outcome.   REHAB POTENTIAL: Good  CLINICAL DECISION MAKING: Evolving/moderate complexity  EVALUATION COMPLEXITY: Moderate   GOALS: Goals reviewed with patient? No  SHORT TERM GOALS: Target date: 01/23/2024 patient will be independent with initial HEP  Baseline: Goal status: IN PROGRESS  2.  Patient will improve SLS to 10" each leg to demonstrate improved functional balance Baseline:  Goal status: IN PROGRESS, ADJUSTED ON 01/09/24  LONG TERM GOALS: Target date: 02/06/24  Patient will be independent in self management strategies to improve quality of life and functional outcomes.  Baseline:  Goal status: IN PROGRESS  2.  Patient will report 50% improvement overall  Baseline:  Goal status: IN PROGRESS  3.  Patient will improve SLS to 15" each to demonstrate improved functional balance Baseline: 3" each Goal status: IN PROGRESS, ADJUSTED ON 01/09/24  4.  Patient will increase  bilateral leg MMT's to 4+ to 5/5 to allow navigation of steps without gait deviation or loss of balance  Baseline: see above Goal status: IN PROGRESS  5.  Patient will improve her TUG to 10" or less to demonstrate improved functional mobility and decreased fall risk Baseline: 16.29 sec Goal status: IN PROGRESS  6.  Patient will improve 5 times sit to stand score to 15 sec or less to demonstrate improved functional mobility and increased leg strength.    Baseline: 27.97 Goal status: IN PROGRESS  PLAN:  PT FREQUENCY: 2x/week  PT DURATION: 4 weeks  PLANNED INTERVENTIONS: 97164- PT Re-evaluation, 97110-Therapeutic exercises, 97530- Therapeutic activity, 97112- Neuromuscular re-education, 97535- Self Care, [id number]- Manual therapy, [phone]- Gait training, [phone]- Orthotic Fit/training, [phone]- Canalith repositioning, [id number]- Aquatic Therapy, [phone]- Splinting, Patient/Family education, Balance training, Stair training, Taping, Dry Needling,  Joint mobilization, Joint manipulation, Spinal manipulation, Spinal mobilization, Scar mobilization, and DME instructions.   PLAN FOR NEXT SESSION:  Requires demonstration, one step commands.  Easily frustrated and emotional at times. Progress balance training, strength training of legs; progress HEP; seeing OT for right shoulder.     12:59 PM, 01/21/24 [name], PTA/CLT [facility] Ph: [phone]

## 2024-01-27 ENCOUNTER — Encounter (HOSPITAL_COMMUNITY): Payer: Self-pay

## 2024-01-27 ENCOUNTER — Ambulatory Visit (HOSPITAL_COMMUNITY)

## 2024-01-27 DIAGNOSIS — R29898 Other symptoms and signs involving the musculoskeletal system: Secondary | ICD-10-CM

## 2024-01-27 DIAGNOSIS — M25611 Stiffness of right shoulder, not elsewhere classified: Secondary | ICD-10-CM | POA: Diagnosis not present

## 2024-01-27 DIAGNOSIS — M25511 Pain in right shoulder: Secondary | ICD-10-CM | POA: Diagnosis not present

## 2024-01-27 DIAGNOSIS — R2681 Unsteadiness on feet: Secondary | ICD-10-CM

## 2024-01-27 DIAGNOSIS — R262 Difficulty in walking, not elsewhere classified: Secondary | ICD-10-CM | POA: Diagnosis not present

## 2024-01-27 NOTE — Therapy (Signed)
 OUTPATIENT PHYSICAL THERAPY LOWER EXTREMITY TREATMENT   Patient Name: Sheryl [name] MRN: [id number] DOB:[birth year], 68 y.o., female Today's Date: 01/27/2024  END OF SESSION:  PT End of Session - 01/27/24 1342     Visit Number 9    Number of Visits 14    Date for PT Re-Evaluation 02/06/24    Authorization Type [employer] Dual Complete    Authorization Time Period no auth required    PT Start Time 1345    PT Stop Time 1428    PT Time Calculation (min) 43 min    Equipment Utilized During Treatment Gait belt    Activity Tolerance Patient tolerated treatment well    Behavior During Therapy WFL for tasks assessed/performed;Anxious                  Past Medical History:  Diagnosis Date   Chronic back pain 10/22/2011   Chronic neck pain 10/22/2011   Depression    HLD (hyperlipidemia)    Hypertension    Past Surgical History:  Procedure Laterality Date   BREAST CYST EXCISION     right   COLONOSCOPY     per patient, many years ago at [facility]   COLONOSCOPY WITH PROPOFOL N/A 11/16/2021   Procedure: COLONOSCOPY WITH PROPOFOL;  Surgeon: Corbin Ade, MD;  Location: [facility];  Service: Endoscopy;  Laterality: N/A;  9:15am   POLYPECTOMY  11/16/2021   Procedure: POLYPECTOMY;  Surgeon: Corbin Ade, MD;  Location: [facility];  Service: Endoscopy;;   TUBAL LIGATION     Patient Active Problem List   Diagnosis Date Noted   History of wheezing 11/15/2021   Dental abscess 11/15/2021   Colon cancer screening 09/03/2021   Pain, dental 10/12/2020   Hyperlipemia 09/07/2020   Borderline diabetes 09/06/2020   Bullous impetigo 07/26/2015   Mixed incontinence urge and stress 07/25/2015   Rotator cuff syndrome of left shoulder 12/19/2014   Difficulty walking 05/06/2012   Stiffness of vertebral column 05/06/2012   Decreased strength 05/06/2012   Class 3 obesity 04/16/2012   MDD (major depressive disorder) 04/16/2012   Essential hypertension, benign 04/16/2012   Back  pain 04/16/2012   Cervical strain, acute 04/16/2012    PCP: Renaye Rakers, MD  REFERRING PROVIDER: Renaye Rakers, MD REFERRING DIAG: R26.81 (ICD-10-CM) - Unsteady gait  THERAPY DIAG:  Other symptoms and signs involving the musculoskeletal system  Difficulty in walking, not elsewhere classified  Unsteady gait  Rationale for Evaluation and Treatment: Rehabilitation  ONSET DATE: 1 year  SUBJECTIVE:   SUBJECTIVE STATEMENT: Patient reports hitting left knee on ironing board which has kept her from performing her HEP but feels better today. Pt states she still feels some imbalance, one fall about 2 weeks ago, but did not hurt her because she fell on towels.   Eval:  Patient reports she has a hard time walking/ keeping her balance sometimes; it's not all the time.  She has to furniture walk in her home sometimes to keep her balance.  Patient seems occasionally confused during subjective today. She states she sometimes walks sideways; seems unsure of any cause of her unbalance.  States she is borderline DM, She does take blood pressure medication.  Some right side shoulder and leg pain.  Daughters or sisters usually come to her appts with her.  She becomes emotional during interview this morning. Right handed; having trouble with controlling right arm BP left arm 150/96 has not taken her BP meds this morning; daughter arrives  at appt. And states her daughters are concerned about her cognition and right side weakness, complaints of pain  PERTINENT HISTORY: Significant depression per her daughter  PAIN:  Are you having pain? Yes: NPRS scale: 8/10 Lt hip Pain location: Lt hip  Pain description: throbbing sharp pains Aggravating factors: leg worse at night Relieving factors: massage, muscle rub, BC powder  PRECAUTIONS: Fall     WEIGHT BEARING RESTRICTIONS: No  FALLS:  Has patient fallen in last 6 months? No  LIVING ENVIRONMENT: Lives with: lives with an adult companion Lives in:  House/apartment Stairs: Yes: External: 5 steps; on right going up, on left going up, and bilateral but cannot reach both Has following equipment at home: Single point cane and Quad cane small base  OCCUPATION: not working  PLOF: Independent  PATIENT GOALS: walk without worrying about falling  NEXT MD VISIT: 11/28/23  OBJECTIVE:  Note: Objective measures were completed at Evaluation unless otherwise noted.  DIAGNOSTIC FINDINGS: none  PATIENT SURVEYS:  LEFS 38/80 47.5%  01/09/24: 44 / 80 = 55.0 %  COGNITION: Overall cognitive status:  some difficulty answering more than yes/no questions; visibly frustrated and starts crying during subjective; improves when her daughter [name] arrives who states her family is concerned about her cognition      SENSATION: Appears intact at eval  EDEMA:  None noted  POSTURE: rounded shoulders, forward head, and heavy chested  PALPATION: Not specifically tested today  LOWER EXTREMITY MMT:  MMT Right eval Left eval  Hip flexion 4 4-  Hip extension 3- 3-  Hip abduction    Hip adduction    Hip internal rotation    Hip external rotation    Knee flexion 4 4-  Knee extension 4+ 4+  Ankle dorsiflexion 4- 5  Ankle plantarflexion    Ankle inversion    Ankle eversion     (Blank rows = not tested)    FUNCTIONAL TESTS:  5 times sit to stand: 27.97 sec using hands to assist up to standin Timed up and go (TUG): 16.29  no AD SLS 3" each leg  01/09/24: 5xSTS: 29 sec, no UE use. TUG: 16 sec  SLS: R: 5", L: 8"  GAIT: Distance walked: 50 ft in clinic Assistive device utilized: None Level of assistance: SBA Comments: touches wall as she walks; sometimes tends to walk with small base of support                                                                                                                                TREATMENT DATE:  01/27/2024  Therapeutic Exercise: -Standing heel/toe raises 2 sets of 10 reps -Standing marches, 5# AW,  1 minute bouts, 2 sets -Standing 3 way hip w 5# AW, 1 sets 5 reps, bilaterally, pt cued for upright trunk and maintaining of neutral spine -Lunge onto bosu ball, 2 sets of 10 reps each LE, pt cued for minimal UE support  -Step  ups, 5 # AW 1 set of 5 bilateral, with left foot more difficult -Lateral stepping 1 lap, 20 feet per lap, pt cued for upright posture -Forward lunges, 2 set of 5 reps better performance going into RLE, pt cued for core activation and upright posture  Therapeutic Activity: -Sit to stands w tidal wave column, 2 sets of 5 reps, pt cued for core activation (second set without tidal wave) -Stair navigation, 2 bouts, 4 stairs, pt cued for minimal UE support (alternating pattern encouraged and able to do but with increased R shoulder and L knee pain)  01/21/24 Standing:  heelraises 20X  Hip abduction with 3# weigh 2X10  Hip extension with 3# weight 2X10  Alternating march with 3# weight 1X10  Vectors 10X3" each with 1 UE assist  Lunge onto step no UE 2X10 each LE  Tandem stance 30" X 2 each LE leading  SLS max Rt: 15", Lt 12" Sit to stands no UE10X Nustep UE/LE 5 minutes seat 6, level 3  01/19/24 Standing:  heelraises 20X  Hp abduction with 3# weigh 2X10  Hip extension with 3# weight 2X10  Alternating march with 3# weight 1X10  Vectors 10X3" each with 1 UE assist  Lunge onto step no UE 2X10 each LE Seated piriformis stretch  30"X2 each Sit to stands no UE10X  01/09/24 Progress Note: 5xSTS, TUG, DGI, SLS, LEFS Review Goals Pt educ on importance of HEP, pacing, and PT recommendations    DGI 01/09/24 1. Gait level surface (1) Moderate Impairment: Walks 20', slow speed, abnormal gait pattern, evidence for imbalance. 2. Change in gait speed (1) Moderate Impairment: Makes only minor adjustments to walking speed, or accomplishes a change in speed with significant gait deviations, or changes speed but has significant gait deviations, or changes speed but loses balance  but is able to recover and continue walking. 3. Gait with horizontal head turns (1) Moderate Impairment: Performs head turns with moderate change in gait velocity, slows down, staggers but recovers, can continue to walk. 4. Gait with vertical head turns 1) Moderate Impairment: Performs head turns with moderate change in gait velocity, slows down, staggers but recovers, can continue to walk. 5. Gait and pivot turn (0) Severe Impairment: Cannot turn safely, requires assistance to turn and stop. 6. Step over obstacle (3) Normal: Is able to step over the box without changing gait speed, no evidence of imbalance. 7. Step around obstacles (3) Normal: Is able to walk around cones safely without changing gait speed; no evidence of imbalance. 8. Stairs (1) Moderate Impairment: Two feet to a stair, must use rail.  TOTAL SCORE: 11 / 24      PATIENT EDUCATION:  Education details: Patient educated on exam findings, POC, scope of PT, HEP, and benefit of using a cane for ambulation outside of home. Person educated: Patient Education method: Explanation, Demonstration, and Handouts Education comprehension: verbalized understanding, returned demonstration, verbal cues required, and tactile cues required  HOME EXERCISE PROGRAM: Access Code: [email or web address] URL: [email or web address] Date: 11/27/2023 Prepared by: [facility]  Exercises - Sit to Stand with Armchair  - 2 x daily - 7 x weekly - 1 sets - 10 reps - standing single leg balance at the counter (try to not hold on)  - 2 x daily - 7 x weekly - 1 sets - 3 reps - 10 sec hold  12/12/23:  Bridge 10x 2 set  ASSESSMENT:  CLINICAL IMPRESSION: Patient continues to demonstrate decreased LE strength, decreased gait quality and balance. Patient  also demonstrates decreased endurance with aerobic based exercise during today's session, a few rest breaks required throughout session. Patient able to progress dynamic balance and core activation  exercises today with side steps and forward lunges on bosu ball. Pt demonstrates decreased LLE strength compared to the right during lunges and stair ambulation. Patient would continue to benefit from skilled physical therapy for increased endurance with ambulation, increased LE strength, and improved balance for improved quality of life, improved independence with gait training and continued progress towards therapy goals.   PN: Continued with focus on improving LE strength and stability.  Added tandem stance and SLS today.  Pt able to complete full 30" with tandem with only intermittent HHA.  Max single leg stance achieved 12-15" with either LE without UE assist.  Pt required less cues and had no episodes of frustration during session today.Did c/o knee pain with sit to stands which dissipated following activity.  Every session has noted improvement in functional and processing abilitiy.  Finished up with nustep to help improve activity tolerance. Pt will continue to benefit from skilled Physical Therapy services to address deficits/limitations in order to improve functional and QOL.     OBJECTIVE IMPAIRMENTS: Abnormal gait, decreased balance, decreased cognition, decreased knowledge of condition, decreased knowledge of use of DME, difficulty walking, decreased strength, increased fascial restrictions, impaired perceived functional ability, and pain.   ACTIVITY LIMITATIONS: bending, standing, squatting, sleeping, stairs, transfers, and locomotion level  PARTICIPATION LIMITATIONS: meal prep, cleaning, laundry, driving, shopping, and community activity  PERSONAL FACTORS: 1 comorbidity: depression  are also affecting patient's functional outcome.   REHAB POTENTIAL: Good  CLINICAL DECISION MAKING: Evolving/moderate complexity  EVALUATION COMPLEXITY: Moderate   GOALS: Goals reviewed with patient? No  SHORT TERM GOALS: Target date: 01/23/2024 patient will be independent with initial  HEP  Baseline: Goal status: IN PROGRESS  2.  Patient will improve SLS to 10" each leg to demonstrate improved functional balance Baseline:  Goal status: IN PROGRESS, ADJUSTED ON 01/09/24  LONG TERM GOALS: Target date: 02/06/24  Patient will be independent in self management strategies to improve quality of life and functional outcomes.  Baseline:  Goal status: IN PROGRESS  2.  Patient will report 50% improvement overall  Baseline:  Goal status: IN PROGRESS  3.  Patient will improve SLS to 15" each to demonstrate improved functional balance Baseline: 3" each Goal status: IN PROGRESS, ADJUSTED ON 01/09/24  4.  Patient will increase  bilateral leg MMT's to 4+ to 5/5 to allow navigation of steps without gait deviation or loss of balance  Baseline: see above Goal status: IN PROGRESS  5.  Patient will improve her TUG to 10" or less to demonstrate improved functional mobility and decreased fall risk Baseline: 16.29 sec Goal status: IN PROGRESS  6.  Patient will improve 5 times sit to stand score to 15 sec or less to demonstrate improved functional mobility and increased leg strength.    Baseline: 27.97 Goal status: IN PROGRESS   PLAN:  PT FREQUENCY: 2x/week  PT DURATION: 4 weeks  PLANNED INTERVENTIONS: 97164- PT Re-evaluation, 97110-Therapeutic exercises, 97530- Therapeutic activity, 97112- Neuromuscular re-education, 97535- Self Care, [id number]- Manual therapy, [phone]- Gait training, [phone]- Orthotic Fit/training, [phone]- Canalith repositioning, [id number]- Aquatic Therapy, [phone]- Splinting, Patient/Family education, Balance training, Stair training, Taping, Dry Needling, Joint mobilization, Joint manipulation, Spinal manipulation, Spinal mobilization, Scar mobilization, and DME instructions.   PLAN FOR NEXT SESSION:  Requires demonstration, one step commands.  Easily frustrated and emotional at times. Progress balance  training, strength training of legs; progress HEP; seeing OT for  right shoulder.     Luz Lex, PT, DPT [facility] Office: [phone] 2:29 PM, 01/27/24

## 2024-01-29 ENCOUNTER — Encounter (HOSPITAL_COMMUNITY)

## 2024-01-29 ENCOUNTER — Ambulatory Visit (HOSPITAL_COMMUNITY): Admitting: Physical Therapy

## 2024-01-29 DIAGNOSIS — R262 Difficulty in walking, not elsewhere classified: Secondary | ICD-10-CM

## 2024-01-29 DIAGNOSIS — R2681 Unsteadiness on feet: Secondary | ICD-10-CM

## 2024-01-29 DIAGNOSIS — M25611 Stiffness of right shoulder, not elsewhere classified: Secondary | ICD-10-CM | POA: Diagnosis not present

## 2024-01-29 DIAGNOSIS — R29898 Other symptoms and signs involving the musculoskeletal system: Secondary | ICD-10-CM

## 2024-01-29 DIAGNOSIS — M25511 Pain in right shoulder: Secondary | ICD-10-CM | POA: Diagnosis not present

## 2024-01-29 NOTE — Therapy (Signed)
 OUTPATIENT PHYSICAL THERAPY LOWER EXTREMITY TREATMENT   Patient Name: Sheryl [name] MRN: [id number] DOB:1956/10/21, 68 y.o., female Today's Date: 01/29/2024  END OF SESSION:  PT End of Session - 01/29/24 1150     Visit Number 10    Number of Visits 14    Date for PT Re-Evaluation 02/06/24    Authorization Type [employer] Dual Complete    Authorization Time Period no auth required    Progress Note Due on Visit 16    PT Start Time 1104    PT Stop Time 1148    PT Time Calculation (min) 44 min    Equipment Utilized During Treatment Gait belt    Activity Tolerance Patient tolerated treatment well    Behavior During Therapy WFL for tasks assessed/performed;Anxious                 Past Medical History:  Diagnosis Date   Chronic back pain 10/22/2011   Chronic neck pain 10/22/2011   Depression    HLD (hyperlipidemia)    Hypertension    Past Surgical History:  Procedure Laterality Date   BREAST CYST EXCISION     right   COLONOSCOPY     per patient, many years ago at [facility]   COLONOSCOPY WITH PROPOFOL N/A 11/16/2021   Procedure: COLONOSCOPY WITH PROPOFOL;  Surgeon: Corbin Ade, MD;  Location: [facility];  Service: Endoscopy;  Laterality: N/A;  9:15am   POLYPECTOMY  11/16/2021   Procedure: POLYPECTOMY;  Surgeon: Corbin Ade, MD;  Location: [facility];  Service: Endoscopy;;   TUBAL LIGATION     Patient Active Problem List   Diagnosis Date Noted   History of wheezing 11/15/2021   Dental abscess 11/15/2021   Colon cancer screening 09/03/2021   Pain, dental 10/12/2020   Hyperlipemia 09/07/2020   Borderline diabetes 09/06/2020   Bullous impetigo 07/26/2015   Mixed incontinence urge and stress 07/25/2015   Rotator cuff syndrome of left shoulder 12/19/2014   Difficulty walking 05/06/2012   Stiffness of vertebral column 05/06/2012   Decreased strength 05/06/2012   Class 3 obesity 04/16/2012   MDD (major depressive disorder) 04/16/2012   Essential  hypertension, benign 04/16/2012   Back pain 04/16/2012   Cervical strain, acute 04/16/2012    PCP: Renaye Rakers, MD  REFERRING PROVIDER: Renaye Rakers, MD REFERRING DIAG: R26.81 (ICD-10-CM) - Unsteady gait  THERAPY DIAG:  Other symptoms and signs involving the musculoskeletal system  Unsteady gait  Difficulty in walking, not elsewhere classified  Rationale for Evaluation and Treatment: Rehabilitation  ONSET DATE: 1 year  SUBJECTIVE:   SUBJECTIVE STATEMENT: 01/29/24: Patient reports her LT knee is still a little sore as is her Rt shoulder.  No falls lately. Ambulating without AD  *Requires demonstration, one step commands.  Easily frustrated and emotional at times.  Seeing OT for RT shoulder also  Eval:  Patient reports she has a hard time walking/ keeping her balance sometimes; it's not all the time.  She has to furniture walk in her home sometimes to keep her balance.  Patient seems occasionally confused during subjective today. She states she sometimes walks sideways; seems unsure of any cause of her unbalance.  States she is borderline DM, She does take blood pressure medication.  Some right side shoulder and leg pain.  Daughters or sisters usually come to her appts with her.  She becomes emotional during interview this morning. Right handed; having trouble with controlling right arm BP left arm 150/96 has not taken  her BP meds this morning; daughter arrives at appt. And states her daughters are concerned about her cognition and right side weakness, complaints of pain  PERTINENT HISTORY: Significant depression per her daughter  PAIN:  Are you having pain? Yes: NPRS scale: 8/10 Lt hip Pain location: Lt hip  Pain description: throbbing sharp pains Aggravating factors: leg worse at night Relieving factors: massage, muscle rub, BC powder  PRECAUTIONS: Fall     WEIGHT BEARING RESTRICTIONS: No  FALLS:  Has patient fallen in last 6 months? No  LIVING ENVIRONMENT: Lives  with: lives with an adult companion Lives in: House/apartment Stairs: Yes: External: 5 steps; on right going up, on left going up, and bilateral but cannot reach both Has following equipment at home: Single point cane and Quad cane small base  OCCUPATION: not working  PLOF: Independent  PATIENT GOALS: walk without worrying about falling  NEXT MD VISIT: 11/28/23  OBJECTIVE:  Note: Objective measures were completed at Evaluation unless otherwise noted.  DIAGNOSTIC FINDINGS: none  PATIENT SURVEYS:  LEFS 38/80 47.5%  01/09/24: 44 / 80 = 55.0 %  COGNITION: Overall cognitive status:  some difficulty answering more than yes/no questions; visibly frustrated and starts crying during subjective; improves when her daughter Sheryl [name] arrives who states her family is concerned about her cognition      SENSATION: Appears intact at eval  EDEMA:  None noted  POSTURE: rounded shoulders, forward head, and heavy chested  PALPATION: Not specifically tested today  LOWER EXTREMITY MMT:  MMT Right eval Left eval  Hip flexion 4 4-  Hip extension 3- 3-  Hip abduction    Hip adduction    Hip internal rotation    Hip external rotation    Knee flexion 4 4-  Knee extension 4+ 4+  Ankle dorsiflexion 4- 5  Ankle plantarflexion    Ankle inversion    Ankle eversion     (Blank rows = not tested)    FUNCTIONAL TESTS:  5 times sit to stand: 27.97 sec using hands to assist up to standin Timed up and go (TUG): 16.29  no AD SLS 3" each leg  01/09/24: 5xSTS: 29 sec, no UE use. TUG: 16 sec  SLS: R: 5", L: 8"  GAIT: Distance walked: 50 ft in clinic Assistive device utilized: None Level of assistance: SBA Comments: touches wall as she walks; sometimes tends to walk with small base of support                                                                                                                                TREATMENT DATE:  01/29/24 Nustep UE/LE 5 minutes seat 6, level 3 Standing:   heelraises 20X  Hip abduction with 4# weight X10  Hip extension with 4# weight X10  Alternating march with 4# weight 1X10  Vectors 10X3" each with 1 UE assist  Lunge onto BOSU ball dome side up no UE X10 each LE  Sit to stands 5 reps, pt cued for core activation, no UE assist Stair negotiation 4" and 7" with 1-2 HR, 2RT each  01/27/2024  Therapeutic Exercise: -Standing heel/toe raises 2 sets of 10 reps -Standing marches, 5# AW, 1 minute bouts, 2 sets -Standing 3 way hip w 5# AW, 1 sets 5 reps, bilaterally, pt cued for upright trunk and maintaining of neutral spine -Lunge onto bosu ball, 2 sets of 10 reps each LE, pt cued for minimal UE support  -Step ups, 5 # AW 1 set of 5 bilateral, with left foot more difficult -Lateral stepping 1 lap, 20 feet per lap, pt cued for upright posture -Forward lunges, 2 set of 5 reps better performance going into RLE, pt cued for core activation and upright posture  Therapeutic Activity: -Sit to stands w tidal wave column, 2 sets of 5 reps, pt cued for core activation (second set without tidal wave) -Stair navigation, 2 bouts, 4 stairs, pt cued for minimal UE support (alternating pattern encouraged and able to do but with increased R shoulder and L knee pain)  01/21/24 Standing:  heelraises 20X  Hip abduction with 3# weigh 2X10  Hip extension with 3# weight 2X10  Alternating march with 3# weight 1X10  Vectors 10X3" each with 1 UE assist  Lunge onto step no UE 2X10 each LE  Tandem stance 30" X 2 each LE leading  SLS max Rt: 15", Lt 12" Sit to stands no UE10X Nustep UE/LE 5 minutes seat 6, level 3  01/19/24 Standing:  heelraises 20X  Hp abduction with 3# weigh 2X10  Hip extension with 3# weight 2X10  Alternating march with 3# weight 1X10  Vectors 10X3" each with 1 UE assist  Lunge onto step no UE 2X10 each LE Seated piriformis stretch  30"X2 each Sit to stands no UE10X  01/09/24 Progress Note: 5xSTS, TUG, DGI, SLS, LEFS Review Goals Pt educ on  importance of HEP, pacing, and PT recommendations    DGI 01/09/24 1. Gait level surface (1) Moderate Impairment: Walks 20', slow speed, abnormal gait pattern, evidence for imbalance. 2. Change in gait speed (1) Moderate Impairment: Makes only minor adjustments to walking speed, or accomplishes a change in speed with significant gait deviations, or changes speed but has significant gait deviations, or changes speed but loses balance but is able to recover and continue walking. 3. Gait with horizontal head turns (1) Moderate Impairment: Performs head turns with moderate change in gait velocity, slows down, staggers but recovers, can continue to walk. 4. Gait with vertical head turns 1) Moderate Impairment: Performs head turns with moderate change in gait velocity, slows down, staggers but recovers, can continue to walk. 5. Gait and pivot turn (0) Severe Impairment: Cannot turn safely, requires assistance to turn and stop. 6. Step over obstacle (3) Normal: Is able to step over the box without changing gait speed, no evidence of imbalance. 7. Step around obstacles (3) Normal: Is able to walk around cones safely without changing gait speed; no evidence of imbalance. 8. Stairs (1) Moderate Impairment: Two feet to a stair, must use rail.  TOTAL SCORE: 11 / 24      PATIENT EDUCATION:  Education details: Patient educated on exam findings, POC, scope of PT, HEP, and benefit of using a cane for ambulation outside of home. Person educated: Patient Education method: Explanation, Demonstration, and Handouts Education comprehension: verbalized understanding, returned demonstration, verbal cues required, and tactile cues required  HOME EXERCISE PROGRAM: Access Code: [email or web address] URL: [email or web address] Date:  11/27/2023 Prepared by: [facility]  Exercises - Sit to Stand with Armchair  - 2 x daily - 7 x weekly - 1 sets - 10 reps - standing single leg balance at the counter (try to  not hold on)  - 2 x daily - 7 x weekly - 1 sets - 3 reps - 10 sec hold  12/12/23:  Bridge 10x 2 set  ASSESSMENT:  CLINICAL IMPRESSION: Continued focus on improving LE strength and stability.  Increased resistance to 4# with hip strengthening and BOSU lunge without UE assist. Pt fatigued after 10 reps of each activity and unable to complete 2 sets today. Worked on both 4" and 7" stairwell today.  Slow, cautious with 4" but able to complete reciprocally both directions using 1 HR without issues.  Pt with extreme difficulty completing 7" stair height with 1 HR, especially with Lt step up and Rt step down.  Encouraged to try and use bil UE when negotiating deeper steps or use step to pattern as her LE's are not quite strong enough and has knee pain with activity. Cues needed, especially for posturing to remain upright with exercises. Tidal tank attempted with sit to stands, however too challenging and pt kept knees extended stressing her LB mm instead of recruiting glut and quads.  Pt only required a few seated rest breaks throughout session.  Patient will continue to benefit from skilled physical therapy for increased endurance with ambulation, increased LE strength, and improved balance for improved quality of life, improved independence with gait training and continued progress towards therapy goals.   PN: Continued with focus on improving LE strength and stability.  Added tandem stance and SLS today.  Pt able to complete full 30" with tandem with only intermittent HHA.  Max single leg stance achieved 12-15" with either LE without UE assist.  Pt required less cues and had no episodes of frustration during session today.Did c/o knee pain with sit to stands which dissipated following activity.  Every session has noted improvement in functional and processing abilitiy.  Finished up with nustep to help improve activity tolerance. Pt will continue to benefit from skilled Physical Therapy services to address  deficits/limitations in order to improve functional and QOL.     OBJECTIVE IMPAIRMENTS: Abnormal gait, decreased balance, decreased cognition, decreased knowledge of condition, decreased knowledge of use of DME, difficulty walking, decreased strength, increased fascial restrictions, impaired perceived functional ability, and pain.   ACTIVITY LIMITATIONS: bending, standing, squatting, sleeping, stairs, transfers, and locomotion level  PARTICIPATION LIMITATIONS: meal prep, cleaning, laundry, driving, shopping, and community activity  PERSONAL FACTORS: 1 comorbidity: depression  are also affecting patient's functional outcome.   REHAB POTENTIAL: Good  CLINICAL DECISION MAKING: Evolving/moderate complexity  EVALUATION COMPLEXITY: Moderate   GOALS: Goals reviewed with patient? No  SHORT TERM GOALS: Target date: 01/23/2024 patient will be independent with initial HEP  Baseline: Goal status: IN PROGRESS  2.  Patient will improve SLS to 10" each leg to demonstrate improved functional balance Baseline:  Goal status: IN PROGRESS, ADJUSTED ON 01/09/24  LONG TERM GOALS: Target date: 02/06/24  Patient will be independent in self management strategies to improve quality of life and functional outcomes.  Baseline:  Goal status: IN PROGRESS  2.  Patient will report 50% improvement overall  Baseline:  Goal status: IN PROGRESS  3.  Patient will improve SLS to 15" each to demonstrate improved functional balance Baseline: 3" each Goal status: IN PROGRESS, ADJUSTED ON 01/09/24  4.  Patient  will increase  bilateral leg MMT's to 4+ to 5/5 to allow navigation of steps without gait deviation or loss of balance  Baseline: see above Goal status: IN PROGRESS  5.  Patient will improve her TUG to 10" or less to demonstrate improved functional mobility and decreased fall risk Baseline: 16.29 sec Goal status: IN PROGRESS  6.  Patient will improve 5 times sit to stand score to 15 sec or less to  demonstrate improved functional mobility and increased leg strength.    Baseline: 27.97 Goal status: IN PROGRESS   PLAN:  PT FREQUENCY: 2x/week  PT DURATION: 4 weeks  PLANNED INTERVENTIONS: 97164- PT Re-evaluation, 97110-Therapeutic exercises, 97530- Therapeutic activity, 97112- Neuromuscular re-education, 97535- Self Care, 16109- Manual therapy, [phone]- Gait training, [phone]- Orthotic Fit/training, [phone]- Canalith repositioning, [id number]- Aquatic Therapy, [phone]- Splinting, Patient/Family education, Balance training, Stair training, Taping, Dry Needling, Joint mobilization, Joint manipulation, Spinal manipulation, Spinal mobilization, Scar mobilization, and DME instructions.   PLAN FOR NEXT SESSION:   Progress balance training, strength training of legs; progress HEP as able  [name], PTA/CLT [facility] Ph: [phone]  11:50 AM, 01/29/24

## 2024-02-03 ENCOUNTER — Encounter (HOSPITAL_COMMUNITY): Payer: Self-pay

## 2024-02-03 ENCOUNTER — Ambulatory Visit (HOSPITAL_COMMUNITY)

## 2024-02-03 DIAGNOSIS — R262 Difficulty in walking, not elsewhere classified: Secondary | ICD-10-CM | POA: Diagnosis not present

## 2024-02-03 DIAGNOSIS — R2681 Unsteadiness on feet: Secondary | ICD-10-CM | POA: Diagnosis not present

## 2024-02-03 DIAGNOSIS — M25611 Stiffness of right shoulder, not elsewhere classified: Secondary | ICD-10-CM | POA: Diagnosis not present

## 2024-02-03 DIAGNOSIS — R29898 Other symptoms and signs involving the musculoskeletal system: Secondary | ICD-10-CM | POA: Diagnosis not present

## 2024-02-03 DIAGNOSIS — M25511 Pain in right shoulder: Secondary | ICD-10-CM | POA: Diagnosis not present

## 2024-02-03 NOTE — Therapy (Signed)
 OUTPATIENT PHYSICAL THERAPY LOWER EXTREMITY TREATMENT   Patient Name: JERLISA DILIBERTO MRN: [id number] DOB:10-12-1956, 68 y.o., female Today's Date: 02/03/2024  END OF SESSION:  PT End of Session - 02/03/24 1439     Visit Number 11    Number of Visits 14    Date for PT Re-Evaluation 02/06/24    Authorization Type [employer] Dual Complete    Authorization Time Period no auth required    Progress Note Due on Visit 16    PT Start Time 1434    PT Stop Time 1512    PT Time Calculation (min) 38 min    Equipment Utilized During Treatment Gait belt    Activity Tolerance Patient tolerated treatment well    Behavior During Therapy WFL for tasks assessed/performed;Anxious                  Past Medical History:  Diagnosis Date   Chronic back pain 10/22/2011   Chronic neck pain 10/22/2011   Depression    HLD (hyperlipidemia)    Hypertension    Past Surgical History:  Procedure Laterality Date   BREAST CYST EXCISION     right   COLONOSCOPY     per patient, many years ago at [facility]   COLONOSCOPY WITH PROPOFOL N/A 11/16/2021   Procedure: COLONOSCOPY WITH PROPOFOL;  Surgeon: Suzette Espy, MD;  Location: [facility];  Service: Endoscopy;  Laterality: N/A;  9:15am   POLYPECTOMY  11/16/2021   Procedure: POLYPECTOMY;  Surgeon: Suzette Espy, MD;  Location: [facility];  Service: Endoscopy;;   TUBAL LIGATION     Patient Active Problem List   Diagnosis Date Noted   History of wheezing 11/15/2021   Dental abscess 11/15/2021   Colon cancer screening 09/03/2021   Pain, dental 10/12/2020   Hyperlipemia 09/07/2020   Borderline diabetes 09/06/2020   Bullous impetigo 07/26/2015   Mixed incontinence urge and stress 07/25/2015   Rotator cuff syndrome of left shoulder 12/19/2014   Difficulty walking 05/06/2012   Stiffness of vertebral column 05/06/2012   Decreased strength 05/06/2012   Class 3 obesity 04/16/2012   MDD (major depressive disorder) 04/16/2012   Essential  hypertension, benign 04/16/2012   Back pain 04/16/2012   Cervical strain, acute 04/16/2012    PCP: Jonathon Neighbors, MD  REFERRING PROVIDER: Jonathon Neighbors, MD REFERRING DIAG: R26.81 (ICD-10-CM) - Unsteady gait  THERAPY DIAG:  Unsteady gait  Difficulty in walking, not elsewhere classified  Rationale for Evaluation and Treatment: Rehabilitation  ONSET DATE: 1 year  SUBJECTIVE:   SUBJECTIVE STATEMENT: Pt states both legs were sore last night but could be due to way she was laying. Pt states she still feels a little off balance.   *Requires demonstration, one step commands.  Easily frustrated and emotional at times.  Seeing OT for RT shoulder also  Eval:  Patient reports she has a hard time walking/ keeping her balance sometimes; it's not all the time.  She has to furniture walk in her home sometimes to keep her balance.  Patient seems occasionally confused during subjective today. She states she sometimes walks sideways; seems unsure of any cause of her unbalance.  States she is borderline DM, She does take blood pressure medication.  Some right side shoulder and leg pain.  Daughters or sisters usually come to her appts with her.  She becomes emotional during interview this morning. Right handed; having trouble with controlling right arm BP left arm 150/96 has not taken her BP meds this  morning; daughter arrives at appt. And states her daughters are concerned about her cognition and right side weakness, complaints of pain  PERTINENT HISTORY: Significant depression per her daughter  PAIN:  Are you having pain? Yes: NPRS scale: 8/10 Lt hip Pain location: Lt hip  Pain description: throbbing sharp pains Aggravating factors: leg worse at night Relieving factors: massage, muscle rub, BC powder  PRECAUTIONS: Fall     WEIGHT BEARING RESTRICTIONS: No  FALLS:  Has patient fallen in last 6 months? No  LIVING ENVIRONMENT: Lives with: lives with an adult companion Lives in:  House/apartment Stairs: Yes: External: 5 steps; on right going up, on left going up, and bilateral but cannot reach both Has following equipment at home: Single point cane and Quad cane small base  OCCUPATION: not working  PLOF: Independent  PATIENT GOALS: walk without worrying about falling  NEXT MD VISIT: 11/28/23  OBJECTIVE:  Note: Objective measures were completed at Evaluation unless otherwise noted.  DIAGNOSTIC FINDINGS: none  PATIENT SURVEYS:  LEFS 38/80 47.5%  01/09/24: 44 / 80 = 55.0 %  COGNITION: Overall cognitive status:  some difficulty answering more than yes/no questions; visibly frustrated and starts crying during subjective; improves when her daughter [name] arrives who states her family is concerned about her cognition      SENSATION: Appears intact at eval  EDEMA:  None noted  POSTURE: rounded shoulders, forward head, and heavy chested  PALPATION: Not specifically tested today  LOWER EXTREMITY MMT:  MMT Right eval Left eval  Hip flexion 4 4-  Hip extension 3- 3-  Hip abduction    Hip adduction    Hip internal rotation    Hip external rotation    Knee flexion 4 4-  Knee extension 4+ 4+  Ankle dorsiflexion 4- 5  Ankle plantarflexion    Ankle inversion    Ankle eversion     (Blank rows = not tested)    FUNCTIONAL TESTS:  5 times sit to stand: 27.97 sec using hands to assist up to standin Timed up and go (TUG): 16.29  no AD SLS 3" each leg  01/09/24: 5xSTS: 29 sec, no UE use. TUG: 16 sec  SLS: R: 5", L: 8"  GAIT: Distance walked: 50 ft in clinic Assistive device utilized: None Level of assistance: SBA Comments: touches wall as she walks; sometimes tends to walk with small base of support                                                                                                                                TREATMENT DATE:  02/03/2024  Therapeutic Exercise: -Nustep level 2, 5 minutes, pt cued for at least 50 spm -Standing  heel raises 2 sets of 10 reps on 4 inch step -TKE, on 4 inch step with other LE touching floor, 1 set of 8 reps bilateral -Lateral stepping 1 lap, 20 feet per lap, pt cued for upright posture  Neuromuscular  Re-education: -STS, 2 sets of 5 reps, cues for no UE support -Walking with band resistance and pulled by therapist in various directions for corrective balance -Standing balance, with therapist pulling with blue theraband for postural balance -Grapevine cross over walking, 2 laps in parallel bars, multiple verbal cues -Lateral step up and over, 2 sets of 8 reps, with left foot more difficult  01/29/24 Nustep UE/LE 5 minutes seat 6, level 3 Standing:  heelraises 20X  Hip abduction with 4# weight X10  Hip extension with 4# weight X10  Alternating march with 4# weight 1X10  Vectors 10X3" each with 1 UE assist  Lunge onto BOSU ball dome side up no UE X10 each LE Sit to stands 5 reps, pt cued for core activation, no UE assist Stair negotiation 4" and 7" with 1-2 HR, 2RT each  01/27/2024  Therapeutic Exercise: -Standing heel/toe raises 2 sets of 10 reps -Standing marches, 5# AW, 1 minute bouts, 2 sets -Standing 3 way hip w 5# AW, 1 sets 5 reps, bilaterally, pt cued for upright trunk and maintaining of neutral spine -Lunge onto bosu ball, 2 sets of 10 reps each LE, pt cued for minimal UE support  -Step ups, 5 # AW 1 set of 5 bilateral, with left foot more difficult -Lateral stepping 1 lap, 20 feet per lap, pt cued for upright posture -Forward lunges, 2 set of 5 reps better performance going into RLE, pt cued for core activation and upright posture  Therapeutic Activity: -Sit to stands w tidal wave column, 2 sets of 5 reps, pt cued for core activation (second set without tidal wave) -Stair navigation, 2 bouts, 4 stairs, pt cued for minimal UE support (alternating pattern encouraged and able to do but with increased R shoulder and L knee pain)  01/21/24 Standing:  heelraises 20X  Hip  abduction with 3# weigh 2X10  Hip extension with 3# weight 2X10  Alternating march with 3# weight 1X10  Vectors 10X3" each with 1 UE assist  Lunge onto step no UE 2X10 each LE  Tandem stance 30" X 2 each LE leading  SLS max Rt: 15", Lt 12" Sit to stands no UE10X Nustep UE/LE 5 minutes seat 6, level 3  01/19/24 Standing:  heelraises 20X  Hp abduction with 3# weigh 2X10  Hip extension with 3# weight 2X10  Alternating march with 3# weight 1X10  Vectors 10X3" each with 1 UE assist  Lunge onto step no UE 2X10 each LE Seated piriformis stretch  30"X2 each Sit to stands no UE10X  01/09/24 Progress Note: 5xSTS, TUG, DGI, SLS, LEFS Review Goals Pt educ on importance of HEP, pacing, and PT recommendations    DGI 01/09/24 1. Gait level surface (1) Moderate Impairment: Walks 20', slow speed, abnormal gait pattern, evidence for imbalance. 2. Change in gait speed (1) Moderate Impairment: Makes only minor adjustments to walking speed, or accomplishes a change in speed with significant gait deviations, or changes speed but has significant gait deviations, or changes speed but loses balance but is able to recover and continue walking. 3. Gait with horizontal head turns (1) Moderate Impairment: Performs head turns with moderate change in gait velocity, slows down, staggers but recovers, can continue to walk. 4. Gait with vertical head turns 1) Moderate Impairment: Performs head turns with moderate change in gait velocity, slows down, staggers but recovers, can continue to walk. 5. Gait and pivot turn (0) Severe Impairment: Cannot turn safely, requires assistance to turn and stop. 6. Step over obstacle (  3) Normal: Is able to step over the box without changing gait speed, no evidence of imbalance. 7. Step around obstacles (3) Normal: Is able to walk around cones safely without changing gait speed; no evidence of imbalance. 8. Stairs (1) Moderate Impairment: Two feet to a stair, must use  rail.  TOTAL SCORE: 11 / 24      PATIENT EDUCATION:  Education details: Patient educated on exam findings, POC, scope of PT, HEP, and benefit of using a cane for ambulation outside of home. Person educated: Patient Education method: Explanation, Demonstration, and Handouts Education comprehension: verbalized understanding, returned demonstration, verbal cues required, and tactile cues required  HOME EXERCISE PROGRAM: Access Code: [email or web address] URL: [email or web address] Date: 11/27/2023 Prepared by: [facility]  Exercises - Sit to Stand with Armchair  - 2 x daily - 7 x weekly - 1 sets - 10 reps - standing single leg balance at the counter (try to not hold on)  - 2 x daily - 7 x weekly - 1 sets - 3 reps - 10 sec hold  12/12/23:  Bridge 10x 2 set  ASSESSMENT:  CLINICAL IMPRESSION: Patient continues to demonstrate decreased LE strength, decreased gait quality and balance. Patient also demonstrates decreased ability to weight bear through LLE during knee flexion due to knee pain and weakness. Patient able to progress dynamic balance with grapevine side stepping variation although pt did get upset trying to learn movement. Pt requires multiple verbal cues and demonstrations for proper form for most exercises. Patient would continue to benefit from skilled physical therapy for increased endurance with ambulation, increased LE strength, and improved balance for improved quality of life, improved independence with gait training and continued progress towards therapy goals.    PN: Continued with focus on improving LE strength and stability.  Added tandem stance and SLS today.  Pt able to complete full 30" with tandem with only intermittent HHA.  Max single leg stance achieved 12-15" with either LE without UE assist.  Pt required less cues and had no episodes of frustration during session today.Did c/o knee pain with sit to stands which dissipated following activity.  Every session has  noted improvement in functional and processing abilitiy.  Finished up with nustep to help improve activity tolerance. Pt will continue to benefit from skilled Physical Therapy services to address deficits/limitations in order to improve functional and QOL.     OBJECTIVE IMPAIRMENTS: Abnormal gait, decreased balance, decreased cognition, decreased knowledge of condition, decreased knowledge of use of DME, difficulty walking, decreased strength, increased fascial restrictions, impaired perceived functional ability, and pain.   ACTIVITY LIMITATIONS: bending, standing, squatting, sleeping, stairs, transfers, and locomotion level  PARTICIPATION LIMITATIONS: meal prep, cleaning, laundry, driving, shopping, and community activity  PERSONAL FACTORS: 1 comorbidity: depression  are also affecting patient's functional outcome.   REHAB POTENTIAL: Good  CLINICAL DECISION MAKING: Evolving/moderate complexity  EVALUATION COMPLEXITY: Moderate   GOALS: Goals reviewed with patient? No  SHORT TERM GOALS: Target date: 01/23/2024 patient will be independent with initial HEP  Baseline: Goal status: IN PROGRESS  2.  Patient will improve SLS to 10" each leg to demonstrate improved functional balance Baseline:  Goal status: IN PROGRESS, ADJUSTED ON 01/09/24  LONG TERM GOALS: Target date: 02/06/24  Patient will be independent in self management strategies to improve quality of life and functional outcomes.  Baseline:  Goal status: IN PROGRESS  2.  Patient will report 50% improvement overall  Baseline:  Goal status: IN PROGRESS  3.  Patient will  improve SLS to 15" each to demonstrate improved functional balance Baseline: 3" each Goal status: IN PROGRESS, ADJUSTED ON 01/09/24  4.  Patient will increase  bilateral leg MMT's to 4+ to 5/5 to allow navigation of steps without gait deviation or loss of balance  Baseline: see above Goal status: IN PROGRESS  5.  Patient will improve her TUG to 10" or  less to demonstrate improved functional mobility and decreased fall risk Baseline: 16.29 sec Goal status: IN PROGRESS  6.  Patient will improve 5 times sit to stand score to 15 sec or less to demonstrate improved functional mobility and increased leg strength.    Baseline: 27.97 Goal status: IN PROGRESS   PLAN:  PT FREQUENCY: 2x/week  PT DURATION: 4 weeks  PLANNED INTERVENTIONS: 97164- PT Re-evaluation, 97110-Therapeutic exercises, 97530- Therapeutic activity, 97112- Neuromuscular re-education, 97535- Self Care, 91478- Manual therapy, [phone]- Gait training, [phone]- Orthotic Fit/training, [phone]- Canalith repositioning, [id number]- Aquatic Therapy, [phone]- Splinting, Patient/Family education, Balance training, Stair training, Taping, Dry Needling, Joint mobilization, Joint manipulation, Spinal manipulation, Spinal mobilization, Scar mobilization, and DME instructions.   PLAN FOR NEXT SESSION:   Progress balance training, strength training of legs; progress HEP as able  Armond Bertin, PT, DPT [facility] Office: [phone] 3:17 PM, 02/03/24

## 2024-02-05 ENCOUNTER — Ambulatory Visit (HOSPITAL_COMMUNITY)

## 2024-02-05 DIAGNOSIS — R29898 Other symptoms and signs involving the musculoskeletal system: Secondary | ICD-10-CM | POA: Diagnosis not present

## 2024-02-05 DIAGNOSIS — R2681 Unsteadiness on feet: Secondary | ICD-10-CM | POA: Diagnosis not present

## 2024-02-05 DIAGNOSIS — R262 Difficulty in walking, not elsewhere classified: Secondary | ICD-10-CM | POA: Diagnosis not present

## 2024-02-05 DIAGNOSIS — M25611 Stiffness of right shoulder, not elsewhere classified: Secondary | ICD-10-CM | POA: Diagnosis not present

## 2024-02-05 DIAGNOSIS — M25511 Pain in right shoulder: Secondary | ICD-10-CM | POA: Diagnosis not present

## 2024-02-05 NOTE — Addendum Note (Signed)
 Addended by: Marysue Sola E on: 02/05/2024 01:30 PM   Modules accepted: Orders

## 2024-02-05 NOTE — Therapy (Addendum)
 OUTPATIENT PHYSICAL THERAPY LOWER EXTREMITY TREATMENT/Progress Note   Patient Name: Sheryl [name] MRN: [id number] DOB:08-Sep-1956, 68 y.o., female Today's Date: 02/05/2024  END OF SESSION:  PT End of Session - 02/05/24 1150     Visit Number 12    Number of Visits 14    Date for PT Re-Evaluation 03/04/24    Authorization Type [employer] Dual Complete    Authorization Time Period no auth required    Progress Note Due on Visit 16    PT Start Time 1149    PT Stop Time 1230    PT Time Calculation (min) 41 min    Equipment Utilized During Treatment Gait belt    Activity Tolerance Patient tolerated treatment well    Behavior During Therapy WFL for tasks assessed/performed;Anxious                  Past Medical History:  Diagnosis Date   Chronic back pain 10/22/2011   Chronic neck pain 10/22/2011   Depression    HLD (hyperlipidemia)    Hypertension    Past Surgical History:  Procedure Laterality Date   BREAST CYST EXCISION     right   COLONOSCOPY     per patient, many years ago at [facility]   COLONOSCOPY WITH PROPOFOL N/A 11/16/2021   Procedure: COLONOSCOPY WITH PROPOFOL;  Surgeon: Corbin Ade, MD;  Location: [facility];  Service: Endoscopy;  Laterality: N/A;  9:15am   POLYPECTOMY  11/16/2021   Procedure: POLYPECTOMY;  Surgeon: Corbin Ade, MD;  Location: [facility];  Service: Endoscopy;;   TUBAL LIGATION     Patient Active Problem List   Diagnosis Date Noted   History of wheezing 11/15/2021   Dental abscess 11/15/2021   Colon cancer screening 09/03/2021   Pain, dental 10/12/2020   Hyperlipemia 09/07/2020   Borderline diabetes 09/06/2020   Bullous impetigo 07/26/2015   Mixed incontinence urge and stress 07/25/2015   Rotator cuff syndrome of left shoulder 12/19/2014   Difficulty walking 05/06/2012   Stiffness of vertebral column 05/06/2012   Decreased strength 05/06/2012   Class 3 obesity 04/16/2012   MDD (major depressive disorder) 04/16/2012    Essential hypertension, benign 04/16/2012   Back pain 04/16/2012   Cervical strain, acute 04/16/2012    PCP: Sheryl Rakers, MD  REFERRING PROVIDER: Renaye Rakers, MD REFERRING DIAG: R26.81 (ICD-10-CM) - Unsteady gait  THERAPY DIAG:  Unsteady gait  Difficulty in walking, not elsewhere classified  Rationale for Evaluation and Treatment: Rehabilitation  ONSET DATE: 1 year  SUBJECTIVE:   SUBJECTIVE STATEMENT: Pt reports some pain L hip and LBP, 5/10. Reports no pain in groin region any more. Felt some tingling in Legs after doing squats at last session.  *Requires demonstration, one step commands.  Easily frustrated and emotional at times.  Seeing OT for RT shoulder also  Eval:  Patient reports she has a hard time walking/ keeping her balance sometimes; it's not all the time.  She has to furniture walk in her home sometimes to keep her balance.  Patient seems occasionally confused during subjective today. She states she sometimes walks sideways; seems unsure of any cause of her unbalance.  States she is borderline DM, She does take blood pressure medication.  Some right side shoulder and leg pain.  Daughters or sisters usually come to her appts with her.  She becomes emotional during interview this morning. Right handed; having trouble with controlling right arm BP left arm 150/96 has not taken her BP  meds this morning; daughter arrives at appt. And states her daughters are concerned about her cognition and right side weakness, complaints of pain  PERTINENT HISTORY: Significant depression per her daughter  PAIN:  Are you having pain? Yes: NPRS scale: 8/10 Lt hip Pain location: Lt hip  Pain description: throbbing sharp pains Aggravating factors: leg worse at night Relieving factors: massage, muscle rub, BC powder  PRECAUTIONS: Fall     WEIGHT BEARING RESTRICTIONS: No  FALLS:  Has patient fallen in last 6 months? No  LIVING ENVIRONMENT: Lives with: lives with an adult  companion Lives in: House/apartment Stairs: Yes: External: 5 steps; on right going up, on left going up, and bilateral but cannot reach both Has following equipment at home: Single point cane and Quad cane small base  OCCUPATION: not working  PLOF: Independent  PATIENT GOALS: walk without worrying about falling  NEXT MD VISIT:   OBJECTIVE:  Note: Objective measures were completed at Evaluation unless otherwise noted.  DIAGNOSTIC FINDINGS: none  PATIENT SURVEYS:  LEFS 38/80 47.5%  01/09/24: 44 / 80 = 55.0 %  02/05/24: Lower Extremity Functional Score: 44 / 80 = 55.0 %  COGNITION: Overall cognitive status:  some difficulty answering more than yes/no questions; visibly frustrated and starts crying during subjective; improves when her daughter Sheryl [name] arrives who states her family is concerned about her cognition      SENSATION: Appears intact at eval  EDEMA:  None noted  POSTURE: rounded shoulders, forward head, and heavy chested  PALPATION: Not specifically tested today  LOWER EXTREMITY MMT:  MMT Right eval Left eval R 02/05/24 L 02/05/24  Hip flexion 4 4- 4+ 4-  Hip extension 3- 3- 4- 3+  Hip abduction      Hip adduction      Hip internal rotation      Hip external rotation      Knee flexion 4 4-    Knee extension 4+ 4+    Ankle dorsiflexion 4- 5    Ankle plantarflexion      Ankle inversion      Ankle eversion       (Blank rows = not tested)    FUNCTIONAL TESTS:  5 times sit to stand: 27.97 sec using hands to assist up to standin Timed up and go (TUG): 16.29  no AD SLS 3" each leg  01/09/24: 5xSTS: 29 sec, no UE use. TUG: 16 sec  SLS: R: 5", L: 8"   02/05/24:  5xSTS: 32" w/ UE use, pt demo inc LLE pain  TUG: 13"  SLS: R: 9" , L: 8"  GAIT: Distance walked: 50 ft in clinic Assistive device utilized: None Level of assistance: SBA Comments: touches wall as she walks; sometimes tends to walk with small base of support  TREATMENT DATE:  02/05/24: Progress Note:  LEFS MMT  SLS 5xSTS   Standing Hip Ext, 2x10, varying UE support to challenge balance Standing hip Abd, 2x10, varying UE support to challenge balance LTR: 10x each side SKTC: 10" holds, 10x each leg  02/03/2024  Therapeutic Exercise: -Nustep level 2, 5 minutes, pt cued for at least 50 spm -Standing heel raises 2 sets of 10 reps on 4 inch step -TKE, on 4 inch step with other LE touching floor, 1 set of 8 reps bilateral -Lateral stepping 1 lap, 20 feet per lap, pt cued for upright posture  Neuromuscular Re-education: -STS, 2 sets of 5 reps, cues for no UE support -Walking with band resistance and pulled by therapist in various directions for corrective balance -Standing balance, with therapist pulling with blue theraband for postural balance -Grapevine cross over walking, 2 laps in parallel bars, multiple verbal cues -Lateral step up and over, 2 sets of 8 reps, with left foot more difficult  01/29/24 Nustep UE/LE 5 minutes seat 6, level 3 Standing:  heelraises 20X  Hip abduction with 4# weight X10  Hip extension with 4# weight X10  Alternating march with 4# weight 1X10  Vectors 10X3" each with 1 UE assist  Lunge onto BOSU ball dome side up no UE X10 each LE Sit to stands 5 reps, pt cued for core activation, no UE assist Stair negotiation 4" and 7" with 1-2 HR, 2RT each    DGI 01/09/24 1. Gait level surface (1) Moderate Impairment: Walks 20', slow speed, abnormal gait pattern, evidence for imbalance. 2. Change in gait speed (1) Moderate Impairment: Makes only minor adjustments to walking speed, or accomplishes a change in speed with significant gait deviations, or changes speed but has significant gait deviations, or changes speed but loses balance but is able to recover and continue walking. 3. Gait with horizontal head turns (1) Moderate  Impairment: Performs head turns with moderate change in gait velocity, slows down, staggers but recovers, can continue to walk. 4. Gait with vertical head turns 1) Moderate Impairment: Performs head turns with moderate change in gait velocity, slows down, staggers but recovers, can continue to walk. 5. Gait and pivot turn (0) Severe Impairment: Cannot turn safely, requires assistance to turn and stop. 6. Step over obstacle (3) Normal: Is able to step over the box without changing gait speed, no evidence of imbalance. 7. Step around obstacles (3) Normal: Is able to walk around cones safely without changing gait speed; no evidence of imbalance. 8. Stairs (1) Moderate Impairment: Two feet to a stair, must use rail.  TOTAL SCORE: 11 / 24      PATIENT EDUCATION:  Education details: Patient educated on exam findings, POC, scope of PT, HEP, and benefit of using a cane for ambulation outside of home. Person educated: Patient Education method: Explanation, Demonstration, and Handouts Education comprehension: verbalized understanding, returned demonstration, verbal cues required, and tactile cues required  HOME EXERCISE PROGRAM: Access Code: [email or web address] URL: [email or web address] Date: 11/27/2023 Prepared by: [facility]  Exercises - Sit to Stand with Armchair  - 2 x daily - 7 x weekly - 1 sets - 10 reps - standing single leg balance at the counter (try to not hold on)  - 2 x daily - 7 x weekly - 1 sets - 3 reps - 10 sec hold  12/12/23:  Bridge 10x 2 set  ASSESSMENT:  CLINICAL IMPRESSION: PT Re-eval/Progress Note performed on this date. Patient demonstrating improvements in TUG score and SLS  time, indicating decreased fall risk. Mild improvements in LE strength. Patient becomes emotional during session completing LEFS, when speaking about how difficult things are for her to do at home. With encouragement, patient agreeable to complete session. Remainder of session spent  performing LE strength in standing positions w/ varying UE support to challenge balance, with inc reports of LLE pain and multiple rest breaks needed. Pivoted to LB mobility due to pain level. Patient tolerated well. Pt would continue to benefit from skilled physical therapy for increased endurance, increased LE strength, and improved balance for improved QOL, improved gait and continued progress towards goals.    PN: Continued with focus on improving LE strength and stability.  Added tandem stance and SLS today.  Pt able to complete full 30" with tandem with only intermittent HHA.  Max single leg stance achieved 12-15" with either LE without UE assist.  Pt required less cues and had no episodes of frustration during session today.Did c/o knee pain with sit to stands which dissipated following activity.  Every session has noted improvement in functional and processing abilitiy.  Finished up with nustep to help improve activity tolerance. Pt will continue to benefit from skilled Physical Therapy services to address deficits/limitations in order to improve functional and QOL.     OBJECTIVE IMPAIRMENTS: Abnormal gait, decreased balance, decreased cognition, decreased knowledge of condition, decreased knowledge of use of DME, difficulty walking, decreased strength, increased fascial restrictions, impaired perceived functional ability, and pain.   ACTIVITY LIMITATIONS: bending, standing, squatting, sleeping, stairs, transfers, and locomotion level  PARTICIPATION LIMITATIONS: meal prep, cleaning, laundry, driving, shopping, and community activity  PERSONAL FACTORS: 1 comorbidity: depression  are also affecting patient's functional outcome.   REHAB POTENTIAL: Good  CLINICAL DECISION MAKING: Evolving/moderate complexity  EVALUATION COMPLEXITY: Moderate   GOALS: Goals reviewed with patient? No  SHORT TERM GOALS: Target date: 02/19/2024 patient will be independent with initial HEP  Baseline: Goal  status: IN PROGRESS  2.  Patient will improve SLS to 10" each leg to demonstrate improved functional balance Baseline:  Goal status: IN PROGRESS, ADJUSTED ON 01/09/24  LONG TERM GOALS: Target date: 03/04/24  Patient will be independent in self management strategies to improve quality of life and functional outcomes.  Baseline:  Goal status: IN PROGRESS  2.  Patient will report 50% improvement overall  Baseline:  Goal status: IN PROGRESS  3.  Patient will improve SLS to 15" each to demonstrate improved functional balance Baseline: 3" each Goal status: IN PROGRESS, ADJUSTED ON 01/09/24  4.  Patient will increase  bilateral leg MMT's to 4+ to 5/5 to allow navigation of steps without gait deviation or loss of balance  Baseline: see above Goal status: IN PROGRESS  5.  Patient will improve her TUG to 10" or less to demonstrate improved functional mobility and decreased fall risk Baseline: 16.29 sec Goal status: IN PROGRESS  6.  Patient will improve 5 times sit to stand score to 15 sec or less to demonstrate improved functional mobility and increased leg strength.    Baseline: 27.97 Goal status: IN PROGRESS   PLAN:  PT FREQUENCY: 2x/week  PT DURATION: 4 weeks  PLANNED INTERVENTIONS: 97164- PT Re-evaluation, 97110-Therapeutic exercises, 97530- Therapeutic activity, 97112- Neuromuscular re-education, 97535- Self Care, 16109- Manual therapy, [phone]- Gait training, [phone]- Orthotic Fit/training, [phone]- Canalith repositioning, [id number]- Aquatic Therapy, [phone]- Splinting, Patient/Family education, Balance training, Stair training, Taping, Dry Needling, Joint mobilization, Joint manipulation, Spinal manipulation, Spinal mobilization, Scar mobilization, and DME instructions.   PLAN FOR  NEXT SESSION:   Progress balance training, strength training of legs; progress HEP as able  1:27 PM, 02/05/24 Tyteanna Ost Powell-Butler, PT, DPT [facility] [place]

## 2024-02-06 ENCOUNTER — Encounter (HOSPITAL_COMMUNITY): Admitting: Occupational Therapy

## 2024-02-10 ENCOUNTER — Encounter (HOSPITAL_COMMUNITY)

## 2024-02-10 ENCOUNTER — Ambulatory Visit (HOSPITAL_COMMUNITY): Admitting: Occupational Therapy

## 2024-02-10 ENCOUNTER — Encounter (HOSPITAL_COMMUNITY): Payer: Self-pay | Admitting: Occupational Therapy

## 2024-02-10 DIAGNOSIS — M25611 Stiffness of right shoulder, not elsewhere classified: Secondary | ICD-10-CM

## 2024-02-10 DIAGNOSIS — M25511 Pain in right shoulder: Secondary | ICD-10-CM | POA: Diagnosis not present

## 2024-02-10 DIAGNOSIS — R29898 Other symptoms and signs involving the musculoskeletal system: Secondary | ICD-10-CM

## 2024-02-10 DIAGNOSIS — R262 Difficulty in walking, not elsewhere classified: Secondary | ICD-10-CM | POA: Diagnosis not present

## 2024-02-10 DIAGNOSIS — R2681 Unsteadiness on feet: Secondary | ICD-10-CM | POA: Diagnosis not present

## 2024-02-10 NOTE — Therapy (Addendum)
 OUTPATIENT OCCUPATIONAL THERAPY ORTHO TREATMENT NOTE REASSESSMENT AND DISCHARGE  Patient Name: Sheryl [name] MRN: [id number] DOB:[birth year], 68 y.o., female Today's Date: 02/10/2024    OCCUPATIONAL THERAPY DISCHARGE SUMMARY  Visits from Start of Care: 8  Current functional level related to goals / functional outcomes: See below. Pt has made minimal progress during therapy, poor carry over of HEP during gap in services. Recommend follow up with orthopedic MD for assessment.    Remaining deficits: Pain, decreased ROM and strength, decreased functional use of the RUE.    Education / Equipment: HEP for A/ROM   Patient agrees to discharge. Patient goals were not met. Patient is being discharged due to lack of progress..     END OF SESSION:  OT End of Session - 02/10/24 1049     Visit Number 8    Number of Visits 8    Date for OT Re-Evaluation 02/10/24    Authorization Type [employer] Dual Complete    OT Start Time 1015    OT Stop Time 1048    OT Time Calculation (min) 33 min    Activity Tolerance Patient tolerated treatment well    Behavior During Therapy WFL for tasks assessed/performed;Anxious                Past Medical History:  Diagnosis Date   Chronic back pain 10/22/2011   Chronic neck pain 10/22/2011   Depression    HLD (hyperlipidemia)    Hypertension    Past Surgical History:  Procedure Laterality Date   BREAST CYST EXCISION     right   COLONOSCOPY     per patient, many years ago at [facility]   COLONOSCOPY WITH PROPOFOL  N/A 11/16/2021   Procedure: COLONOSCOPY WITH PROPOFOL ;  Surgeon: Suzette Espy, MD;  [facility];  Service: Endoscopy;  Laterality: N/A;  9:15am   POLYPECTOMY  11/16/2021   Procedure: POLYPECTOMY;  Surgeon: Suzette Espy, MD;  [facility];  Service: Endoscopy;;   TUBAL LIGATION     Patient Active Problem List   Diagnosis Date Noted   History of wheezing 11/15/2021   Dental abscess 11/15/2021    Colon cancer screening 09/03/2021   Pain, dental 10/12/2020   Hyperlipemia 09/07/2020   Borderline diabetes 09/06/2020   Bullous impetigo 07/26/2015   Mixed incontinence urge and stress 07/25/2015   Rotator cuff syndrome of left shoulder 12/19/2014   Difficulty walking 05/06/2012   Stiffness of vertebral column 05/06/2012   Decreased strength 05/06/2012   Class 3 obesity 04/16/2012   MDD (major depressive disorder) 04/16/2012   Essential hypertension, benign 04/16/2012   Back pain 04/16/2012   Cervical strain, acute 04/16/2012    PCP: Jonathon Neighbors, MD REFERRING PROVIDER: Jonathon Neighbors, MD  ONSET DATE: ~9 months  REFERRING DIAG: M79.601 (ICD-10-CM) - Right arm pain   THERAPY DIAG:  Other symptoms and signs involving the musculoskeletal system - Plan: Ot plan of care cert/re-cert  Right shoulder pain, unspecified chronicity - Plan: Ot plan of care cert/re-cert  Shoulder stiffness, right - Plan: Ot plan of care cert/re-cert  Rationale for Evaluation and Treatment: Rehabilitation  SUBJECTIVE:   SUBJECTIVE STATEMENT: S: "I haven't seen much change."  Pt accompanied by: self and family member  PERTINENT HISTORY: Pt reports that she has been having difficulty with controlling and mobilizing her RUE "for a while" and reports that pain has been significantly increasing. There has been no referral to an Orthopedic at this time. Pt also  presenting with balance deficits and periods of confusion/difficulty answering questions, where she becomes frustrated and starts to tear up.   PRECAUTIONS: None  WEIGHT BEARING RESTRICTIONS: No  PAIN:  Are you having pain? Yes: NPRS scale: 7/10 Pain location: all over RUE Pain description: aching Aggravating factors: movement Relieving factors: unsure  FALLS: Has patient fallen in last 6 months? No  PLOF: Independent  PATIENT GOALS: "To reduce pain"  NEXT MD VISIT:   OBJECTIVE:   HAND DOMINANCE: Right  ADLs: Overall ADLs: Pain  limits her tolerance to completing ADL's and IADL's efficiently. She uses her LUE more, as well as compensatory strategies.  FUNCTIONAL OUTCOME MEASURES: Quick Dash: 45.45 02/10/24: 40.91  UPPER EXTREMITY ROM:       Assessed in seated, er/IR adducted  Active ROM Right eval Right 02/10/24  Shoulder flexion 141 138  Shoulder abduction 139 145  Shoulder internal rotation 90 90  Shoulder external rotation 9 43  (Blank rows = not tested)    UPPER EXTREMITY MMT:     Assessed in seated, er/IR adducted  MMT Right eval Right 02/10/24  Shoulder flexion 4-/5 4/5 (with pain)  Shoulder abduction 4/5 4+/5 (with pain)  Shoulder internal rotation 4+/5 5/5  Shoulder external rotation 4/5 (w/ pain) 4+/5 (with pain)  (Blank rows = not tested)  HAND FUNCTION: Grip strength: Right: 29 lbs; Left: 29 lbs  SENSATION: WFL  EDEMA: Pt reports sometimes the upper arm feels swollen.  COGNITION: Overall cognitive status:  Pt emotional with questions, requiring increased time to answer and at times appears to have difficulty understanding the questions.  Areas of impairment: Awareness: Deficits Poor awareness of all deficits Behavior: Poor frustration tolerance and Lability  OBSERVATIONS: Moderate fascial restrictions along deltoid, trapezius, and biceps.    TODAY'S TREATMENT:                                                                                                                              DATE:  02/10/24 -A/ROM: standing-protraction, flexion, abduction, er, horizontal abduction, 10 reps  12/24/23 -Pulleys: flexion, abduction, x60" -A/ROM: seated, flexion, abduction, horizontal abduction, protraction, er/IR, x12 -X to V arms, x12 -Goal Post Arms, x12 -Functional reaching: 2# wrist weight, 10 cones, from counter to 1st to 2nd shelf flexion, repeat in abduction -PNF Strengthening: Red band, chest pulls, overhead pulls, PNF up, PNF down, er pulls, x10  12/22/23 -Manual therapy:  Myofascial release and trigger point applied to biceps, trapezius, deltoid, and scapular region in order to reduce pain and fascial restriction, as well as improve ROM.  -A/ROM: seated, flexion, abduction, Protraction, horizontal abduction, er/IR, x12 -X to V arms x12 -Goal post arms x12 -Proximal Shoulder Exercises: paddles, criss cross, circles both directions, x10 -Scapular Strengthening: red band, extension, retraction, rows, x15 -Shoulder Strengthening: red band, horizontal abduction, er, IR, x15    PATIENT EDUCATION: Education details: A/ROM Person educated: Patient and Child(ren) Education method: Explanation, Demonstration, and Handouts Education comprehension: verbalized understanding  and returned demonstration  HOME EXERCISE PROGRAM: 2/6: Pendulums and Table Slides 2/10: AA/ROM, Isometrics 2/14: A/ROM  2/21: Scapular strengthening 2/24: [place]aball Exercises 3/5: PNF Strengthening 02/10/24-additional copy of A/ROM provided  GOALS: Goals reviewed with patient? Yes   SHORT TERM GOALS: Target date: 01/02/24  Pt will be provided with and educated on HEP to improve mobility in RUE required for use during ADL completion.   Goal status: NOT MET  Pt will decrease pain in RUE to 3/10 or less to improve ability to sleep for 2+ consecutive hours without waking due to pain.   Goal status: NOT MET  2.  Pt will decrease RUE fascial restrictions to min amounts or less to improve mobility required for functional reaching tasks.   Goal status: NOT MET  3.  Pt will increase RUE A/ROM by 20 degrees to improve ability to use RUE when reaching overhead or behind back during dressing and bathing tasks.   Goal status: PARTIALLY MET  4.  Pt will increase RUE strength to 4+/5 or greater to improve ability to use RUE when lifting or carrying items during meal preparation/housework/yardwork tasks.   Goal status: PARTIALLY MET  5.  Pt will return to highest level of function using RUE as  dominant during functional task completion.   Goal status: NOT MET   ASSESSMENT:  CLINICAL IMPRESSION: Pt returns for first session since, 12/23/33. Reassessment completed, pt reports she has not been completing her HEP as she lost all the papers, and has not noticed any changes in her arm, both when she was attending therapy regularly and during the break. Pt has partially met 2/6 goals, remaining goals have not been met. Pt reports she is using her LUE for a lot of tasks to give her RUE a break. Pt with minimal changes in A/ROM, however er has improved by ~30 degrees. Strength has improved since initial evaluation. Recommend discharge from OT services and pt to request a referral to an orthopedic MD for further assessment of the RUE. Pt agreeable to plan and verbalized understanding of A/ROM exercises to continue for HEP. Increased time for review and comprehension of HEP.   PERFORMANCE DEFICITS: in functional skills including in functional skills including ADLs, IADLs, coordination, tone, ROM, strength, pain, fascial restrictions, muscle spasms, and UE functional use, cognitive skills including emotional, memory, and safety awareness, and psychosocial skills including coping strategies, interpersonal interactions, and routines and behaviors.    PLAN:  OT FREQUENCY: 2x/week  OT DURATION: 4 weeks  PLANNED INTERVENTIONS: 97168 OT Re-evaluation, 97535 self care/ADL training, 16109 therapeutic exercise, 97530 therapeutic activity, 97112 neuromuscular re-education, 97140 manual therapy, 97035 ultrasound, 97010 moist heat, 97032 electrical stimulation (manual), passive range of motion, functional mobility training, energy conservation, coping strategies training, patient/family education, and DME and/or AE instructions  RECOMMENDED OTHER SERVICES: Orthopedic MD referral  CONSULTED AND AGREED WITH PLAN OF CARE: Patient and family member/caregiver  PLAN FOR NEXT SESSION: N/A-discharge  today   Lafonda Piety, OTR/L  [phone] 02/10/2024, 3:16 PM

## 2024-02-10 NOTE — Addendum Note (Signed)
 Addended by: Diksha Tagliaferro A on: 02/10/2024 03:16 PM   Modules accepted: Orders

## 2024-02-10 NOTE — Patient Instructions (Signed)
 Repeat all exercises 10-15 times, 1-2 times per day.  1) Shoulder Protraction    Begin with elbows by your side, slowly "punch" straight out in front of you.      2) Shoulder Flexion Standing:         Begin with arms at your side with thumbs pointed up, slowly raise both arms up and forward towards overhead.               3) Horizontal abduction/adduction  Standing:           Begin with arms straight out in front of you, bring out to the side in at "T" shape. Keep arms straight entire time.                 4) Internal & External Rotation  Standing:     Stand with elbows at the side and elbows bent 90 degrees. Move your forearms away from your body, then bring back inward toward the body.     5) Shoulder Abduction  Standing:       Lying on your back begin with your arms flat on the table next to your side. Slowly move your arms out to the side so that they go overhead, in a jumping jack or snow angel movement.

## 2024-02-12 ENCOUNTER — Encounter (HOSPITAL_COMMUNITY): Payer: Self-pay

## 2024-02-12 ENCOUNTER — Ambulatory Visit (HOSPITAL_COMMUNITY)

## 2024-02-12 DIAGNOSIS — M25611 Stiffness of right shoulder, not elsewhere classified: Secondary | ICD-10-CM | POA: Diagnosis not present

## 2024-02-12 DIAGNOSIS — M25511 Pain in right shoulder: Secondary | ICD-10-CM | POA: Diagnosis not present

## 2024-02-12 DIAGNOSIS — R29898 Other symptoms and signs involving the musculoskeletal system: Secondary | ICD-10-CM | POA: Diagnosis not present

## 2024-02-12 DIAGNOSIS — R2681 Unsteadiness on feet: Secondary | ICD-10-CM

## 2024-02-12 DIAGNOSIS — R262 Difficulty in walking, not elsewhere classified: Secondary | ICD-10-CM

## 2024-02-12 NOTE — Therapy (Signed)
 OUTPATIENT PHYSICAL THERAPY LOWER EXTREMITY TREATMENT/Progress Note   Patient Name: Sheryl [name] MRN: [id number] DOB:[birth year], 68 y.o., female Today's Date: 02/12/2024  END OF SESSION:  PT End of Session - 02/12/24 1151     Visit Number 13    Number of Visits 14    Date for PT Re-Evaluation 03/04/24    Authorization Type [employer] Dual Complete    Authorization Time Period no auth required    Progress Note Due on Visit 16    PT Start Time 1152    PT Stop Time 1230    PT Time Calculation (min) 38 min    Activity Tolerance Patient tolerated treatment well    Behavior During Therapy WFL for tasks assessed/performed;Anxious                  Past Medical History:  Diagnosis Date   Chronic back pain 10/22/2011   Chronic neck pain 10/22/2011   Depression    HLD (hyperlipidemia)    Hypertension    Past Surgical History:  Procedure Laterality Date   BREAST CYST EXCISION     right   COLONOSCOPY     per patient, many years ago at [facility]   COLONOSCOPY WITH PROPOFOL  N/A 11/16/2021   Procedure: COLONOSCOPY WITH PROPOFOL ;  Surgeon: Suzette Espy, MD;  Location: [facility];  Service: Endoscopy;  Laterality: N/A;  9:15am   POLYPECTOMY  11/16/2021   Procedure: POLYPECTOMY;  Surgeon: Suzette Espy, MD;  Location: [facility];  Service: Endoscopy;;   TUBAL LIGATION     Patient Active Problem List   Diagnosis Date Noted   History of wheezing 11/15/2021   Dental abscess 11/15/2021   Colon cancer screening 09/03/2021   Pain, dental 10/12/2020   Hyperlipemia 09/07/2020   Borderline diabetes 09/06/2020   Bullous impetigo 07/26/2015   Mixed incontinence urge and stress 07/25/2015   Rotator cuff syndrome of left shoulder 12/19/2014   Difficulty walking 05/06/2012   Stiffness of vertebral column 05/06/2012   Decreased strength 05/06/2012   Class 3 obesity 04/16/2012   MDD (major depressive disorder) 04/16/2012   Essential hypertension, benign 04/16/2012   Back  pain 04/16/2012   Cervical strain, acute 04/16/2012    PCP: Jonathon Neighbors, MD  REFERRING PROVIDER: Jonathon Neighbors, MD REFERRING DIAG: R26.81 (ICD-10-CM) - Unsteady gait  THERAPY DIAG:  Other symptoms and signs involving the musculoskeletal system  Unsteady gait  Difficulty in walking, not elsewhere classified  Rationale for Evaluation and Treatment: Rehabilitation  ONSET DATE: 1 year  SUBJECTIVE:   SUBJECTIVE STATEMENT: Pt reports no pain today. Reports previous groin pain is gone. Reports church has been helping her mentally and motivating her. Reports she did have some pain last night. Reports she still can't walk long without having to rest, reports very small increments like walking room to room at home.    Eval:  Patient reports she has a hard time walking/ keeping her balance sometimes; it's not all the time.  She has to furniture walk in her home sometimes to keep her balance.  Patient seems occasionally confused during subjective today. She states she sometimes walks sideways; seems unsure of any cause of her unbalance.  States she is borderline DM, She does take blood pressure medication.  Some right side shoulder and leg pain.  Daughters or sisters usually come to her appts with her.  She becomes emotional during interview this morning. Right handed; having trouble with controlling right arm BP left arm 150/96 has  not taken her BP meds this morning; daughter arrives at appt. And states her daughters are concerned about her cognition and right side weakness, complaints of pain  PERTINENT HISTORY: Significant depression per her daughter  PAIN:  Are you having pain? Yes: NPRS scale: 8/10 Lt hip Pain location: Lt hip  Pain description: throbbing sharp pains Aggravating factors: leg worse at night Relieving factors: massage, muscle rub, BC powder  PRECAUTIONS: Fall     WEIGHT BEARING RESTRICTIONS: No  FALLS:  Has patient fallen in last 6 months? No  LIVING  ENVIRONMENT: Lives with: lives with an adult companion Lives in: House/apartment Stairs: Yes: External: 5 steps; on right going up, on left going up, and bilateral but cannot reach both Has following equipment at home: Single point cane and Quad cane small base  OCCUPATION: not working  PLOF: Independent  PATIENT GOALS: walk without worrying about falling  NEXT MD VISIT:   OBJECTIVE:  Note: Objective measures were completed at Evaluation unless otherwise noted.  DIAGNOSTIC FINDINGS: none  PATIENT SURVEYS:  LEFS 38/80 47.5%  01/09/24: 44 / 80 = 55.0 %  02/05/24: Lower Extremity Functional Score: 44 / 80 = 55.0 %  COGNITION: Overall cognitive status:  some difficulty answering more than yes/no questions; visibly frustrated and starts crying during subjective; improves when her daughter [name] arrives who states her family is concerned about her cognition      SENSATION: Appears intact at eval  EDEMA:  None noted  POSTURE: rounded shoulders, forward head, and heavy chested  PALPATION: Not specifically tested today  LOWER EXTREMITY MMT:  MMT Right eval Left eval R 02/05/24 L 02/05/24  Hip flexion 4 4- 4+ 4-  Hip extension 3- 3- 4- 3+  Hip abduction      Hip adduction      Hip internal rotation      Hip external rotation      Knee flexion 4 4-    Knee extension 4+ 4+    Ankle dorsiflexion 4- 5    Ankle plantarflexion      Ankle inversion      Ankle eversion       (Blank rows = not tested)    FUNCTIONAL TESTS:  5 times sit to stand: 27.97 sec using hands to assist up to standin Timed up and go (TUG): 16.29  no AD SLS 3" each leg  01/09/24: 5xSTS: 29 sec, no UE use. TUG: 16 sec  SLS: R: 5", L: 8"   02/05/24:  5xSTS: 32" w/ UE use, pt demo inc LLE pain  TUG: 13"  SLS: R: 9" , L: 8"  GAIT: Distance walked: 50 ft in clinic Assistive device utilized: None Level of assistance: SBA Comments: touches wall as she walks; sometimes tends to walk with small  base of support  TREATMENT DATE:  02/12/24: Treadmill walking to address endurance: 2.5 min, 0.5 mph,  Obstacle Course: 20 ft span, 4 times  Stepping over 6 inch orange obstacle Onto foam pad, 5 mini squats Weaving between 4 cones Step onto foam pad, 5 marches Stepping over orange obstacle Forward march, holding 10 lb. Round tidal tank, 70ft Backward ambulation, holding 10 lb. Round tidal tank, 90ft Side Steps, holding 10 lb. Round tidal tank, 2ftx2  02/05/24: Progress Note:  LEFS MMT  SLS 5xSTS   Standing Hip Ext, 2x10, varying UE support to challenge balance Standing hip Abd, 2x10, varying UE support to challenge balance LTR: 10x each side SKTC: 10" holds, 10x each leg  02/03/2024  Therapeutic Exercise: -Nustep level 2, 5 minutes, pt cued for at least 50 spm -Standing heel raises 2 sets of 10 reps on 4 inch step -TKE, on 4 inch step with other LE touching floor, 1 set of 8 reps bilateral -Lateral stepping 1 lap, 20 feet per lap, pt cued for upright posture  Neuromuscular Re-education: -STS, 2 sets of 5 reps, cues for no UE support -Walking with band resistance and pulled by therapist in various directions for corrective balance -Standing balance, with therapist pulling with blue theraband for postural balance -Grapevine cross over walking, 2 laps in parallel bars, multiple verbal cues -Lateral step up and over, 2 sets of 8 reps, with left foot more difficult  01/29/24 Nustep UE/LE 5 minutes seat 6, level 3 Standing:  heelraises 20X  Hip abduction with 4# weight X10  Hip extension with 4# weight X10  Alternating march with 4# weight 1X10  Vectors 10X3" each with 1 UE assist  Lunge onto BOSU ball dome side up no UE X10 each LE Sit to stands 5 reps, pt cued for core activation, no UE assist Stair negotiation 4" and 7" with 1-2 HR, 2RT  each    DGI 01/09/24 1. Gait level surface (1) Moderate Impairment: Walks 20', slow speed, abnormal gait pattern, evidence for imbalance. 2. Change in gait speed (1) Moderate Impairment: Makes only minor adjustments to walking speed, or accomplishes a change in speed with significant gait deviations, or changes speed but has significant gait deviations, or changes speed but loses balance but is able to recover and continue walking. 3. Gait with horizontal head turns (1) Moderate Impairment: Performs head turns with moderate change in gait velocity, slows down, staggers but recovers, can continue to walk. 4. Gait with vertical head turns 1) Moderate Impairment: Performs head turns with moderate change in gait velocity, slows down, staggers but recovers, can continue to walk. 5. Gait and pivot turn (0) Severe Impairment: Cannot turn safely, requires assistance to turn and stop. 6. Step over obstacle (3) Normal: Is able to step over the box without changing gait speed, no evidence of imbalance. 7. Step around obstacles (3) Normal: Is able to walk around cones safely without changing gait speed; no evidence of imbalance. 8. Stairs (1) Moderate Impairment: Two feet to a stair, must use rail.  TOTAL SCORE: 11 / 24      PATIENT EDUCATION:  Education details: Patient educated on exam findings, POC, scope of PT, HEP, and benefit of using a cane for ambulation outside of home. Person educated: Patient Education method: Explanation, Demonstration, and Handouts Education comprehension: verbalized understanding, returned demonstration, verbal cues required, and tactile cues required  HOME EXERCISE PROGRAM: Access Code: [email or web address] URL: [email or web address] Date: 11/27/2023 Prepared by: [facility]  Exercises - Sit to Stand with Armchair  -  2 x daily - 7 x weekly - 1 sets - 10 reps - standing single leg balance at the counter (try to not hold on)  - 2 x daily - 7 x weekly - 1  sets - 3 reps - 10 sec hold  12/12/23:  Bridge 10x 2 set  ASSESSMENT:  CLINICAL IMPRESSION: Session began with treadmill walking to work on endurance. Patient able to ambulate 2.5 minutes before exhibiting signs of fatigue such as decreased foot, and leaning onto UE on treadmill. Obstacle course performed, pt requires CGA-min A t/o for unsteadiness, mostly losing balance to R during SL activities. One instance of moderate LOB during marching on foam pad, requiring mod A. Challenge memory during 2nd set through limited verbal cueing/reminders. Further addressed balance with forward marching, backwards ambulation, and side stepping with round tidal tank. Patient with another moderate LOB drifting to the left when side stepping to R. Patient with continued emotional episodes and confusion t/o session. Pt would continue to benefit from skilled physical therapy for increased endurance, increased LE strength, and improved balance for improved QOL, improved gait and continued progress towards goals.   PN: Continued with focus on improving LE strength and stability.  Added tandem stance and SLS today.  Pt able to complete full 30" with tandem with only intermittent HHA.  Max single leg stance achieved 12-15" with either LE without UE assist.  Pt required less cues and had no episodes of frustration during session today.Did c/o knee pain with sit to stands which dissipated following activity.  Every session has noted improvement in functional and processing abilitiy.  Finished up with nustep to help improve activity tolerance. Pt will continue to benefit from skilled Physical Therapy services to address deficits/limitations in order to improve functional and QOL.     OBJECTIVE IMPAIRMENTS: Abnormal gait, decreased balance, decreased cognition, decreased knowledge of condition, decreased knowledge of use of DME, difficulty walking, decreased strength, increased fascial restrictions, impaired perceived functional  ability, and pain.   ACTIVITY LIMITATIONS: bending, standing, squatting, sleeping, stairs, transfers, and locomotion level  PARTICIPATION LIMITATIONS: meal prep, cleaning, laundry, driving, shopping, and community activity  PERSONAL FACTORS: 1 comorbidity: depression  are also affecting patient's functional outcome.   REHAB POTENTIAL: Good  CLINICAL DECISION MAKING: Evolving/moderate complexity  EVALUATION COMPLEXITY: Moderate   GOALS: Goals reviewed with patient? No  SHORT TERM GOALS: Target date: 02/19/2024 patient will be independent with initial HEP  Baseline: Goal status: IN PROGRESS  2.  Patient will improve SLS to 10" each leg to demonstrate improved functional balance Baseline:  Goal status: IN PROGRESS, ADJUSTED ON 01/09/24  LONG TERM GOALS: Target date: 03/04/24  Patient will be independent in self management strategies to improve quality of life and functional outcomes.  Baseline:  Goal status: IN PROGRESS  2.  Patient will report 50% improvement overall  Baseline:  Goal status: IN PROGRESS  3.  Patient will improve SLS to 15" each to demonstrate improved functional balance Baseline: 3" each Goal status: IN PROGRESS, ADJUSTED ON 01/09/24  4.  Patient will increase  bilateral leg MMT's to 4+ to 5/5 to allow navigation of steps without gait deviation or loss of balance  Baseline: see above Goal status: IN PROGRESS  5.  Patient will improve her TUG to 10" or less to demonstrate improved functional mobility and decreased fall risk Baseline: 16.29 sec Goal status: IN PROGRESS  6.  Patient will improve 5 times sit to stand score to 15 sec or less to demonstrate  improved functional mobility and increased leg strength.    Baseline: 27.97 Goal status: IN PROGRESS   PLAN:  PT FREQUENCY: 2x/week  PT DURATION: 4 weeks  PLANNED INTERVENTIONS: [id number]- PT Re-evaluation, 97110-Therapeutic exercises, 97530- Therapeutic activity, 97112- Neuromuscular  re-education, 97535- Self Care, 66440- Manual therapy, [phone]- Gait training, [phone]- Orthotic Fit/training, [phone]- Canalith repositioning, [id number]- Aquatic Therapy, [phone]- Splinting, Patient/Family education, Balance training, Stair training, Taping, Dry Needling, Joint mobilization, Joint manipulation, Spinal manipulation, Spinal mobilization, Scar mobilization, and DME instructions.   PLAN FOR NEXT SESSION:   Progress balance training, strength training of legs; progress HEP as able  12:30 PM, 02/12/24 Azusena Erlandson Powell-Butler, PT, DPT [facility] [place]

## 2024-02-16 ENCOUNTER — Ambulatory Visit (HOSPITAL_COMMUNITY)

## 2024-02-16 DIAGNOSIS — R29898 Other symptoms and signs involving the musculoskeletal system: Secondary | ICD-10-CM

## 2024-02-16 DIAGNOSIS — M25511 Pain in right shoulder: Secondary | ICD-10-CM | POA: Diagnosis not present

## 2024-02-16 DIAGNOSIS — R262 Difficulty in walking, not elsewhere classified: Secondary | ICD-10-CM | POA: Diagnosis not present

## 2024-02-16 DIAGNOSIS — R2681 Unsteadiness on feet: Secondary | ICD-10-CM | POA: Diagnosis not present

## 2024-02-16 DIAGNOSIS — M25611 Stiffness of right shoulder, not elsewhere classified: Secondary | ICD-10-CM | POA: Diagnosis not present

## 2024-02-16 NOTE — Therapy (Signed)
 OUTPATIENT PHYSICAL THERAPY LOWER EXTREMITY TREATMENT/Progress Note   Patient Name: Sheryl [name] MRN: [id number] DOB:Sep 24, 1956, 68 y.o., female Today's Date: 02/16/2024  END OF SESSION:  PT End of Session - 02/16/24 1608     Visit Number 14    Number of Visits 19    Date for PT Re-Evaluation 03/04/24    Authorization Type [employer] Dual Complete    Authorization Time Period no auth required    Progress Note Due on Visit 16    PT Start Time 1607    PT Stop Time 1645    PT Time Calculation (min) 38 min    Equipment Utilized During Treatment Gait belt    Activity Tolerance Patient tolerated treatment well    Behavior During Therapy WFL for tasks assessed/performed;Anxious                  Past Medical History:  Diagnosis Date   Chronic back pain 10/22/2011   Chronic neck pain 10/22/2011   Depression    HLD (hyperlipidemia)    Hypertension    Past Surgical History:  Procedure Laterality Date   BREAST CYST EXCISION     right   COLONOSCOPY     per patient, many years ago at [facility]   COLONOSCOPY WITH PROPOFOL  N/A 11/16/2021   Procedure: COLONOSCOPY WITH PROPOFOL ;  Surgeon: Suzette Espy, MD;  Location: [facility];  Service: Endoscopy;  Laterality: N/A;  9:15am   POLYPECTOMY  11/16/2021   Procedure: POLYPECTOMY;  Surgeon: Suzette Espy, MD;  Location: [facility];  Service: Endoscopy;;   TUBAL LIGATION     Patient Active Problem List   Diagnosis Date Noted   History of wheezing 11/15/2021   Dental abscess 11/15/2021   Colon cancer screening 09/03/2021   Pain, dental 10/12/2020   Hyperlipemia 09/07/2020   Borderline diabetes 09/06/2020   Bullous impetigo 07/26/2015   Mixed incontinence urge and stress 07/25/2015   Rotator cuff syndrome of left shoulder 12/19/2014   Difficulty walking 05/06/2012   Stiffness of vertebral column 05/06/2012   Decreased strength 05/06/2012   Class 3 obesity 04/16/2012   MDD (major depressive disorder) 04/16/2012    Essential hypertension, benign 04/16/2012   Back pain 04/16/2012   Cervical strain, acute 04/16/2012    PCP: Jonathon Neighbors, MD  REFERRING PROVIDER: Jonathon Neighbors, MD REFERRING DIAG: R26.81 (ICD-10-CM) - Unsteady gait  THERAPY DIAG:  Other symptoms and signs involving the musculoskeletal system  Difficulty in walking, not elsewhere classified  Unsteady gait  Rationale for Evaluation and Treatment: Rehabilitation  ONSET DATE: 1 year  SUBJECTIVE:   SUBJECTIVE STATEMENT: Pt reports she had a good weekend with her daughters and nieces. She reports she had to sit in the car a while and was okay. Just needed to stretch some after. Reports some mild pain on R hip area today, 3/10.   Eval:  Patient reports she has a hard time walking/ keeping her balance sometimes; it's not all the time.  She has to furniture walk in her home sometimes to keep her balance.  Patient seems occasionally confused during subjective today. She states she sometimes walks sideways; seems unsure of any cause of her unbalance.  States she is borderline DM, She does take blood pressure medication.  Some right side shoulder and leg pain.  Daughters or sisters usually come to her appts with her.  She becomes emotional during interview this morning. Right handed; having trouble with controlling right arm BP left arm 150/96 has  not taken her BP meds this morning; daughter arrives at appt. And states her daughters are concerned about her cognition and right side weakness, complaints of pain  PERTINENT HISTORY: Significant depression per her daughter  PAIN:  Are you having pain? Yes: NPRS scale: 8/10 Lt hip Pain location: Lt hip  Pain description: throbbing sharp pains Aggravating factors: leg worse at night Relieving factors: massage, muscle rub, BC powder  PRECAUTIONS: Fall     WEIGHT BEARING RESTRICTIONS: No  FALLS:  Has patient fallen in last 6 months? No  LIVING ENVIRONMENT: Lives with: lives with an adult  companion Lives in: House/apartment Stairs: Yes: External: 5 steps; on right going up, on left going up, and bilateral but cannot reach both Has following equipment at home: Single point cane and Quad cane small base  OCCUPATION: not working  PLOF: Independent  PATIENT GOALS: walk without worrying about falling  NEXT MD VISIT:   OBJECTIVE:  Note: Objective measures were completed at Evaluation unless otherwise noted.  DIAGNOSTIC FINDINGS: none  PATIENT SURVEYS:  LEFS 38/80 47.5%  01/09/24: 44 / 80 = 55.0 %  02/05/24: Lower Extremity Functional Score: 44 / 80 = 55.0 %  COGNITION: Overall cognitive status:  some difficulty answering more than yes/no questions; visibly frustrated and starts crying during subjective; improves when her daughter [name] arrives who states her family is concerned about her cognition      SENSATION: Appears intact at eval  EDEMA:  None noted  POSTURE: rounded shoulders, forward head, and heavy chested  PALPATION: Not specifically tested today  LOWER EXTREMITY MMT:  MMT Right eval Left eval R 02/05/24 L 02/05/24  Hip flexion 4 4- 4+ 4-  Hip extension 3- 3- 4- 3+  Hip abduction      Hip adduction      Hip internal rotation      Hip external rotation      Knee flexion 4 4-    Knee extension 4+ 4+    Ankle dorsiflexion 4- 5    Ankle plantarflexion      Ankle inversion      Ankle eversion       (Blank rows = not tested)    FUNCTIONAL TESTS:  5 times sit to stand: 27.97 sec using hands to assist up to standin Timed up and go (TUG): 16.29  no AD SLS 3" each leg  01/09/24: 5xSTS: 29 sec, no UE use. TUG: 16 sec  SLS: R: 5", L: 8"   02/05/24:  5xSTS: 32" w/ UE use, pt demo inc LLE pain  TUG: 13"  SLS: R: 9" , L: 8"  GAIT: Distance walked: 50 ft in clinic Assistive device utilized: None Level of assistance: SBA Comments: touches wall as she walks; sometimes tends to walk with small base of support  TREATMENT DATE:  02/16/24: Recumbent bike, seat 6, 5' Dynamic tandem balance, in // bars Forward tandem steps on aeromat, 10 ft x6, no UE support Side steps on aeromat, 83ftx4, no UE support Exercise Stations marked by blue cones with 56 ft distance between: 1st time: w/ verbal cues at each station: 3'19". Second time with no verbal cueing: 3'55"  Station 1: 10 Bicep curls with 5 lb. Bar  Station 2: 10 STS   Station 3: 10 ft side step with cylinder tidal tank  02/12/24: Treadmill walking to address endurance: 2.5 min, 0.5 mph,  Obstacle Course: 20 ft span, 4 times  Stepping over 6 inch orange obstacle Onto foam pad, 5 mini squats Weaving between 4 cones Step onto foam pad, 5 marches Stepping over orange obstacle Forward march, holding 10 lb. Round tidal tank, 19ft Backward ambulation, holding 10 lb. Round tidal tank, 38ft Side Steps, holding 10 lb. Round tidal tank, 68ftx2  02/05/24: Progress Note:  LEFS MMT  SLS 5xSTS   Standing Hip Ext, 2x10, varying UE support to challenge balance Standing hip Abd, 2x10, varying UE support to challenge balance LTR: 10x each side SKTC: 10" holds, 10x each leg    PATIENT EDUCATION:  Education details: Patient educated on exam findings, POC, scope of PT, HEP, and benefit of using a cane for ambulation outside of home. Person educated: Patient Education method: Explanation, Demonstration, and Handouts Education comprehension: verbalized understanding, returned demonstration, verbal cues required, and tactile cues required  HOME EXERCISE PROGRAM: Access Code: [email or web address] URL: [email or web address] Date: 02/16/2024 Prepared by: Virgia Griffins Powell-Butler  Exercises - Sit to Stand with Armchair  - 2 x daily - 7 x weekly - 1 sets - 10 reps - standing single leg balance at the counter (try to not hold on)  - 2 x daily - 7 x weekly - 1 sets -  3 reps - 10 sec hold - Supine Bridge  - 2 x daily - 7 x weekly - 2 sets - 10 reps - 5" hold - Standing Hip Extension with Counter Support  - 2 x daily - 7 x weekly - 2 sets - 10 reps - Standing Hip Abduction with Counter Support  - 2 x daily - 7 x weekly - 2 sets - 10 reps - Hooklying Single Knee to Chest Stretch with Towel  - 2 x daily - 7 x weekly - 2 sets - 10 reps - 10 hold - Lower Trunk Rotations  - 2 x daily - 7 x weekly - 2 sets - 10 reps  ASSESSMENT:  CLINICAL IMPRESSION: Session began with recumbent bike for general LE warm up. Followed with dynamic balance challenge with tandem forward steps and side steps on aeromat beams in parallel bars. Patient initially with BUE support for balance, progressing to no UE support w/ verbal cues for focusing on balance and not speed of task. CGA given for some expected unsteadiness at times. Overall good carryover with task. To challenge general strengthening, balance, and memory patient completes different exercise stations. Patient given visual and verbal demonstration of each station. During first run through, verbal cues are given and pt timed. With second run through, no verbal reminders are given for each stations tasked with patient requiring increased time to complete due to having to think about each task but no verbal reminders t/o. Patient with less emotional instances but tearful some t/o. Multiple rest breaks t/o as well. Pt would continue to benefit from skilled physical therapy for increased endurance, increased LE  strength, and improved balance for improved QOL, improved gait and continued progress towards goals.    PN: Continued with focus on improving LE strength and stability.  Added tandem stance and SLS today.  Pt able to complete full 30" with tandem with only intermittent HHA.  Max single leg stance achieved 12-15" with either LE without UE assist.  Pt required less cues and had no episodes of frustration during session today.Did c/o  knee pain with sit to stands which dissipated following activity.  Every session has noted improvement in functional and processing abilitiy.  Finished up with nustep to help improve activity tolerance. Pt will continue to benefit from skilled Physical Therapy services to address deficits/limitations in order to improve functional and QOL.     OBJECTIVE IMPAIRMENTS: Abnormal gait, decreased balance, decreased cognition, decreased knowledge of condition, decreased knowledge of use of DME, difficulty walking, decreased strength, increased fascial restrictions, impaired perceived functional ability, and pain.   ACTIVITY LIMITATIONS: bending, standing, squatting, sleeping, stairs, transfers, and locomotion level  PARTICIPATION LIMITATIONS: meal prep, cleaning, laundry, driving, shopping, and community activity  PERSONAL FACTORS: 1 comorbidity: depression  are also affecting patient's functional outcome.   REHAB POTENTIAL: Good  CLINICAL DECISION MAKING: Evolving/moderate complexity  EVALUATION COMPLEXITY: Moderate   GOALS: Goals reviewed with patient? No  SHORT TERM GOALS: Target date: 02/19/2024 patient will be independent with initial HEP  Baseline: Goal status: IN PROGRESS  2.  Patient will improve SLS to 10" each leg to demonstrate improved functional balance Baseline:  Goal status: IN PROGRESS, ADJUSTED ON 01/09/24  LONG TERM GOALS: Target date: 03/04/24  Patient will be independent in self management strategies to improve quality of life and functional outcomes.  Baseline:  Goal status: IN PROGRESS  2.  Patient will report 50% improvement overall  Baseline:  Goal status: IN PROGRESS  3.  Patient will improve SLS to 15" each to demonstrate improved functional balance Baseline: 3" each Goal status: IN PROGRESS, ADJUSTED ON 01/09/24  4.  Patient will increase  bilateral leg MMT's to 4+ to 5/5 to allow navigation of steps without gait deviation or loss of  balance  Baseline: see above Goal status: IN PROGRESS  5.  Patient will improve her TUG to 10" or less to demonstrate improved functional mobility and decreased fall risk Baseline: 16.29 sec Goal status: IN PROGRESS  6.  Patient will improve 5 times sit to stand score to 15 sec or less to demonstrate improved functional mobility and increased leg strength.    Baseline: 27.97 Goal status: IN PROGRESS   PLAN:  PT FREQUENCY: 2x/week  PT DURATION: 4 weeks  PLANNED INTERVENTIONS: 97164- PT Re-evaluation, 97110-Therapeutic exercises, 97530- Therapeutic activity, 97112- Neuromuscular re-education, 97535- Self Care, 16109- Manual therapy, [phone]- Gait training, [phone]- Orthotic Fit/training, [phone]- Canalith repositioning, [id number]- Aquatic Therapy, [phone]- Splinting, Patient/Family education, Balance training, Stair training, Taping, Dry Needling, Joint mobilization, Joint manipulation, Spinal manipulation, Spinal mobilization, Scar mobilization, and DME instructions.   PLAN FOR NEXT SESSION:   Progress balance training, strength training of legs; progress HEP as able  4:55 PM, 02/16/24 Esten Dollar Powell-Butler, PT, DPT [facility] [place]

## 2024-02-18 ENCOUNTER — Ambulatory Visit (HOSPITAL_COMMUNITY)

## 2024-02-18 DIAGNOSIS — M25611 Stiffness of right shoulder, not elsewhere classified: Secondary | ICD-10-CM | POA: Diagnosis not present

## 2024-02-18 DIAGNOSIS — R2681 Unsteadiness on feet: Secondary | ICD-10-CM | POA: Diagnosis not present

## 2024-02-18 DIAGNOSIS — R262 Difficulty in walking, not elsewhere classified: Secondary | ICD-10-CM | POA: Diagnosis not present

## 2024-02-18 DIAGNOSIS — R29898 Other symptoms and signs involving the musculoskeletal system: Secondary | ICD-10-CM

## 2024-02-18 DIAGNOSIS — M25511 Pain in right shoulder: Secondary | ICD-10-CM | POA: Diagnosis not present

## 2024-02-18 NOTE — Therapy (Addendum)
 OUTPATIENT PHYSICAL THERAPY LOWER EXTREMITY TREATMENT/Progress Note   Patient Name: Sheryl [name] MRN: [id number] DOB:1956/07/14, 68 y.o., female Today's Date: 02/18/2024  END OF SESSION:  PT End of Session - 02/18/24 1603     Visit Number 15    Number of Visits 19    Date for PT Re-Evaluation 03/04/24    Authorization Type [employer] Dual Complete    Authorization Time Period no auth required    Progress Note Due on Visit 16    PT Start Time 1605    PT Stop Time 1647    PT Time Calculation (min) 42 min    Equipment Utilized During Treatment Gait belt    Activity Tolerance Patient tolerated treatment well    Behavior During Therapy WFL for tasks assessed/performed;Anxious                  Past Medical History:  Diagnosis Date   Chronic back pain 10/22/2011   Chronic neck pain 10/22/2011   Depression    HLD (hyperlipidemia)    Hypertension    Past Surgical History:  Procedure Laterality Date   BREAST CYST EXCISION     right   COLONOSCOPY     per patient, many years ago at [facility]   COLONOSCOPY WITH PROPOFOL  N/A 11/16/2021   Procedure: COLONOSCOPY WITH PROPOFOL ;  Surgeon: Sheryl Espy, MD;  Location: [facility];  Service: Endoscopy;  Laterality: N/A;  9:15am   POLYPECTOMY  11/16/2021   Procedure: POLYPECTOMY;  Surgeon: Sheryl Espy, MD;  Location: [facility];  Service: Endoscopy;;   TUBAL LIGATION     Patient Active Problem List   Diagnosis Date Noted   History of wheezing 11/15/2021   Dental abscess 11/15/2021   Colon cancer screening 09/03/2021   Pain, dental 10/12/2020   Hyperlipemia 09/07/2020   Borderline diabetes 09/06/2020   Bullous impetigo 07/26/2015   Mixed incontinence urge and stress 07/25/2015   Rotator cuff syndrome of left shoulder 12/19/2014   Difficulty walking 05/06/2012   Stiffness of vertebral column 05/06/2012   Decreased strength 05/06/2012   Class 3 obesity 04/16/2012   MDD (major depressive disorder) 04/16/2012    Essential hypertension, benign 04/16/2012   Back pain 04/16/2012   Cervical strain, acute 04/16/2012    PCP: Sheryl Neighbors, MD  REFERRING PROVIDER: Jonathon Neighbors, MD REFERRING DIAG: R26.81 (ICD-10-CM) - Unsteady gait  THERAPY DIAG:  Difficulty in walking, not elsewhere classified  Unsteady gait  Other symptoms and signs involving the musculoskeletal system  Rationale for Evaluation and Treatment: Rehabilitation  ONSET DATE: 1 year  SUBJECTIVE:   SUBJECTIVE STATEMENT: Pt reports she is feeling good today. No pain. Reports she is still doing HEP.  When asked if they are challenging enough reports they are but she is open to receiving more.   Eval:  Patient reports she has a hard time walking/ keeping her balance sometimes; it's not all the time.  She has to furniture walk in her home sometimes to keep her balance.  Patient seems occasionally confused during subjective today. She states she sometimes walks sideways; seems unsure of any cause of her unbalance.  States she is borderline DM, She does take blood pressure medication.  Some right side shoulder and leg pain.  Daughters or sisters usually come to her appts with her.  She becomes emotional during interview this morning. Right handed; having trouble with controlling right arm BP left arm 150/96 has not taken her BP meds this morning; daughter arrives  at appt. And states her daughters are concerned about her cognition and right side weakness, complaints of pain  PERTINENT HISTORY: Significant depression per her daughter  PAIN:  Are you having pain? Yes: NPRS scale: 8/10 Lt hip Pain location: Lt hip  Pain description: throbbing sharp pains Aggravating factors: leg worse at night Relieving factors: massage, muscle rub, BC powder  PRECAUTIONS: Fall     WEIGHT BEARING RESTRICTIONS: No  FALLS:  Has patient fallen in last 6 months? No  LIVING ENVIRONMENT: Lives with: lives with an adult companion Lives in:  House/apartment Stairs: Yes: External: 5 steps; on right going up, on left going up, and bilateral but cannot reach both Has following equipment at home: Single point cane and Quad cane small base  OCCUPATION: not working  PLOF: Independent  PATIENT GOALS: walk without worrying about falling  NEXT MD VISIT:   OBJECTIVE:  Note: Objective measures were completed at Evaluation unless otherwise noted.  DIAGNOSTIC FINDINGS: none  PATIENT SURVEYS:  LEFS 38/80 47.5%  01/09/24: 44 / 80 = 55.0 %  02/05/24: Lower Extremity Functional Score: 44 / 80 = 55.0 %  COGNITION: Overall cognitive status:  some difficulty answering more than yes/no questions; visibly frustrated and starts crying during subjective; improves when her daughter Sheryl [name] arrives who states her family is concerned about her cognition      SENSATION: Appears intact at eval  EDEMA:  None noted  POSTURE: rounded shoulders, forward head, and heavy chested  PALPATION: Not specifically tested today  LOWER EXTREMITY MMT:  MMT Right eval Left eval R 02/05/24 L 02/05/24  Hip flexion 4 4- 4+ 4-  Hip extension 3- 3- 4- 3+  Hip abduction      Hip adduction      Hip internal rotation      Hip external rotation      Knee flexion 4 4-    Knee extension 4+ 4+    Ankle dorsiflexion 4- 5    Ankle plantarflexion      Ankle inversion      Ankle eversion       (Blank rows = not tested)    FUNCTIONAL TESTS:  5 times sit to stand: 27.97 sec using hands to assist up to standin Timed up and go (TUG): 16.29  no AD SLS 3" each leg  01/09/24: 5xSTS: 29 sec, no UE use. TUG: 16 sec  SLS: R: 5", L: 8"   02/05/24:  5xSTS: 32" w/ UE use, pt demo inc LLE pain  TUG: 13"  SLS: R: 9" , L: 8"  GAIT: Distance walked: 50 ft in clinic Assistive device utilized: None Level of assistance: SBA Comments: touches wall as she walks; sometimes tends to walk with small base of support                                                                                                                                 TREATMENT  DATE:  02/18/24: NuStep, level 3, 5 min, Seat Dynamic balance challenge in // bars: 4x, dec Ue support t/o  Stepping on 3 dyna disc, one foot to one disc  Stepping onto one aeromat  Ambulating to outside // bars to weave through 3 standing foam rolls Ball toss:  10x  10x with addition of STS  10x with STS, feet on foam  Monster walks with RTB around knees, 101ft x 2 Side Stepping, RTB at knees, 59ft x2  02/16/24: Recumbent bike, seat 6, 5' Dynamic tandem balance, in // bars Forward tandem steps on aeromat, 10 ft x6, no UE support Side steps on aeromat, 79ftx4, no UE support Exercise Stations marked by blue cones with 56 ft distance between: 1st time: w/ verbal cues at each station: 3'19". Second time with no verbal cueing: 3'55"  Station 1: 10 Bicep curls with 5 lb. Bar  Station 2: 10 STS   Station 3: 10 ft side step with cylinder tidal tank  02/12/24: Treadmill walking to address endurance: 2.5 min, 0.5 mph,  Obstacle Course: 20 ft span, 4 times  Stepping over 6 inch orange obstacle Onto foam pad, 5 mini squats Weaving between 4 cones Step onto foam pad, 5 marches Stepping over orange obstacle Forward march, holding 10 lb. Round tidal tank, 77ft Backward ambulation, holding 10 lb. Round tidal tank, 71ft Side Steps, holding 10 lb. Round tidal tank, 19ftx2     PATIENT EDUCATION:  Education details: Patient educated on exam findings, POC, scope of PT, HEP, and benefit of using a cane for ambulation outside of home. Person educated: Patient Education method: Explanation, Demonstration, and Handouts Education comprehension: verbalized understanding, returned demonstration, verbal cues required, and tactile cues required  HOME EXERCISE PROGRAM: Access Code: [email or web address] URL: [email or web address] Date: 02/16/2024 Prepared by: Virgia Griffins Powell-Butler  Exercises - Sit to Stand with  Armchair  - 2 x daily - 7 x weekly - 1 sets - 10 reps - standing single leg balance at the counter (try to not hold on)  - 2 x daily - 7 x weekly - 1 sets - 3 reps - 10 sec hold - Supine Bridge  - 2 x daily - 7 x weekly - 2 sets - 10 reps - 5" hold - Standing Hip Extension with Counter Support  - 2 x daily - 7 x weekly - 2 sets - 10 reps - Standing Hip Abduction with Counter Support  - 2 x daily - 7 x weekly - 2 sets - 10 reps - Hooklying Single Knee to Chest Stretch with Towel  - 2 x daily - 7 x weekly - 2 sets - 10 reps - 10 hold - Lower Trunk Rotations  - 2 x daily - 7 x weekly - 2 sets - 10 reps Access Code: [email or web address] URL: [email or web address] Date: 02/18/2024 Prepared by: Virgia Griffins Powell-Butler  Exercises - Sidestepping  - 2 x daily - 7 x weekly - 3 sets - 10 reps - Forward Monster Walks  - 2 x daily - 7 x weekly - 3 sets - 10 reps  ASSESSMENT:  CLINICAL IMPRESSION: Patient tolerated session well. General warm up on Nustep at level 3. Pt demonstrates min/mod LOB to R when standing from chair during beginning of session. Most of session spent focusing on static and dynamic balance. During obstacle course, pt unable to complete without some UE support t/o section performed inside parallel bars. Challenged patient memory t/o session with only explaining activity once. Pt with good carryover but becomes emotional  once at beginning when trying to remember activity. Mood improves with reassurance and encouragement. Pt agreeable to receiving Speech referral for memory strategies. Pt with good balance t/o ball toss. Pt demo another mod LOB to R when side stepping, requiring min A for safety. Added side steps and monster walks at counter for support to HEP. Pt would continue to benefit from skilled physical therapy for increased endurance, increased LE strength, and improved balance for improved QOL, improved gait and continued progress towards goals.    PN: Continued with focus on  improving LE strength and stability.  Added tandem stance and SLS today.  Pt able to complete full 30" with tandem with only intermittent HHA.  Max single leg stance achieved 12-15" with either LE without UE assist.  Pt required less cues and had no episodes of frustration during session today.Did c/o knee pain with sit to stands which dissipated following activity.  Every session has noted improvement in functional and processing abilitiy.  Finished up with nustep to help improve activity tolerance. Pt will continue to benefit from skilled Physical Therapy services to address deficits/limitations in order to improve functional and QOL.     OBJECTIVE IMPAIRMENTS: Abnormal gait, decreased balance, decreased cognition, decreased knowledge of condition, decreased knowledge of use of DME, difficulty walking, decreased strength, increased fascial restrictions, impaired perceived functional ability, and pain.   ACTIVITY LIMITATIONS: bending, standing, squatting, sleeping, stairs, transfers, and locomotion level  PARTICIPATION LIMITATIONS: meal prep, cleaning, laundry, driving, shopping, and community activity  PERSONAL FACTORS: 1 comorbidity: depression  are also affecting patient's functional outcome.   REHAB POTENTIAL: Good  CLINICAL DECISION MAKING: Evolving/moderate complexity  EVALUATION COMPLEXITY: Moderate   GOALS: Goals reviewed with patient? No  SHORT TERM GOALS: Target date: 02/19/2024 patient will be independent with initial HEP  Baseline: Goal status: IN PROGRESS  2.  Patient will improve SLS to 10" each leg to demonstrate improved functional balance Baseline:  Goal status: IN PROGRESS, ADJUSTED ON 01/09/24  LONG TERM GOALS: Target date: 03/04/24  Patient will be independent in self management strategies to improve quality of life and functional outcomes.  Baseline:  Goal status: IN PROGRESS  2.  Patient will report 50% improvement overall  Baseline:  Goal status: IN  PROGRESS  3.  Patient will improve SLS to 15" each to demonstrate improved functional balance Baseline: 3" each Goal status: IN PROGRESS, ADJUSTED ON 01/09/24  4.  Patient will increase  bilateral leg MMT's to 4+ to 5/5 to allow navigation of steps without gait deviation or loss of balance  Baseline: see above Goal status: IN PROGRESS  5.  Patient will improve her TUG to 10" or less to demonstrate improved functional mobility and decreased fall risk Baseline: 16.29 sec Goal status: IN PROGRESS  6.  Patient will improve 5 times sit to stand score to 15 sec or less to demonstrate improved functional mobility and increased leg strength.    Baseline: 27.97 Goal status: IN PROGRESS   PLAN:  PT FREQUENCY: 2x/week  PT DURATION: 4 weeks  PLANNED INTERVENTIONS: [id number]- PT Re-evaluation, 97110-Therapeutic exercises, 97530- Therapeutic activity, 97112- Neuromuscular re-education, 97535- Self Care, [id number]- Manual therapy, [phone]- Gait training, [phone]- Orthotic Fit/training, [phone]- Canalith repositioning, [id number]- Aquatic Therapy, [phone]- Splinting, Patient/Family education, Balance training, Stair training, Taping, Dry Needling, Joint mobilization, Joint manipulation, Spinal manipulation, Spinal mobilization, Scar mobilization, and DME instructions.   PLAN FOR NEXT SESSION:   Progress balance training, strength training of legs; progress HEP as able  4:58 PM, 02/18/24  Marysue Sola, PT, DPT [facility]

## 2024-02-24 ENCOUNTER — Ambulatory Visit (HOSPITAL_COMMUNITY): Attending: Family Medicine | Admitting: Physical Therapy

## 2024-02-24 DIAGNOSIS — R262 Difficulty in walking, not elsewhere classified: Secondary | ICD-10-CM | POA: Insufficient documentation

## 2024-02-24 DIAGNOSIS — R2681 Unsteadiness on feet: Secondary | ICD-10-CM | POA: Insufficient documentation

## 2024-02-24 DIAGNOSIS — R29898 Other symptoms and signs involving the musculoskeletal system: Secondary | ICD-10-CM | POA: Insufficient documentation

## 2024-02-24 NOTE — Therapy (Signed)
 OUTPATIENT PHYSICAL THERAPY LOWER EXTREMITY TREATMENT   Patient Name: Sheryl [name] MRN: [id number] DOB:09/05/1956, 68 y.o., female Today's Date: 02/24/2024  END OF SESSION:  PT End of Session - 02/24/24 0726     Visit Number 16    Number of Visits 19    Date for PT Re-Evaluation 03/04/24    Authorization Type [employer] Dual Complete    Authorization Time Period no auth required    Progress Note Due on Visit 16    PT Start Time 0722    PT Stop Time 0805    PT Time Calculation (min) 43 min    Equipment Utilized During Treatment Gait belt    Activity Tolerance Patient tolerated treatment well    Behavior During Therapy WFL for tasks assessed/performed;Anxious                  Past Medical History:  Diagnosis Date   Chronic back pain 10/22/2011   Chronic neck pain 10/22/2011   Depression    HLD (hyperlipidemia)    Hypertension    Past Surgical History:  Procedure Laterality Date   BREAST CYST EXCISION     right   COLONOSCOPY     per patient, many years ago at [facility]   COLONOSCOPY WITH PROPOFOL  N/A 11/16/2021   Procedure: COLONOSCOPY WITH PROPOFOL ;  Surgeon: Sheryl Espy, MD;  Location: [facility];  Service: Endoscopy;  Laterality: N/A;  9:15am   POLYPECTOMY  11/16/2021   Procedure: POLYPECTOMY;  Surgeon: Sheryl Espy, MD;  Location: [facility];  Service: Endoscopy;;   TUBAL LIGATION     Patient Active Problem List   Diagnosis Date Noted   History of wheezing 11/15/2021   Dental abscess 11/15/2021   Colon cancer screening 09/03/2021   Pain, dental 10/12/2020   Hyperlipemia 09/07/2020   Borderline diabetes 09/06/2020   Bullous impetigo 07/26/2015   Mixed incontinence urge and stress 07/25/2015   Rotator cuff syndrome of left shoulder 12/19/2014   Difficulty walking 05/06/2012   Stiffness of vertebral column 05/06/2012   Decreased strength 05/06/2012   Class 3 obesity 04/16/2012   MDD (major depressive disorder) 04/16/2012   Essential  hypertension, benign 04/16/2012   Back pain 04/16/2012   Cervical strain, acute 04/16/2012    PCP: Sheryl Neighbors, MD  REFERRING PROVIDER: Jonathon Neighbors, MD REFERRING DIAG: R26.81 (ICD-10-CM) - Unsteady gait  THERAPY DIAG:  Difficulty in walking, not elsewhere classified  Unsteady gait  Other symptoms and signs involving the musculoskeletal system  Rationale for Evaluation and Treatment: Rehabilitation  ONSET DATE: 1 year  SUBJECTIVE:   SUBJECTIVE STATEMENT: Pt reports she feels better than she has in a long time.  Currently doing well and everything has improved.    Eval:  Patient reports she has a hard time walking/ keeping her balance sometimes; it's not all the time.  She has to furniture walk in her home sometimes to keep her balance.  Patient seems occasionally confused during subjective today. She states she sometimes walks sideways; seems unsure of any cause of her unbalance.  States she is borderline DM, She does take blood pressure medication.  Some right side shoulder and leg pain.  Daughters or sisters usually come to her appts with her.  She becomes emotional during interview this morning. Right handed; having trouble with controlling right arm BP left arm 150/96 has not taken her BP meds this morning; daughter arrives at appt. And states her daughters are concerned about her cognition and right  side weakness, complaints of pain  PERTINENT HISTORY: Significant depression per her daughter  PAIN:  Are you having pain? Yes: NPRS scale: 8/10 Lt hip Pain location: Lt hip  Pain description: throbbing sharp pains Aggravating factors: leg worse at night Relieving factors: massage, muscle rub, BC powder  PRECAUTIONS: Fall     WEIGHT BEARING RESTRICTIONS: No  FALLS:  Has patient fallen in last 6 months? No  LIVING ENVIRONMENT: Lives with: lives with an adult companion Lives in: House/apartment Stairs: Yes: External: 5 steps; on right going up, on left going up, and  bilateral but cannot reach both Has following equipment at home: Single point cane and Quad cane small base  OCCUPATION: not working  PLOF: Independent  PATIENT GOALS: walk without worrying about falling  NEXT MD VISIT:   OBJECTIVE:  Note: Objective measures were completed at Evaluation unless otherwise noted.  DIAGNOSTIC FINDINGS: none  PATIENT SURVEYS:  LEFS 38/80 47.5%  01/09/24: 44 / 80 = 55.0 %  02/05/24: Lower Extremity Functional Score: 44 / 80 = 55.0 %  COGNITION: Overall cognitive status:  some difficulty answering more than yes/no questions; visibly frustrated and starts crying during subjective; improves when her daughter Sheryl [name] arrives who states her family is concerned about her cognition      SENSATION: Appears intact at eval  EDEMA:  None noted  POSTURE: rounded shoulders, forward head, and heavy chested  PALPATION: Not specifically tested today  LOWER EXTREMITY MMT:  MMT Right eval Left eval R 02/05/24 L 02/05/24  Hip flexion 4 4- 4+ 4-  Hip extension 3- 3- 4- 3+  Hip abduction      Hip adduction      Hip internal rotation      Hip external rotation      Knee flexion 4 4-    Knee extension 4+ 4+    Ankle dorsiflexion 4- 5    Ankle plantarflexion      Ankle inversion      Ankle eversion       (Blank rows = not tested)    FUNCTIONAL TESTS:  5 times sit to stand: 27.97 sec using hands to assist up to standin Timed up and go (TUG): 16.29  no AD SLS 3" each leg  01/09/24: 5xSTS: 29 sec, no UE use. TUG: 16 sec  SLS: R: 5", L: 8"   02/05/24:  5xSTS: 32" w/ UE use, pt demo inc LLE pain  TUG: 13"  SLS: R: 9" , L: 8"  GAIT: Distance walked: 50 ft in clinic Assistive device utilized: None Level of assistance: SBA Comments: touches wall as she walks; sometimes tends to walk with small base of support                                                                                                                                TREATMENT  DATE:  02/24/24 Nustep level 3 seat 5, 5 minutes UE/LE Standing:  vectors 10X5" each LE  Tandem stance on aeromat with 1# cane flexion 10X each LE lead  Tandem stance on aeromat with 1# cane rotation 5X each LE lead Tandem gait on aeromat balance beam 2RT with CGA of therapist STS with tidal tank 5X from standard chair  02/18/24: NuStep, level 3, 5 min, Seat Dynamic balance challenge in // bars: 4x, dec Ue support t/o  Stepping on 3 dyna disc, one foot to one disc  Stepping onto one aeromat  Ambulating to outside // bars to weave through 3 standing foam rolls Ball toss:  10x  10x with addition of STS  10x with STS, feet on foam  Monster walks with RTB around knees, 35ft x 2 Side Stepping, RTB at knees, 44ft x2  02/16/24: Recumbent bike, seat 6, 5' Dynamic tandem balance, in // bars Forward tandem steps on aeromat, 10 ft x6, no UE support Side steps on aeromat, 27ftx4, no UE support Exercise Stations marked by blue cones with 56 ft distance between: 1st time: w/ verbal cues at each station: 3'19". Second time with no verbal cueing: 3'55"  Station 1: 10 Bicep curls with 5 lb. Bar  Station 2: 10 STS   Station 3: 10 ft side step with cylinder tidal tank   PATIENT EDUCATION:  Education details: Patient educated on exam findings, POC, scope of PT, HEP, and benefit of using a cane for ambulation outside of home. Person educated: Patient Education method: Explanation, Demonstration, and Handouts Education comprehension: verbalized understanding, returned demonstration, verbal cues required, and tactile cues required  HOME EXERCISE PROGRAM: Access Code: [email or web address] URL: [email or web address] Date: 02/16/2024 Prepared by: Virgia Griffins Powell-Butler  Exercises - Sit to Stand with Armchair  - 2 x daily - 7 x weekly - 1 sets - 10 reps - standing single leg balance at the counter (try to not hold on)  - 2 x daily - 7 x weekly - 1 sets - 3 reps - 10 sec hold - Supine Bridge  - 2 x  daily - 7 x weekly - 2 sets - 10 reps - 5" hold - Standing Hip Extension with Counter Support  - 2 x daily - 7 x weekly - 2 sets - 10 reps - Standing Hip Abduction with Counter Support  - 2 x daily - 7 x weekly - 2 sets - 10 reps - Hooklying Single Knee to Chest Stretch with Towel  - 2 x daily - 7 x weekly - 2 sets - 10 reps - 10 hold - Lower Trunk Rotations  - 2 x daily - 7 x weekly - 2 sets - 10 reps Access Code: [email or web address] URL: [email or web address] Date: 02/18/2024 Prepared by: Virgia Griffins Powell-Butler  Exercises - Sidestepping  - 2 x daily - 7 x weekly - 3 sets - 10 reps - Forward Monster Walks  - 2 x daily - 7 x weekly - 3 sets - 10 reps  ASSESSMENT:  CLINICAL IMPRESSION: Session focused today on balancle/stability.  Increased challenge to UE tasks while balancing on aeromat in tandem.  Rotation more challenging than flexion, however both with instability and self correction.  Balance beam completed using aeromat with CGA from therapist.  No extreme LOB but again with instabilites.  Sit to stand was challenging with assist from therapist to fully stand several times and leaning backward upon standing.  Patient tolerated session well without any incidents of becoming tearful with improved processing noted as well.  Pt pointed this out verbalizing how proud she is of how  far she has come. Pt will continue to benefit from physical therapist in order to increase endurance, LE strength, and improved balance for improved QOL, improved gait and continued progress towards goals.  PN: Continued with focus on improving LE strength and stability.  Added tandem stance and SLS today.  Pt able to complete full 30" with tandem with only intermittent HHA.  Max single leg stance achieved 12-15" with either LE without UE assist.  Pt required less cues and had no episodes of frustration during session today.Did c/o knee pain with sit to stands which dissipated following activity.  Every session has  noted improvement in functional and processing abilitiy.  Finished up with nustep to help improve activity tolerance. Pt will continue to benefit from skilled Physical Therapy services to address deficits/limitations in order to improve functional and QOL.     OBJECTIVE IMPAIRMENTS: Abnormal gait, decreased balance, decreased cognition, decreased knowledge of condition, decreased knowledge of use of DME, difficulty walking, decreased strength, increased fascial restrictions, impaired perceived functional ability, and pain.   ACTIVITY LIMITATIONS: bending, standing, squatting, sleeping, stairs, transfers, and locomotion level  PARTICIPATION LIMITATIONS: meal prep, cleaning, laundry, driving, shopping, and community activity  PERSONAL FACTORS: 1 comorbidity: depression  are also affecting patient's functional outcome.   REHAB POTENTIAL: Good  CLINICAL DECISION MAKING: Evolving/moderate complexity  EVALUATION COMPLEXITY: Moderate   GOALS: Goals reviewed with patient? No  SHORT TERM GOALS: Target date: 02/19/2024 patient will be independent with initial HEP  Baseline: Goal status: IN PROGRESS  2.  Patient will improve SLS to 10" each leg to demonstrate improved functional balance Baseline:  Goal status: IN PROGRESS, ADJUSTED ON 01/09/24  LONG TERM GOALS: Target date: 03/04/24  Patient will be independent in self management strategies to improve quality of life and functional outcomes.  Baseline:  Goal status: IN PROGRESS  2.  Patient will report 50% improvement overall  Baseline:  Goal status: IN PROGRESS  3.  Patient will improve SLS to 15" each to demonstrate improved functional balance Baseline: 3" each Goal status: IN PROGRESS, ADJUSTED ON 01/09/24  4.  Patient will increase  bilateral leg MMT's to 4+ to 5/5 to allow navigation of steps without gait deviation or loss of balance  Baseline: see above Goal status: IN PROGRESS  5.  Patient will improve her TUG to 10" or  less to demonstrate improved functional mobility and decreased fall risk Baseline: 16.29 sec Goal status: IN PROGRESS  6.  Patient will improve 5 times sit to stand score to 15 sec or less to demonstrate improved functional mobility and increased leg strength.    Baseline: 27.97 Goal status: IN PROGRESS   PLAN:  PT FREQUENCY: 2x/week  PT DURATION: 4 weeks  PLANNED INTERVENTIONS: 97164- PT Re-evaluation, 97110-Therapeutic exercises, 97530- Therapeutic activity, 97112- Neuromuscular re-education, 97535- Self Care, 16109- Manual therapy, [phone]- Gait training, [phone]- Orthotic Fit/training, [phone]- Canalith repositioning, [id number]- Aquatic Therapy, [phone]- Splinting, Patient/Family education, Balance training, Stair training, Taping, Dry Needling, Joint mobilization, Joint manipulation, Spinal manipulation, Spinal mobilization, Scar mobilization, and DME instructions.   PLAN FOR NEXT SESSION:   Progress balance training, strength training of legs; progress HEP as able  8:56 AM, 02/24/24 Lorenso Romance, PTA/CLT [facility] Ph: [phone]

## 2024-02-26 ENCOUNTER — Encounter (HOSPITAL_COMMUNITY): Admitting: Physical Therapy

## 2024-03-02 ENCOUNTER — Encounter (HOSPITAL_COMMUNITY): Admitting: Physical Therapy

## 2024-03-04 ENCOUNTER — Ambulatory Visit (HOSPITAL_COMMUNITY)

## 2024-03-04 ENCOUNTER — Encounter (HOSPITAL_COMMUNITY): Payer: Self-pay

## 2024-03-04 DIAGNOSIS — R262 Difficulty in walking, not elsewhere classified: Secondary | ICD-10-CM

## 2024-03-04 DIAGNOSIS — R2681 Unsteadiness on feet: Secondary | ICD-10-CM | POA: Diagnosis not present

## 2024-03-04 DIAGNOSIS — R29898 Other symptoms and signs involving the musculoskeletal system: Secondary | ICD-10-CM | POA: Diagnosis not present

## 2024-03-04 NOTE — Therapy (Addendum)
 OUTPATIENT PHYSICAL THERAPY LOWER EXTREMITY TREATMENT   PHYSICAL THERAPY DISCHARGE SUMMARY  Visits from Start of Care: 15  Current functional level related to goals / functional outcomes: independent   Remaining deficits: Decreased LE strength, risk for falls according to TUG   Education / Equipment: Pt educated on importance of independence with HEP focused on LE strengthening and balance. Pt also educated on community resources to have access to more gym equipment at the senior center.   Patient agrees to discharge. Patient goals were partially met. Patient is being discharged due to meeting majority of therapy goals and demonstrating good progress towards remaining therapy goals.   Patient Name: Sheryl [name] MRN: [id number] DOB:Feb 26, 1956, 68 y.o., female Today's Date: 03/04/2024  END OF SESSION:  PT End of Session - 03/04/24 0923     Visit Number 17    Number of Visits 19    Date for PT Re-Evaluation 03/04/24    Authorization Type [employer] Dual Complete    Authorization Time Period no auth required    Progress Note Due on Visit 16    PT Start Time 0845    PT Stop Time 0910    PT Time Calculation (min) 25 min    Activity Tolerance Patient tolerated treatment well    Behavior During Therapy [facility] for tasks assessed/performed;Anxious                   Past Medical History:  Diagnosis Date   Chronic back pain 10/22/2011   Chronic neck pain 10/22/2011   Depression    HLD (hyperlipidemia)    Hypertension    Past Surgical History:  Procedure Laterality Date   BREAST CYST EXCISION     right   COLONOSCOPY     per patient, many years ago at [facility]   COLONOSCOPY WITH PROPOFOL  N/A 11/16/2021   Procedure: COLONOSCOPY WITH PROPOFOL ;  Surgeon: Suzette Espy, MD;  [facility];  Service: Endoscopy;  Laterality: N/A;  9:15am   POLYPECTOMY  11/16/2021   Procedure: POLYPECTOMY;  Surgeon: Suzette Espy, MD;  [facility];  Service:  Endoscopy;;   TUBAL LIGATION     Patient Active Problem List   Diagnosis Date Noted   History of wheezing 11/15/2021   Dental abscess 11/15/2021   Colon cancer screening 09/03/2021   Pain, dental 10/12/2020   Hyperlipemia 09/07/2020   Borderline diabetes 09/06/2020   Bullous impetigo 07/26/2015   Mixed incontinence urge and stress 07/25/2015   Rotator cuff syndrome of left shoulder 12/19/2014   Difficulty walking 05/06/2012   Stiffness of vertebral column 05/06/2012   Decreased strength 05/06/2012   Class 3 obesity 04/16/2012   MDD (major depressive disorder) 04/16/2012   Essential hypertension, benign 04/16/2012   Back pain 04/16/2012   Cervical strain, acute 04/16/2012    PCP: Sheryl Neighbors, MD  REFERRING PROVIDER: Jonathon Neighbors, MD REFERRING DIAG: R26.81 (ICD-10-CM) - Unsteady gait  THERAPY DIAG:  Difficulty in walking, not elsewhere classified  Unsteady gait  Rationale for Evaluation and Treatment: Rehabilitation  ONSET DATE: 1 year  SUBJECTIVE:   SUBJECTIVE STATEMENT: Pt reports she feels better. Pt states she has gotten much better since the start of therapy, no pain in left hip now.     Eval:  Patient reports she has a hard time walking/ keeping her balance sometimes; it's not all the time.  She has to furniture walk in her home sometimes to keep her balance.  Patient seems occasionally confused during  subjective today. She states she sometimes walks sideways; seems unsure of any cause of her unbalance.  States she is borderline DM, She does take blood pressure medication.  Some right side shoulder and leg pain.  Daughters or sisters usually come to her appts with her.  She becomes emotional during interview this morning. Right handed; having trouble with controlling right arm BP left arm 150/96 has not taken her BP meds this morning; daughter arrives at appt. And states her daughters are concerned about her cognition and right side weakness, complaints of  pain  PERTINENT HISTORY: Significant depression per her daughter  PAIN:  Are you having pain? Yes: NPRS scale: 8/10 Lt hip Pain location: Lt hip  Pain description: throbbing sharp pains Aggravating factors: leg worse at night Relieving factors: massage, muscle rub, BC powder  PRECAUTIONS: Fall     WEIGHT BEARING RESTRICTIONS: No  FALLS:  Has patient fallen in last 6 months? No  LIVING ENVIRONMENT: Lives with: lives with an adult companion Lives in: House/apartment Stairs: Yes: External: 5 steps; on right going up, on left going up, and bilateral but cannot reach both Has following equipment at home: Single point cane and Quad cane small base  OCCUPATION: not working  PLOF: Independent  PATIENT GOALS: walk without worrying about falling  NEXT MD VISIT:   OBJECTIVE:  Note: Objective measures were completed at Evaluation unless otherwise noted.  DIAGNOSTIC FINDINGS: none  PATIENT SURVEYS:  LEFS 38/80 47.5%  01/09/24: 44 / 80 = 55.0 %  02/05/24: Lower Extremity Functional Score: 44 / 80 = 55.0 %  COGNITION: Overall cognitive status: some difficulty answering more than yes/no questions; visibly frustrated and starts crying during subjective; improves when her daughter Sheryl [name] arrives who states her family is concerned about her cognition     SENSATION: Appears intact at eval  EDEMA:  None noted  POSTURE: rounded shoulders, forward head, and heavy chested  PALPATION: Not specifically tested today  LOWER EXTREMITY MMT:  MMT Right eval Left eval R 02/05/24 L 02/05/24 R 03/04/24 L 03/04/24  Hip flexion 4 4- 4+ 4- 4+ 4-  Hip extension 3- 3- 4- 3+ 4 4-  Hip abduction        Hip adduction        Hip internal rotation        Hip external rotation        Knee flexion 4 4-      Knee extension 4+ 4+      Ankle dorsiflexion 4- 5      Ankle plantarflexion        Ankle inversion        Ankle eversion         (Blank rows = not tested)    FUNCTIONAL TESTS:  5  times sit to stand: 27.97 sec using hands to assist up to standin Timed up and go (TUG): 16.29 no AD SLS 3" each leg  01/09/24: 5xSTS: 29 sec, no UE use. TUG: 16 sec  SLS: R: 5", L: 8"   02/05/24:  5xSTS: 32" w/ UE use, pt demo inc LLE pain  TUG: 13"  SLS: R: 9" , L: 8"  03/04/24:  SLS L: 14.14s R: 19.61s  5TSTS: 20.93 seconds, Little pain on left LE, on last rep  TUG: 14.27 seconds  GAIT: Distance walked: 50 ft in clinic Assistive device utilized: None Level of assistance: SBA Comments: touches wall as she walks; sometimes tends to walk with small base of support  TREATMENT DATE:  03/04/2024  Reviewed goals and tracked progress, strength testing, functional assessments. Therapeutic Activity: Pt educated on importance of HEP and community resources available at local senior center. Pt also educated on updated HEP for improved functional mobility and functional balance.    02/24/24 Nustep level 3 seat 5, 5 minutes UE/LE Standing:  vectors 10X5" each LE  Tandem stance on aeromat with 1# cane flexion 10X each LE lead  Tandem stance on aeromat with 1# cane rotation 5X each LE lead Tandem gait on aeromat balance beam 2RT with CGA of therapist STS with tidal tank 5X from standard chair  02/18/24: NuStep, level 3, 5 min, Seat Dynamic balance challenge in // bars: 4x, dec Ue support t/o  Stepping on 3 dyna disc, one foot to one disc  Stepping onto one aeromat  Ambulating to outside // bars to weave through 3 standing foam rolls Ball toss:  10x  10x with addition of STS  10x with STS, feet on foam  Monster walks with RTB around knees, 28ft x 2 Side Stepping, RTB at knees, 68ft x2  02/16/24: Recumbent bike, seat 6, 5' Dynamic tandem balance, in // bars Forward tandem steps on aeromat, 10 ft x6, no UE support Side steps on aeromat, 71ftx4, no UE  support Exercise Stations marked by blue cones with 56 ft distance between: 1st time: w/ verbal cues at each station: 3'19". Second time with no verbal cueing: 3'55"  Station 1: 10 Bicep curls with 5 lb. Bar  Station 2: 10 STS   Station 3: 10 ft side step with cylinder tidal tank   PATIENT EDUCATION:  Education details: Patient educated on exam findings, POC, scope of PT, HEP, and benefit of using a cane for ambulation outside of home. Person educated: Patient Education method: Explanation, Demonstration, and Handouts Education comprehension: verbalized understanding, returned demonstration, verbal cues required, and tactile cues required  HOME EXERCISE PROGRAM: Access Code: [email or web address] URL: [email or web address] Date: 02/16/2024 Prepared by: Virgia Griffins Powell-Butler  Exercises - Sit to Stand with Armchair  - 2 x daily - 7 x weekly - 1 sets - 10 reps - standing single leg balance at the counter (try to not hold on)  - 2 x daily - 7 x weekly - 1 sets - 3 reps - 10 sec hold - Supine Bridge  - 2 x daily - 7 x weekly - 2 sets - 10 reps - 5" hold - Standing Hip Extension with Counter Support  - 2 x daily - 7 x weekly - 2 sets - 10 reps - Standing Hip Abduction with Counter Support  - 2 x daily - 7 x weekly - 2 sets - 10 reps - Hooklying Single Knee to Chest Stretch with Towel  - 2 x daily - 7 x weekly - 2 sets - 10 reps - 10 hold - Lower Trunk Rotations  - 2 x daily - 7 x weekly - 2 sets - 10 reps Access Code: [email or web address] URL: [email or web address] Date: 02/18/2024 Prepared by: Virgia Griffins Powell-Butler  Exercises - Sidestepping  - 2 x daily - 7 x weekly - 3 sets - 10 reps - Forward Monster Walks  - 2 x daily - 7 x weekly - 3 sets - 10 reps   Access Code: [email or web address] URL: [email or web address] Date: 03/04/2024 Prepared by: Armond Bertin  Exercises - Supine Bridge  - 1 x daily - 7 x weekly - 3 sets - 10 reps - Clamshell  - 1 x daily - 7 x weekly -  3 sets - 10  reps - Supine Lower Trunk Rotation  - 1 x daily - 7 x weekly - 3 sets - 10 reps - Sidelying Hip Abduction  - 1 x daily - 7 x weekly - 3 sets - 10 reps - Supine March  - 1 x daily - 7 x weekly - 3 sets - 10 reps - Supine Active Straight Leg Raise  - 1 x daily - 7 x weekly - 3 sets - 10 reps - Sit to Stand  - 1 x daily - 7 x weekly - 3 sets - 10 reps - Heel Raises with Counter Support  - 1 x daily - 7 x weekly - 3 sets - 10 reps - Single Leg Stance with Support  - 1 x daily - 7 x weekly - 3 sets - 10 reps  ASSESSMENT:  CLINICAL IMPRESSION: Patient continues to demonstrate improvements in LE strength, gait quality and balance. Patient also demonstrates improved endurance with aerobic based exercise. Patient has met 5/8 goals with good progress towards remaining therapy goals. Patient to be discharged today due to meeting majority of goals and demonstrating good independence with HEP. Pt would continue to benefit from prescribed HEP for increased endurance with ambulation, increased LE strength, and improved balance for improved quality of life, improved independence with gait training and continued progress towards therapy goals.    OBJECTIVE IMPAIRMENTS: Abnormal gait, decreased balance, decreased cognition, decreased knowledge of condition, decreased knowledge of use of DME, difficulty walking, decreased strength, increased fascial restrictions, impaired perceived functional ability, and pain.   ACTIVITY LIMITATIONS: bending, standing, squatting, sleeping, stairs, transfers, and locomotion level  PARTICIPATION LIMITATIONS: meal prep, cleaning, laundry, driving, shopping, and community activity  PERSONAL FACTORS: 1 comorbidity: depression are also affecting patient's functional outcome.   REHAB POTENTIAL: Good  CLINICAL DECISION MAKING: Evolving/moderate complexity  EVALUATION COMPLEXITY: Moderate   GOALS: Goals reviewed with patient? No  SHORT TERM GOALS: Target date:  02/19/2024 patient will be independent with initial HEP  Baseline: Goal status: MET  2.  Patient will improve SLS to 10" each leg to demonstrate improved functional balance Baseline:  Goal status: MET  LONG TERM GOALS: Target date: 03/04/24  Patient will be independent in self management strategies to improve quality of life and functional outcomes.  Baseline:  Goal status: MET  2.  Patient will report 50% improvement overall  Baseline: Pt states she can't answer but she is better. Goal status: MET  3.  Patient will improve SLS to 15" each to demonstrate improved functional balance Baseline: 3" each Goal status: PARTIALLY MET  4.  Patient will increase  bilateral leg MMT's to 4+ to 5/5 to allow navigation of steps without gait deviation or loss of balance  Baseline: see above Goal status: IN PROGRESS  5.  Patient will improve her TUG to 10" or less to demonstrate improved functional mobility and decreased fall risk Baseline: 16.29 sec Goal status: IN PROGRESS  6.  Patient will improve 5 times sit to stand score to 15 sec or less to demonstrate improved functional mobility and increased leg strength.    Baseline: 27.97 Goal status: IN PROGRESS   PLAN:  PT FREQUENCY: 2x/week  PT DURATION: 4 weeks  PLANNED INTERVENTIONS: 97164- PT Re-evaluation, 97110-Therapeutic exercises, 97530- Therapeutic activity, 97112- Neuromuscular re-education, 97535- Self Care, 30865- Manual therapy, [phone]- Gait training, [phone]- Orthotic Fit/training, [phone]- Canalith repositioning, [id number]- Aquatic Therapy, [phone]- Splinting, Patient/Family education, Balance training, Stair training, Taping, Dry Needling, Joint mobilization, Joint  manipulation, Spinal manipulation, Spinal mobilization, Scar mobilization, and DME instructions.   PLAN FOR NEXT SESSION:   Discharged  Armond Bertin, PT, DPT [facility] Office: [phone] 9:25 AM, 03/04/24

## 2024-04-14 ENCOUNTER — Emergency Department (HOSPITAL_COMMUNITY)
Admission: EM | Admit: 2024-04-14 | Discharge: 2024-04-14 | Disposition: A | Discharge status: Home / Self Care | Attending: Emergency Medicine | Admitting: Emergency Medicine

## 2024-04-14 ENCOUNTER — Other Ambulatory Visit: Payer: Self-pay

## 2024-04-14 ENCOUNTER — Encounter (HOSPITAL_COMMUNITY): Payer: Self-pay

## 2024-04-14 DIAGNOSIS — I10 Essential (primary) hypertension: Secondary | ICD-10-CM | POA: Insufficient documentation

## 2024-04-14 DIAGNOSIS — T63441A Toxic effect of venom of bees, accidental (unintentional), initial encounter: Secondary | ICD-10-CM | POA: Insufficient documentation

## 2024-04-14 DIAGNOSIS — Z79899 Other long term (current) drug therapy: Secondary | ICD-10-CM | POA: Insufficient documentation

## 2024-04-14 DIAGNOSIS — Z7982 Long term (current) use of aspirin: Secondary | ICD-10-CM | POA: Diagnosis not present

## 2024-04-14 DIAGNOSIS — Z91030 Bee allergy status: Secondary | ICD-10-CM

## 2024-04-14 MED ORDER — DIPHENHYDRAMINE HCL 25 MG PO CAPS
50.0000 mg | ORAL_CAPSULE | Freq: Once | ORAL | Status: AC
  Administered 2024-04-14: 50 mg via ORAL
  Filled 2024-04-14: qty 2

## 2024-04-14 NOTE — Discharge Instructions (Addendum)
 We evaluated you for your bee sting.  You may have a minor allergy to bee venom.  Please try to avoid bee stings, if you get stung you can take Benadryl  for this.  You can take 50 mg of Benadryl  every 6 hours as needed for itching or hives.  If you have any new or worsening symptoms like swelling to your face, difficulty breathing, lightheadedness, fainting, vomiting, abdominal pain, or or other concerning symptoms with the bee sting, please return immediately to the emergency department.

## 2024-04-14 NOTE — ED Triage Notes (Addendum)
 Pt to ED from home with c/o bee sting to right arm/hand. Pt has swelling that she says has come down some after taking allergy pill, but now feels funny in her veins

## 2024-04-15 NOTE — ED Provider Notes (Signed)
 [place] EMERGENCY DEPARTMENT AT [facility] Provider Note  CSN: [id number] Arrival date & time: 04/14/24 1957  Chief Complaint(s) Insect Bite  HPI Sheryl [name] is a 68 y.o. female with PMH HTN, HLD presenting with bee sting. Reports she was moving a plant, saw some bees that stung her. Reports 3 stings, 2 to RUE and 1 left breast. Has itching and redness around sting sites. Reports only stung once before by bee. Felt some lightheadedness and jitteriness and was concerned for a more severe reaction and sought care. Took a montelukast as she thought this might help. No wheezing, vomiting, abdominal pain, diarrhea, LOC, facial swelling, trouble swallowing, fever   Past Medical History Past Medical History:  Diagnosis Date   Chronic back pain 10/22/2011   Chronic neck pain 10/22/2011   Depression    HLD (hyperlipidemia)    Hypertension    Patient Active Problem List   Diagnosis Date Noted   History of wheezing 11/15/2021   Dental abscess 11/15/2021   Colon cancer screening 09/03/2021   Pain, dental 10/12/2020   Hyperlipemia 09/07/2020   Borderline diabetes 09/06/2020   Bullous impetigo 07/26/2015   Mixed incontinence urge and stress 07/25/2015   Rotator cuff syndrome of left shoulder 12/19/2014   Difficulty walking 05/06/2012   Stiffness of vertebral column 05/06/2012   Decreased strength 05/06/2012   Class 3 obesity 04/16/2012   MDD (major depressive disorder) 04/16/2012   Essential hypertension, benign 04/16/2012   Back pain 04/16/2012   Cervical strain, acute 04/16/2012   Home Medication(s) Prior to Admission medications   Medication Sig Start Date End Date Taking? Authorizing Provider  albuterol  (VENTOLIN  HFA) 108 (90 Base) MCG/ACT inhaler Inhale 2 puffs into the lungs every 4 (four) hours as needed for wheezing or shortness of breath. 12/08/23   Stuart Vernell Norris, PA-C  Aspirin-Salicylamide-Caffeine [facility] HEADACHE POWDER PO) Take 2 packets by mouth  daily as needed (headaches).    [provider]  atorvastatin  (LIPITOR) 10 MG tablet Take 1 tablet (10 mg total) by mouth at bedtime. 04/27/21   Chandra Harlene LABOR, NP  cetirizine  (ZYRTEC  ALLERGY) 10 MG tablet Take 1 tablet (10 mg total) by mouth daily. 01/21/23   Stuart Vernell Norris, PA-C  clotrimazole -betamethasone  (LOTRISONE ) cream Apply 1 application topically 2 (two) times daily. Patient taking differently: Apply 1 application  topically 2 (two) times daily as needed (skin irritation/rash). 04/27/21   Chandra Harlene LABOR, NP  erythromycin  ophthalmic ointment Place a 1/2 inch ribbon of ointment into the left lower eyelid BID prn. 06/15/22   Stuart Vernell Norris, PA-C  escitalopram  (LEXAPRO ) 10 MG tablet TAKE 1 TABLET BY MOUTH DAILY AS DIRECTED FOR MOO. *PT NEEDS NEW PCP* 02/11/22   Duanne Butler DASEN, MD  fluticasone  (FLONASE ) 50 MCG/ACT nasal spray Place 2 sprays into both nostrils daily. 03/05/23   Leath-Warren, Etta PARAS, NP  guaiFENesin  (MUCINEX ) 600 MG 12 hr tablet Take 1 tablet (600 mg total) by mouth 2 (two) times daily as needed. 01/21/23   Stuart Vernell Norris, PA-C  hydrochlorothiazide  (HYDRODIURIL ) 25 MG tablet TAKE 1 TABLET BY MOUTH IN THE MORNING FOR BLOOD PRESSURE 11/12/21   Chandra Harlene LABOR, NP  lidocaine  (XYLOCAINE ) 2 % solution Use as directed 5 mLs in the mouth or throat as needed for mouth pain. Gargle and spit 5mL every 6 hours as needed for throat pain or discomfort. 03/05/23   Leath-Warren, Etta PARAS, NP  mirabegron  ER (MYRBETRIQ ) 25 MG TB24 tablet TAKE 1 TABLET BY MOUTH AT  BEDTIME FOR YOUR BLADDER 09/06/20   Bari Theodoro FALCON, MD  promethazine -dextromethorphan (PROMETHAZINE -DM) 6.25-15 MG/5ML syrup Take 5 mLs by mouth 4 (four) times daily as needed for cough. 03/05/23   Leath-Warren, Etta PARAS, NP  promethazine -dextromethorphan (PROMETHAZINE -DM) 6.25-15 MG/5ML syrup Take 5 mLs by mouth 4 (four) times daily as needed. 12/08/23   Stuart Vernell Norris, PA-C                                                                                                                                     Past Surgical History Past Surgical History:  Procedure Laterality Date   BREAST CYST EXCISION     right   COLONOSCOPY     per patient, many years ago at [facility]   COLONOSCOPY WITH PROPOFOL  N/A 11/16/2021   Procedure: COLONOSCOPY WITH PROPOFOL ;  Surgeon: Shaaron Lamar HERO, MD;  [facility];  Service: Endoscopy;  Laterality: N/A;  9:15am   POLYPECTOMY  11/16/2021   Procedure: POLYPECTOMY;  Surgeon: Shaaron Lamar HERO, MD;  [facility];  Service: Endoscopy;;   TUBAL LIGATION     Family History Family History  Problem Relation Age of Onset   Hypertension Mother    Depression Mother    Hypertension Sister    Hypertension Sister    Colon cancer Neg Hx     Social History Social History   Tobacco Use   Smoking status: Never   Smokeless tobacco: Never  Vaping Use   Vaping status: Never Used  Substance Use Topics   Alcohol use: No    Alcohol/week: 0.0 standard drinks of alcohol   Drug use: No   Allergies Patient has no known allergies.  Review of Systems Review of Systems  All other systems reviewed and are negative.   Physical Exam Vital Signs  I have reviewed the triage vital signs BP 123/72 (BP Location: Right Arm)   Pulse 71   Temp 98.6 F (37 C) (Oral)   Resp 16   Ht 5' 2 (1.575 m)   Wt 117 kg   LMP 12/20/2011   SpO2 96%   BMI 47.18 kg/m  Physical Exam Vitals and nursing note reviewed.  Constitutional:      General: She is not in acute distress.    Appearance: She is well-developed.  HENT:     Head: Normocephalic and atraumatic.     Mouth/Throat:     Mouth: Mucous membranes are moist.     Comments: No oropharyngeal swelling  Eyes:     Pupils: Pupils are equal, round, and reactive to light.    Cardiovascular:     Rate and Rhythm: Normal rate and regular rhythm.     Heart sounds: No murmur heard. Pulmonary:      Effort: Pulmonary effort is normal. No respiratory distress.     Breath sounds: Normal breath sounds. No wheezing.  Abdominal:     General: Abdomen is  flat.     Palpations: Abdomen is soft.     Tenderness: There is no abdominal tenderness.   Musculoskeletal:        General: No tenderness.     Right lower leg: No edema.     Left lower leg: No edema.   Skin:    General: Skin is warm and dry.     Comments: 3 sites of possible bee sting, 1. Dorsal right hand, 2. Volar forearm, 3. Left axilla, mild surrounding erythema/induration. No obvious retained bee stinger    Neurological:     General: No focal deficit present.     Mental Status: She is alert. Mental status is at baseline.   Psychiatric:        Mood and Affect: Mood normal.        Behavior: Behavior normal.     ED Results and Treatments Labs (all labs ordered are listed, but only abnormal results are displayed) Labs Reviewed - No data to display                                                                                                                        Radiology No results found.  Pertinent labs & imaging results that were available during my care of the patient were reviewed by me and considered in my medical decision making (see MDM for details).  Medications Ordered in ED Medications  diphenhydrAMINE  (BENADRYL ) capsule 50 mg (50 mg Oral Given 04/14/24 2303)                                                                                                                                     Procedures Procedures  (including critical care time)  Medical Decision Making / ED Course   MDM:  68 y/o with bee sting  Seems consistent with localized hypersensitivity reaction. Reports itching, may have mild bee sting allergy. No signs of anaphylaxis. Symptoms have improved since onset. Don't think patient needs epipen rx with mild symptoms. Will discharge patient to home. All questions answered. Patient  comfortable with plan of discharge. Return precautions discussed with patient and specified on the after visit summary.       Additional history obtained: -Additional history obtained from family -External records from outside source obtained and reviewed including: Chart review including previous notes, labs, imaging, consultation notes including prior notes     Medicines ordered and prescription drug  management: Meds ordered this encounter  Medications   diphenhydrAMINE  (BENADRYL ) capsule 50 mg    -I have reviewed the patients home medicines and have made adjustments as needed  Social Determinants of Health:  Diagnosis or treatment significantly limited by social determinants of health: obesity   Reevaluation: After the interventions noted above, I reevaluated the patient and found that their symptoms have improved  Co morbidities that complicate the patient evaluation  Past Medical History:  Diagnosis Date   Chronic back pain 10/22/2011   Chronic neck pain 10/22/2011   Depression    HLD (hyperlipidemia)    Hypertension       Dispostion: Disposition decision including need for hospitalization was considered, and patient discharged from emergency department.    Final Clinical Impression(s) / ED Diagnoses Final diagnoses:  Bee sting allergy     This chart was dictated using voice recognition software.  Despite best efforts to proofread,  errors can occur which can change the documentation meaning.    Francesca Elsie CROME, MD 04/15/24 [phone]

## 2024-04-28 ENCOUNTER — Ambulatory Visit (HOSPITAL_COMMUNITY): Attending: Family Medicine | Admitting: Speech Pathology

## 2024-04-28 ENCOUNTER — Other Ambulatory Visit: Payer: Self-pay

## 2024-04-28 ENCOUNTER — Encounter (HOSPITAL_COMMUNITY): Payer: Self-pay | Admitting: Speech Pathology

## 2024-04-28 DIAGNOSIS — R41841 Cognitive communication deficit: Secondary | ICD-10-CM | POA: Insufficient documentation

## 2024-04-28 NOTE — Therapy (Addendum)
 OUTPATIENT SPEECH LANGUAGE PATHOLOGY EVALUATION   Patient Name: Sheryl [name] MRN: [id number] DOB:1956/05/15, 68 y.o., female Today's Date: 04/28/2024  PCP: Benjamine Aland, MD REFERRING PROVIDER: Benjamine Aland, MD  END OF SESSION:  End of Session - 04/28/24 1234     Visit Number 1    Number of Visits 9    Date for SLP Re-Evaluation 06/10/24    Authorization Type [employer] DUAL COMPLETE    SLP Start Time 0850    SLP Stop Time  0930    SLP Time Calculation (min) 40 min    Activity Tolerance Patient tolerated treatment well          Past Medical History:  Diagnosis Date   Chronic back pain 10/22/2011   Chronic neck pain 10/22/2011   Depression    HLD (hyperlipidemia)    Hypertension    Past Surgical History:  Procedure Laterality Date   BREAST CYST EXCISION     right   COLONOSCOPY     per patient, many years ago at [facility]   COLONOSCOPY WITH PROPOFOL  N/A 11/16/2021   Procedure: COLONOSCOPY WITH PROPOFOL ;  Surgeon: Shaaron Lamar HERO, MD;  Location: [facility];  Service: Endoscopy;  Laterality: N/A;  9:15am   POLYPECTOMY  11/16/2021   Procedure: POLYPECTOMY;  Surgeon: Shaaron Lamar HERO, MD;  Location: [facility];  Service: Endoscopy;;   TUBAL LIGATION     Patient Active Problem List   Diagnosis Date Noted   History of wheezing 11/15/2021   Dental abscess 11/15/2021   Colon cancer screening 09/03/2021   Pain, dental 10/12/2020   Hyperlipemia 09/07/2020   Borderline diabetes 09/06/2020   Bullous impetigo 07/26/2015   Mixed incontinence urge and stress 07/25/2015   Rotator cuff syndrome of left shoulder 12/19/2014   Difficulty walking 05/06/2012   Stiffness of vertebral column 05/06/2012   Decreased strength 05/06/2012   Class 3 obesity 04/16/2012   MDD (major depressive disorder) 04/16/2012   Essential hypertension, benign 04/16/2012   Back pain 04/16/2012   Cervical strain, acute 04/16/2012    ONSET DATE: 02/18/2024   REFERRING DIAG: R41.841  Cognitive communication changes  THERAPY DIAG:  Cognitive communication deficit  Rationale for Evaluation and Treatment: Rehabilitation  SUBJECTIVE:   SUBJECTIVE STATEMENT: I get mixed up in my words.  Pt accompanied by: self and family member  PERTINENT HISTORY: Sheryl [name] is a 68 yo female who was referred for a cognitive linguistic evaluation by Benjamine Aland, MD due to Pt reports of changes in memory and ability to communicate her thoughts.    PAIN:  Are you having pain? No  FALLS: Has patient fallen in last 6 months?  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  DIAGNOSTIC FINDINGS: N/A; Pt has not had a brain MRI on record  COGNITION: Overall cognitive status: Impaired Areas of impairment:  Attention: Impaired: Sustained Memory: Impaired: Working Psychologist, educational function: Impaired: Organization and Planning Functional deficits: Pt's daughter helps her manage appointments and finances  AUDITORY COMPREHENSION: Overall auditory comprehension: Appears intact YES/NO questions: Appears intact Following directions: Appears intact Conversation: Moderately Complex Interfering components: working Research scientist (life sciences): repetition/stressing words  READING COMPREHENSION: Not assessed  EXPRESSION: verbal  VERBAL EXPRESSION: Level of generative/spontaneous  verbalization: conversation Automatic speech: name: intact and social response: intact  Repetition: Appears intact Naming: Responsive: 76-100%, Confrontation: 76-100%, and Divergent: 76-100% Pragmatics: Appears intact Comments: Pt reports mixed up in my words Interfering  components: attention Effective technique: semantic cues Non-verbal means of communication: N/A  WRITTEN EXPRESSION: Dominant hand: right Written expression: Not tested  MOTOR SPEECH: Overall motor speech: Appears intact Level of impairment: N/A Respiration: WNL Phonation: normal Resonance: WFL Articulation: Appears intact Intelligibility: Intelligible Motor planning: Appears intact Motor speech errors: N/A Interfering components: N/A Effective technique: N/A  ORAL MOTOR EXAMINATION: Overall status: WFL Comments:   STANDARDIZED ASSESSMENTS: SLUMS: 18/30  [facility] SLUMS Examination Orientation  3/3  Numeric Problem Solving  3/3  Memory  3/5  Attention 1/2  Thought Organization 2/3  Clock Drawing 2/4  Visuospatial Skills               2/2  Short Story Recall  2/8  Total  18/30     Scoring  High School Education  Less than High School Education   Normal  27-30 25-30  Mild Neurocognitive Disorder 21-26 20-24  Dementia  1-20 1-19                                                                                                                        TREATMENT DATE: 04/28/24 Evaluation only completed this date  PATIENT EDUCATION: Education details: Plan for short term SLP therapy to address cognitive communication deficits Person educated: Patient and Child(ren) Education method: Explanation Education comprehension: verbalized understanding   GOALS: Goals reviewed with patient? Yes  SHORT TERM GOALS: Target date: 06/11/2024  Pt will implement memory strategies in functional therapy activities with 90% acc with mi/mod cues. Baseline: 70% Goal status: INITIAL  2.  Pt will complete moderate-level thought organization and planning activities with 90% acc and mi/mod assist. Baseline: mod assist Goal status: INITIAL  3.  Pt will implement word-finding strategies with 90% accuracy when unable to verbalize desired word in conversation/functional tasks with mi/mod  assist. Baseline: 75% Goal status: INITIAL  4.  Pt will describe objects and pictures by providing at least three salient features as judged by clinician with 90% acc when provided mi/mod cues.  Baseline: mod/max Goal status: INITIAL  LONG TERM GOALS: Same as short term goals  ASSESSMENT:  CLINICAL IMPRESSION: Patient is a 68 y.o. female who was seen today for a cognitive linguistic evaluation. She presents with moderate cognitive linguistic deficits characterized by deficits in working and prospective memory, attention, word retrieval, and expressive speech. Pt has not had an MRI and onset is unknown for deficits. She also reports h/o anxiety and depression which could be impacting her cognitive communication abilities. She requires assist from family to manage medication, finances, and communication with novel communication partners. Pt was seen for PT/OT earlier this year. Recommend that Pt f/u with PCP or neurologist to consider brain imaging as family has questions about Pt's changes in cognition.   OBJECTIVE IMPAIRMENTS: include attention, memory,  executive functioning, and expressive language. These impairments are limiting patient from managing medications, managing appointments, managing finances, and effectively communicating at home and in community. Factors affecting potential to achieve goals and functional outcome are previous level of function. Patient will benefit from skilled SLP services to address above impairments and improve overall function.  REHAB POTENTIAL: Fair time post onset  PLAN:  SLP FREQUENCY: 2x/week  SLP DURATION: 4 weeks  PLANNED INTERVENTIONS: Cueing hierachy, Cognitive reorganization, Internal/external aids, Functional tasks, Multimodal communication approach, SLP instruction and feedback, Compensatory strategies, Patient/family education, [phone] Treatment of speech (30 or 45 min) , and [id number]- Speech Eval Sound Prod, Artic, Phon, Eval Compre,  Express   Thank you,  Lamar Candy, CCC-SLP [phone]  Sayeed Weatherall, CCC-SLP 04/28/2024, 12:36 PM  Addendum: SPEECH THERAPY DISCHARGE SUMMARY  Visits from Start of Care: 1  Current functional level related to goals / functional outcomes: See above   Remaining deficits: No change, did not return for therapy   Education / Equipment: N/A   Patient agrees to discharge. Patient goals were not met. Patient is being discharged due to not returning since the last visit.SABRA

## 2024-05-03 DIAGNOSIS — E1169 Type 2 diabetes mellitus with other specified complication: Secondary | ICD-10-CM | POA: Diagnosis not present

## 2024-05-03 DIAGNOSIS — I10 Essential (primary) hypertension: Secondary | ICD-10-CM | POA: Diagnosis not present

## 2024-05-31 ENCOUNTER — Other Ambulatory Visit (HOSPITAL_COMMUNITY): Payer: Self-pay | Admitting: Family Medicine

## 2024-05-31 DIAGNOSIS — Z1231 Encounter for screening mammogram for malignant neoplasm of breast: Secondary | ICD-10-CM

## 2024-06-07 ENCOUNTER — Ambulatory Visit (HOSPITAL_COMMUNITY)
Admission: RE | Admit: 2024-06-07 | Discharge: 2024-06-07 | Disposition: A | Discharge status: Home / Self Care | Source: Ambulatory Visit | Attending: Family Medicine | Admitting: Family Medicine

## 2024-06-07 DIAGNOSIS — Z1231 Encounter for screening mammogram for malignant neoplasm of breast: Secondary | ICD-10-CM | POA: Insufficient documentation

## 2024-08-02 DIAGNOSIS — E1165 Type 2 diabetes mellitus with hyperglycemia: Secondary | ICD-10-CM | POA: Diagnosis not present

## 2024-08-02 DIAGNOSIS — I10 Essential (primary) hypertension: Secondary | ICD-10-CM | POA: Diagnosis not present

## 2024-08-16 ENCOUNTER — Encounter: Payer: Self-pay | Admitting: *Deleted

## 2024-08-16 NOTE — Progress Notes (Signed)
 Sheryl [name]                                          MRN: [id number]   08/16/2024   The [facility] Quality Team Specialist reviewed this patient medical record for the purposes of chart review for care gap closure. The following were reviewed: chart review for care gap closure-glycemic status assessment and kidney health evaluation for diabetes:eGFR  and uACR.    [facility] Quality Team
